# Patient Record
Sex: Male | Born: 1940 | Race: Black or African American | Hispanic: No | Marital: Married | Smoking: Former smoker
Health system: Southern US, Community
[De-identification: ages and names within clinical notes are randomized; demographics above are authoritative.]

## PROBLEM LIST (undated history)

## (undated) DIAGNOSIS — C189 Malignant neoplasm of colon, unspecified: Secondary | ICD-10-CM

## (undated) DIAGNOSIS — C719 Malignant neoplasm of brain, unspecified: Secondary | ICD-10-CM

## (undated) DIAGNOSIS — C3490 Malignant neoplasm of unspecified part of unspecified bronchus or lung: Secondary | ICD-10-CM

## (undated) DIAGNOSIS — C61 Malignant neoplasm of prostate: Secondary | ICD-10-CM

## (undated) DIAGNOSIS — C229 Malignant neoplasm of liver, not specified as primary or secondary: Secondary | ICD-10-CM

## (undated) DIAGNOSIS — N200 Calculus of kidney: Secondary | ICD-10-CM

## (undated) HISTORY — DX: Calculus of kidney: N20.0

## (undated) HISTORY — DX: Malignant neoplasm of liver, not specified as primary or secondary: C22.9

## (undated) HISTORY — DX: Malignant neoplasm of brain, unspecified: C71.9

## (undated) HISTORY — DX: Malignant neoplasm of colon, unspecified: C18.9

## (undated) HISTORY — DX: Malignant neoplasm of prostate: C61

## (undated) HISTORY — DX: Malignant neoplasm of unspecified part of unspecified bronchus or lung: C34.90

---

## 1973-02-15 DIAGNOSIS — N200 Calculus of kidney: Secondary | ICD-10-CM

## 1973-02-15 HISTORY — DX: Calculus of kidney: N20.0

## 1973-02-15 HISTORY — PX: OTHER SURGICAL HISTORY: SHX169

## 2003-02-16 DIAGNOSIS — C61 Malignant neoplasm of prostate: Secondary | ICD-10-CM

## 2003-02-16 HISTORY — DX: Malignant neoplasm of prostate: C61

## 2003-02-16 HISTORY — PX: PROSTATE SURGERY: SHX751

## 2005-07-19 ENCOUNTER — Emergency Department: Payer: Self-pay | Admitting: Emergency Medicine

## 2007-02-01 ENCOUNTER — Ambulatory Visit: Payer: Self-pay | Admitting: Orthopedic Surgery

## 2007-03-10 ENCOUNTER — Ambulatory Visit: Payer: Self-pay | Admitting: Internal Medicine

## 2007-05-02 ENCOUNTER — Ambulatory Visit: Payer: Self-pay | Admitting: Urology

## 2007-07-05 ENCOUNTER — Ambulatory Visit: Payer: Self-pay | Admitting: Internal Medicine

## 2012-02-16 DIAGNOSIS — C3490 Malignant neoplasm of unspecified part of unspecified bronchus or lung: Secondary | ICD-10-CM

## 2012-02-16 HISTORY — DX: Malignant neoplasm of unspecified part of unspecified bronchus or lung: C34.90

## 2012-02-16 HISTORY — PX: PORTACATH PLACEMENT: SHX2246

## 2012-10-02 ENCOUNTER — Ambulatory Visit: Payer: Self-pay | Admitting: Internal Medicine

## 2012-10-05 ENCOUNTER — Ambulatory Visit: Payer: Self-pay | Admitting: Internal Medicine

## 2012-10-13 ENCOUNTER — Ambulatory Visit: Payer: Self-pay | Admitting: Internal Medicine

## 2012-10-25 ENCOUNTER — Ambulatory Visit: Payer: Self-pay | Admitting: Oncology

## 2012-10-30 ENCOUNTER — Ambulatory Visit: Payer: Self-pay | Admitting: Oncology

## 2012-10-30 ENCOUNTER — Ambulatory Visit: Payer: Self-pay | Admitting: Internal Medicine

## 2012-10-30 LAB — COMPREHENSIVE METABOLIC PANEL
Albumin: 3 g/dL — ABNORMAL LOW (ref 3.4–5.0)
Alkaline Phosphatase: 102 U/L (ref 50–136)
Bilirubin,Total: 0.3 mg/dL (ref 0.2–1.0)
Chloride: 103 mmol/L (ref 98–107)
Co2: 31 mmol/L (ref 21–32)
EGFR (African American): >60
Glucose: 121 mg/dL — ABNORMAL HIGH (ref 65–99)
Potassium: 3.5 mmol/L (ref 3.5–5.1)
SGOT(AST): 26 U/L (ref 15–37)
SGPT (ALT): 18 U/L (ref 12–78)
Sodium: 138 mmol/L (ref 136–145)

## 2012-10-30 LAB — CBC CANCER CENTER
Basophil #: 0 x10 3/mm (ref 0.0–0.1)
Basophil %: 0.5 %
Eosinophil #: 0.1 x10 3/mm (ref 0.0–0.7)
HCT: 33.6 % — ABNORMAL LOW (ref 40.0–52.0)
Lymphocyte %: 27 %
MCH: 27.2 pg (ref 26.0–34.0)
MCHC: 32.9 g/dL (ref 32.0–36.0)
MCV: 83 fL (ref 80–100)
Monocyte %: 8.7 %
Platelet: 407 x10 3/mm (ref 150–440)

## 2012-10-30 LAB — LACTATE DEHYDROGENASE: LDH: 411 U/L — ABNORMAL HIGH (ref 85–241)

## 2012-11-07 ENCOUNTER — Ambulatory Visit: Payer: Self-pay | Admitting: Vascular Surgery

## 2012-11-13 LAB — CBC CANCER CENTER
Basophil #: 0 x10 3/mm (ref 0.0–0.1)
Basophil %: 0.8 %
Eosinophil #: 0.1 x10 3/mm (ref 0.0–0.7)
HCT: 32.3 % — ABNORMAL LOW (ref 40.0–52.0)
HGB: 10.7 g/dL — ABNORMAL LOW (ref 13.0–18.0)
Lymphocyte %: 26.5 %
MCH: 27.3 pg (ref 26.0–34.0)
MCHC: 33.2 g/dL (ref 32.0–36.0)
Monocyte #: 0.1 x10 3/mm — ABNORMAL LOW (ref 0.2–1.0)
Monocyte %: 1.5 %
Neutrophil #: 3.8 x10 3/mm (ref 1.4–6.5)
Neutrophil %: 69.2 %
Platelet: 300 x10 3/mm (ref 150–440)
RBC: 3.94 10*6/uL — ABNORMAL LOW (ref 4.40–5.90)
WBC: 5.4 x10 3/mm (ref 3.8–10.6)

## 2012-11-13 LAB — CREATININE, SERUM: Creatinine: 0.79 mg/dL (ref 0.60–1.30)

## 2012-11-15 ENCOUNTER — Ambulatory Visit: Payer: Self-pay | Admitting: Oncology

## 2012-11-20 LAB — CBC CANCER CENTER
Eosinophil #: 0 x10 3/mm (ref 0.0–0.7)
Eosinophil %: 0.3 %
HCT: 30.1 % — ABNORMAL LOW (ref 40.0–52.0)
HGB: 10.1 g/dL — ABNORMAL LOW (ref 13.0–18.0)
Lymphocyte #: 1.4 x10 3/mm (ref 1.0–3.6)
Lymphocyte %: 61.8 %
MCHC: 33.7 g/dL (ref 32.0–36.0)
Monocyte %: 13 %
Platelet: 280 x10 3/mm (ref 150–440)
RDW: 14.1 % (ref 11.5–14.5)

## 2012-11-27 LAB — COMPREHENSIVE METABOLIC PANEL
Anion Gap: 11 (ref 7–16)
Bilirubin,Total: 0.3 mg/dL (ref 0.2–1.0)
Calcium, Total: 9.2 mg/dL (ref 8.5–10.1)
Co2: 27 mmol/L (ref 21–32)
EGFR (African American): >60
Glucose: 131 mg/dL — ABNORMAL HIGH (ref 65–99)
Osmolality: 275 (ref 275–301)
SGPT (ALT): 26 U/L (ref 12–78)
Sodium: 136 mmol/L (ref 136–145)

## 2012-11-27 LAB — CBC CANCER CENTER
Basophil #: 0 x10 3/mm (ref 0.0–0.1)
Basophil %: 0.1 %
HCT: 33.9 % — ABNORMAL LOW (ref 40.0–52.0)
HGB: 11.5 g/dL — ABNORMAL LOW (ref 13.0–18.0)
Lymphocyte %: 14.2 %
MCHC: 33.8 g/dL (ref 32.0–36.0)
MCV: 84 fL (ref 80–100)
Monocyte #: 0.4 x10 3/mm (ref 0.2–1.0)
Neutrophil #: 5.7 x10 3/mm (ref 1.4–6.5)
Platelet: 391 x10 3/mm (ref 150–440)
RDW: 15.1 % — ABNORMAL HIGH (ref 11.5–14.5)

## 2012-12-04 LAB — CBC CANCER CENTER
Basophil #: 0 x10 3/mm (ref 0.0–0.1)
HCT: 31.4 % — ABNORMAL LOW (ref 40.0–52.0)
HGB: 10.6 g/dL — ABNORMAL LOW (ref 13.0–18.0)
Lymphocyte #: 0.5 x10 3/mm — ABNORMAL LOW (ref 1.0–3.6)
MCH: 28.7 pg (ref 26.0–34.0)
MCHC: 33.7 g/dL (ref 32.0–36.0)
Monocyte #: 0.1 x10 3/mm — ABNORMAL LOW (ref 0.2–1.0)
Neutrophil %: 89 %
RBC: 3.68 10*6/uL — ABNORMAL LOW (ref 4.40–5.90)
RDW: 16 % — ABNORMAL HIGH (ref 11.5–14.5)
WBC: 6.2 x10 3/mm (ref 3.8–10.6)

## 2012-12-11 LAB — CBC CANCER CENTER
Basophil %: 0.5 %
Eosinophil %: 0.4 %
HGB: 10.6 g/dL — ABNORMAL LOW (ref 13.0–18.0)
Lymphocyte #: 0.4 x10 3/mm — ABNORMAL LOW (ref 1.0–3.6)
MCH: 28.8 pg (ref 26.0–34.0)
MCHC: 33.6 g/dL (ref 32.0–36.0)
Monocyte #: 0.2 x10 3/mm (ref 0.2–1.0)
Monocyte %: 12.4 %
Neutrophil %: 58.2 %
RBC: 3.67 10*6/uL — ABNORMAL LOW (ref 4.40–5.90)
RDW: 17.2 % — ABNORMAL HIGH (ref 11.5–14.5)
WBC: 1.5 x10 3/mm — CL (ref 3.8–10.6)

## 2012-12-16 ENCOUNTER — Ambulatory Visit: Payer: Self-pay | Admitting: Oncology

## 2012-12-27 LAB — COMPREHENSIVE METABOLIC PANEL
Albumin: 3.1 g/dL — ABNORMAL LOW (ref 3.4–5.0)
Alkaline Phosphatase: 92 U/L (ref 50–136)
Bilirubin,Total: 0.3 mg/dL (ref 0.2–1.0)
Creatinine: 1.01 mg/dL (ref 0.60–1.30)
EGFR (African American): >60
EGFR (Non-African Amer.): >60
Potassium: 4.3 mmol/L (ref 3.5–5.1)
SGOT(AST): 23 U/L (ref 15–37)
SGPT (ALT): 29 U/L (ref 12–78)
Sodium: 142 mmol/L (ref 136–145)

## 2012-12-27 LAB — CBC CANCER CENTER
Basophil #: 0 x10 3/mm (ref 0.0–0.1)
Eosinophil #: 0 x10 3/mm (ref 0.0–0.7)
Eosinophil %: 0.6 %
HCT: 33.4 % — ABNORMAL LOW (ref 40.0–52.0)
HGB: 11.1 g/dL — ABNORMAL LOW (ref 13.0–18.0)
Lymphocyte #: 0.5 x10 3/mm — ABNORMAL LOW (ref 1.0–3.6)
Lymphocyte %: 11.3 %
MCH: 28.7 pg (ref 26.0–34.0)
MCHC: 33.1 g/dL (ref 32.0–36.0)
Monocyte #: 0.9 x10 3/mm (ref 0.2–1.0)
Monocyte %: 19.6 %
Neutrophil #: 3.3 x10 3/mm (ref 1.4–6.5)
Neutrophil %: 68.2 %
RBC: 3.86 10*6/uL — ABNORMAL LOW (ref 4.40–5.90)
RDW: 20.7 % — ABNORMAL HIGH (ref 11.5–14.5)
WBC: 4.8 x10 3/mm (ref 3.8–10.6)

## 2013-01-03 LAB — CBC CANCER CENTER
Basophil %: 0.5 %
Eosinophil #: 0.1 x10 3/mm (ref 0.0–0.7)
HCT: 36 % — ABNORMAL LOW (ref 40.0–52.0)
Lymphocyte #: 0.5 x10 3/mm — ABNORMAL LOW (ref 1.0–3.6)
MCH: 28.1 pg (ref 26.0–34.0)
MCHC: 33 g/dL (ref 32.0–36.0)
MCV: 85 fL (ref 80–100)
Monocyte %: 2.2 %
Neutrophil #: 3.2 x10 3/mm (ref 1.4–6.5)
Neutrophil %: 83.5 %
Platelet: 165 x10 3/mm (ref 150–440)
RBC: 4.22 10*6/uL — ABNORMAL LOW (ref 4.40–5.90)
RDW: 19.8 % — ABNORMAL HIGH (ref 11.5–14.5)

## 2013-01-10 LAB — CBC CANCER CENTER
Basophil %: 0.4 %
Eosinophil #: 0.1 x10 3/mm (ref 0.0–0.7)
HGB: 9.6 g/dL — ABNORMAL LOW (ref 13.0–18.0)
Lymphocyte #: 0.5 x10 3/mm — ABNORMAL LOW (ref 1.0–3.6)
Lymphocyte %: 41.9 %
MCHC: 33.6 g/dL (ref 32.0–36.0)
MCV: 85 fL (ref 80–100)
Monocyte #: 0.2 x10 3/mm (ref 0.2–1.0)
Neutrophil #: 0.4 x10 3/mm — ABNORMAL LOW (ref 1.4–6.5)
Neutrophil %: 33.1 %
Platelet: 78 x10 3/mm — ABNORMAL LOW (ref 150–440)
RDW: 19.4 % — ABNORMAL HIGH (ref 11.5–14.5)
WBC: 1.2 x10 3/mm — CL (ref 3.8–10.6)

## 2013-01-15 ENCOUNTER — Ambulatory Visit: Payer: Self-pay | Admitting: Oncology

## 2013-01-17 LAB — CBC CANCER CENTER
Basophil %: 0.1 %
Eosinophil #: 0 x10 3/mm (ref 0.0–0.7)
HGB: 10.2 g/dL — ABNORMAL LOW (ref 13.0–18.0)
MCH: 29.3 pg (ref 26.0–34.0)
MCHC: 34.5 g/dL (ref 32.0–36.0)
Monocyte %: 14.9 %
Neutrophil %: 68.1 %
Platelet: 119 x10 3/mm — ABNORMAL LOW (ref 150–440)
RBC: 3.48 10*6/uL — ABNORMAL LOW (ref 4.40–5.90)
RDW: 20.2 % — ABNORMAL HIGH (ref 11.5–14.5)
WBC: 3.8 x10 3/mm (ref 3.8–10.6)

## 2013-01-17 LAB — COMPREHENSIVE METABOLIC PANEL
Albumin: 3.5 g/dL (ref 3.4–5.0)
BUN: 11 mg/dL (ref 7–18)
Bilirubin,Total: 0.4 mg/dL (ref 0.2–1.0)
Co2: 29 mmol/L (ref 21–32)
EGFR (African American): >60
EGFR (Non-African Amer.): >60
Osmolality: 278 (ref 275–301)
Potassium: 4 mmol/L (ref 3.5–5.1)
SGOT(AST): 18 U/L (ref 15–37)
SGPT (ALT): 22 U/L (ref 12–78)

## 2013-02-15 ENCOUNTER — Ambulatory Visit: Payer: Self-pay | Admitting: Oncology

## 2013-02-21 LAB — COMPREHENSIVE METABOLIC PANEL
ALBUMIN: 3.6 g/dL (ref 3.4–5.0)
ALT: 20 U/L (ref 12–78)
Alkaline Phosphatase: 72 U/L
Anion Gap: 8 (ref 7–16)
BILIRUBIN TOTAL: 0.3 mg/dL (ref 0.2–1.0)
BUN: 15 mg/dL (ref 7–18)
Calcium, Total: 9.2 mg/dL (ref 8.5–10.1)
Chloride: 107 mmol/L (ref 98–107)
Co2: 26 mmol/L (ref 21–32)
Creatinine: 0.84 mg/dL (ref 0.60–1.30)
EGFR (African American): >60
EGFR (Non-African Amer.): >60
Glucose: 89 mg/dL (ref 65–99)
OSMOLALITY: 282 (ref 275–301)
Potassium: 4.1 mmol/L (ref 3.5–5.1)
SGOT(AST): 17 U/L (ref 15–37)
SODIUM: 141 mmol/L (ref 136–145)
Total Protein: 7.2 g/dL (ref 6.4–8.2)

## 2013-02-21 LAB — CBC CANCER CENTER
Basophil #: 0 x10 3/mm (ref 0.0–0.1)
Basophil %: 0.3 %
Eosinophil #: 0 x10 3/mm (ref 0.0–0.7)
Eosinophil %: 0.7 %
HCT: 27.8 % — ABNORMAL LOW (ref 40.0–52.0)
HGB: 9.2 g/dL — AB (ref 13.0–18.0)
LYMPHS ABS: 0.6 x10 3/mm — AB (ref 1.0–3.6)
Lymphocyte %: 11.2 %
MCH: 30.5 pg (ref 26.0–34.0)
MCHC: 33.2 g/dL (ref 32.0–36.0)
MCV: 92 fL (ref 80–100)
MONO ABS: 0.6 x10 3/mm (ref 0.2–1.0)
Monocyte %: 11.9 %
Neutrophil #: 3.9 x10 3/mm (ref 1.4–6.5)
Neutrophil %: 75.9 %
Platelet: 278 x10 3/mm (ref 150–440)
RBC: 3.03 10*6/uL — ABNORMAL LOW (ref 4.40–5.90)
RDW: 23.2 % — AB (ref 11.5–14.5)
WBC: 5.1 x10 3/mm (ref 3.8–10.6)

## 2013-03-07 LAB — CBC CANCER CENTER
Basophil #: 0 x10 3/mm (ref 0.0–0.1)
Basophil %: 0.6 %
EOS ABS: 0 x10 3/mm (ref 0.0–0.7)
EOS PCT: 2.2 %
HCT: 29 % — AB (ref 40.0–52.0)
HGB: 9.8 g/dL — AB (ref 13.0–18.0)
LYMPHS PCT: 22.8 %
Lymphocyte #: 0.4 x10 3/mm — ABNORMAL LOW (ref 1.0–3.6)
MCH: 30.6 pg (ref 26.0–34.0)
MCHC: 33.7 g/dL (ref 32.0–36.0)
MCV: 91 fL (ref 80–100)
Monocyte #: 0.4 x10 3/mm (ref 0.2–1.0)
Monocyte %: 20.8 %
NEUTROS ABS: 1 x10 3/mm — AB (ref 1.4–6.5)
Neutrophil %: 53.6 %
PLATELETS: 139 x10 3/mm — AB (ref 150–440)
RBC: 3.19 10*6/uL — AB (ref 4.40–5.90)
RDW: 20 % — AB (ref 11.5–14.5)
WBC: 1.8 x10 3/mm — AB (ref 3.8–10.6)

## 2013-03-14 LAB — CBC CANCER CENTER
Basophil #: 0 x10 3/mm (ref 0.0–0.1)
Basophil %: 0.3 %
EOS ABS: 0 x10 3/mm (ref 0.0–0.7)
Eosinophil %: 1.3 %
HCT: 27.3 % — ABNORMAL LOW (ref 40.0–52.0)
HGB: 9.2 g/dL — AB (ref 13.0–18.0)
LYMPHS PCT: 17 %
Lymphocyte #: 0.6 x10 3/mm — ABNORMAL LOW (ref 1.0–3.6)
MCH: 30.9 pg (ref 26.0–34.0)
MCHC: 33.7 g/dL (ref 32.0–36.0)
MCV: 92 fL (ref 80–100)
MONOS PCT: 15.2 %
Monocyte #: 0.6 x10 3/mm (ref 0.2–1.0)
Neutrophil #: 2.4 x10 3/mm (ref 1.4–6.5)
Neutrophil %: 66.2 %
Platelet: 171 x10 3/mm (ref 150–440)
RBC: 2.97 10*6/uL — ABNORMAL LOW (ref 4.40–5.90)
RDW: 19 % — ABNORMAL HIGH (ref 11.5–14.5)
WBC: 3.6 x10 3/mm — ABNORMAL LOW (ref 3.8–10.6)

## 2013-03-14 LAB — COMPREHENSIVE METABOLIC PANEL
ANION GAP: 9 (ref 7–16)
Albumin: 3.3 g/dL — ABNORMAL LOW (ref 3.4–5.0)
Alkaline Phosphatase: 75 U/L
BILIRUBIN TOTAL: 0.4 mg/dL (ref 0.2–1.0)
BUN: 13 mg/dL (ref 7–18)
CALCIUM: 8.5 mg/dL (ref 8.5–10.1)
CO2: 26 mmol/L (ref 21–32)
Chloride: 106 mmol/L (ref 98–107)
Creatinine: 0.98 mg/dL (ref 0.60–1.30)
EGFR (African American): >60
GLUCOSE: 142 mg/dL — AB (ref 65–99)
Osmolality: 284 (ref 275–301)
POTASSIUM: 3.6 mmol/L (ref 3.5–5.1)
SGOT(AST): 10 U/L — ABNORMAL LOW (ref 15–37)
SGPT (ALT): 16 U/L (ref 12–78)
Sodium: 141 mmol/L (ref 136–145)
TOTAL PROTEIN: 7.6 g/dL (ref 6.4–8.2)

## 2013-03-18 ENCOUNTER — Ambulatory Visit: Payer: Self-pay | Admitting: Oncology

## 2013-04-15 ENCOUNTER — Ambulatory Visit: Payer: Self-pay | Admitting: Oncology

## 2013-05-30 ENCOUNTER — Ambulatory Visit: Payer: Self-pay | Admitting: Oncology

## 2013-05-31 LAB — CBC CANCER CENTER
BASOS PCT: 0.4 %
Basophil #: 0 x10 3/mm (ref 0.0–0.1)
Eosinophil #: 0.1 x10 3/mm (ref 0.0–0.7)
Eosinophil %: 2.9 %
HCT: 33.8 % — AB (ref 40.0–52.0)
HGB: 10.9 g/dL — ABNORMAL LOW (ref 13.0–18.0)
LYMPHS ABS: 1.2 x10 3/mm (ref 1.0–3.6)
Lymphocyte %: 27.6 %
MCH: 28.4 pg (ref 26.0–34.0)
MCHC: 32.3 g/dL (ref 32.0–36.0)
MCV: 88 fL (ref 80–100)
Monocyte #: 0.6 x10 3/mm (ref 0.2–1.0)
Monocyte %: 12.6 %
Neutrophil #: 2.5 x10 3/mm (ref 1.4–6.5)
Neutrophil %: 56.5 %
Platelet: 218 x10 3/mm (ref 150–440)
RBC: 3.85 10*6/uL — ABNORMAL LOW (ref 4.40–5.90)
RDW: 14.1 % (ref 11.5–14.5)
WBC: 4.5 x10 3/mm (ref 3.8–10.6)

## 2013-05-31 LAB — COMPREHENSIVE METABOLIC PANEL
ALBUMIN: 3.4 g/dL (ref 3.4–5.0)
Alkaline Phosphatase: 89 U/L
Anion Gap: 7 (ref 7–16)
BUN: 7 mg/dL (ref 7–18)
Bilirubin,Total: 0.2 mg/dL (ref 0.2–1.0)
CALCIUM: 9.4 mg/dL (ref 8.5–10.1)
CO2: 29 mmol/L (ref 21–32)
CREATININE: 0.91 mg/dL (ref 0.60–1.30)
Chloride: 105 mmol/L (ref 98–107)
EGFR (Non-African Amer.): >60
Glucose: 85 mg/dL (ref 65–99)
Osmolality: 278 (ref 275–301)
Potassium: 4.4 mmol/L (ref 3.5–5.1)
SGOT(AST): 13 U/L — ABNORMAL LOW (ref 15–37)
SGPT (ALT): 11 U/L — ABNORMAL LOW (ref 12–78)
Sodium: 141 mmol/L (ref 136–145)
Total Protein: 7.7 g/dL (ref 6.4–8.2)

## 2013-06-15 ENCOUNTER — Ambulatory Visit: Payer: Self-pay | Admitting: Oncology

## 2013-10-01 ENCOUNTER — Ambulatory Visit: Payer: Self-pay | Admitting: Oncology

## 2013-10-04 ENCOUNTER — Ambulatory Visit: Payer: Self-pay | Admitting: Oncology

## 2013-10-04 LAB — COMPREHENSIVE METABOLIC PANEL
ALBUMIN: 3.2 g/dL — AB (ref 3.4–5.0)
ALT: 18 U/L
Alkaline Phosphatase: 92 U/L
Anion Gap: 10 (ref 7–16)
BUN: 8 mg/dL (ref 7–18)
Bilirubin,Total: 0.4 mg/dL (ref 0.2–1.0)
CALCIUM: 8.8 mg/dL (ref 8.5–10.1)
CO2: 27 mmol/L (ref 21–32)
CREATININE: 1.04 mg/dL (ref 0.60–1.30)
Chloride: 104 mmol/L (ref 98–107)
EGFR (African American): >60
EGFR (Non-African Amer.): >60
Glucose: 89 mg/dL (ref 65–99)
Osmolality: 279 (ref 275–301)
Potassium: 3.5 mmol/L (ref 3.5–5.1)
SGOT(AST): 21 U/L (ref 15–37)
Sodium: 141 mmol/L (ref 136–145)
Total Protein: 7.4 g/dL (ref 6.4–8.2)

## 2013-10-04 LAB — CBC CANCER CENTER
BASOS PCT: 0.5 %
Basophil #: 0 x10 3/mm (ref 0.0–0.1)
EOS PCT: 3 %
Eosinophil #: 0.2 x10 3/mm (ref 0.0–0.7)
HCT: 36 % — ABNORMAL LOW (ref 40.0–52.0)
HGB: 11.6 g/dL — AB (ref 13.0–18.0)
LYMPHS PCT: 25 %
Lymphocyte #: 1.5 x10 3/mm (ref 1.0–3.6)
MCH: 27.7 pg (ref 26.0–34.0)
MCHC: 32.4 g/dL (ref 32.0–36.0)
MCV: 86 fL (ref 80–100)
MONOS PCT: 8.4 %
Monocyte #: 0.5 x10 3/mm (ref 0.2–1.0)
NEUTROS PCT: 63.1 %
Neutrophil #: 3.8 x10 3/mm (ref 1.4–6.5)
Platelet: 242 x10 3/mm (ref 150–440)
RBC: 4.21 10*6/uL — AB (ref 4.40–5.90)
RDW: 16.2 % — ABNORMAL HIGH (ref 11.5–14.5)
WBC: 6 x10 3/mm (ref 3.8–10.6)

## 2013-10-16 ENCOUNTER — Ambulatory Visit: Payer: Self-pay | Admitting: Oncology

## 2013-11-01 ENCOUNTER — Ambulatory Visit: Payer: Self-pay | Admitting: Oncology

## 2013-11-14 LAB — CBC CANCER CENTER
Basophil #: 0 x10 3/mm (ref 0.0–0.1)
Basophil %: 0.3 %
EOS ABS: 0.2 x10 3/mm (ref 0.0–0.7)
Eosinophil %: 2.6 %
HCT: 34.3 % — AB (ref 40.0–52.0)
HGB: 10.9 g/dL — AB (ref 13.0–18.0)
LYMPHS ABS: 1.1 x10 3/mm (ref 1.0–3.6)
Lymphocyte %: 18.9 %
MCH: 27.4 pg (ref 26.0–34.0)
MCHC: 31.8 g/dL — ABNORMAL LOW (ref 32.0–36.0)
MCV: 86 fL (ref 80–100)
MONO ABS: 0.6 x10 3/mm (ref 0.2–1.0)
Monocyte %: 9.6 %
NEUTROS PCT: 68.6 %
Neutrophil #: 4.1 x10 3/mm (ref 1.4–6.5)
PLATELETS: 266 x10 3/mm (ref 150–440)
RBC: 3.98 10*6/uL — AB (ref 4.40–5.90)
RDW: 15.7 % — ABNORMAL HIGH (ref 11.5–14.5)
WBC: 5.9 x10 3/mm (ref 3.8–10.6)

## 2013-11-14 LAB — COMPREHENSIVE METABOLIC PANEL
ALBUMIN: 3.4 g/dL (ref 3.4–5.0)
ALK PHOS: 101 U/L
ANION GAP: 7 (ref 7–16)
BUN: 10 mg/dL (ref 7–18)
Bilirubin,Total: 0.3 mg/dL (ref 0.2–1.0)
CALCIUM: 9.1 mg/dL (ref 8.5–10.1)
CO2: 29 mmol/L (ref 21–32)
Chloride: 104 mmol/L (ref 98–107)
Creatinine: 1.09 mg/dL (ref 0.60–1.30)
EGFR (Non-African Amer.): >60
Glucose: 118 mg/dL — ABNORMAL HIGH (ref 65–99)
OSMOLALITY: 280 (ref 275–301)
Potassium: 3.6 mmol/L (ref 3.5–5.1)
SGOT(AST): 22 U/L (ref 15–37)
SGPT (ALT): 23 U/L
Sodium: 140 mmol/L (ref 136–145)
Total Protein: 7.2 g/dL (ref 6.4–8.2)

## 2013-11-15 ENCOUNTER — Ambulatory Visit: Payer: Self-pay | Admitting: Oncology

## 2013-11-21 LAB — CBC CANCER CENTER
Basophil #: 0 x10 3/mm (ref 0.0–0.1)
Basophil %: 0.2 %
Eosinophil #: 0.1 x10 3/mm (ref 0.0–0.7)
Eosinophil %: 2.1 %
HCT: 34.7 % — ABNORMAL LOW (ref 40.0–52.0)
HGB: 11.3 g/dL — AB (ref 13.0–18.0)
LYMPHS ABS: 1.5 x10 3/mm (ref 1.0–3.6)
Lymphocyte %: 27.6 %
MCH: 27.9 pg (ref 26.0–34.0)
MCHC: 32.6 g/dL (ref 32.0–36.0)
MCV: 86 fL (ref 80–100)
Monocyte #: 0.4 x10 3/mm (ref 0.2–1.0)
Monocyte %: 7.8 %
NEUTROS ABS: 3.5 x10 3/mm (ref 1.4–6.5)
Neutrophil %: 62.3 %
PLATELETS: 247 x10 3/mm (ref 150–440)
RBC: 4.05 10*6/uL — AB (ref 4.40–5.90)
RDW: 15.6 % — ABNORMAL HIGH (ref 11.5–14.5)
WBC: 5.6 x10 3/mm (ref 3.8–10.6)

## 2013-11-28 LAB — CBC CANCER CENTER
Basophil #: 0 x10 3/mm (ref 0.0–0.1)
Basophil %: 0.2 %
EOS ABS: 0.1 x10 3/mm (ref 0.0–0.7)
Eosinophil %: 3.6 %
HCT: 34.3 % — ABNORMAL LOW (ref 40.0–52.0)
HGB: 11.2 g/dL — AB (ref 13.0–18.0)
LYMPHS ABS: 1.4 x10 3/mm (ref 1.0–3.6)
Lymphocyte %: 34.3 %
MCH: 28 pg (ref 26.0–34.0)
MCHC: 32.7 g/dL (ref 32.0–36.0)
MCV: 86 fL (ref 80–100)
MONO ABS: 0.4 x10 3/mm (ref 0.2–1.0)
MONOS PCT: 9.5 %
NEUTROS PCT: 52.4 %
Neutrophil #: 2.2 x10 3/mm (ref 1.4–6.5)
Platelet: 212 x10 3/mm (ref 150–440)
RBC: 4 10*6/uL — ABNORMAL LOW (ref 4.40–5.90)
RDW: 15.3 % — ABNORMAL HIGH (ref 11.5–14.5)
WBC: 4.1 x10 3/mm (ref 3.8–10.6)

## 2013-12-05 LAB — CBC CANCER CENTER
Basophil #: 0 x10 3/mm (ref 0.0–0.1)
Basophil %: 0.4 %
Eosinophil #: 0.3 x10 3/mm (ref 0.0–0.7)
Eosinophil %: 8.3 %
HCT: 35.3 % — ABNORMAL LOW (ref 40.0–52.0)
HGB: 11.5 g/dL — ABNORMAL LOW (ref 13.0–18.0)
LYMPHS PCT: 37.2 %
Lymphocyte #: 1.4 x10 3/mm (ref 1.0–3.6)
MCH: 28 pg (ref 26.0–34.0)
MCHC: 32.6 g/dL (ref 32.0–36.0)
MCV: 86 fL (ref 80–100)
MONO ABS: 0.4 x10 3/mm (ref 0.2–1.0)
Monocyte %: 10.5 %
Neutrophil #: 1.7 x10 3/mm (ref 1.4–6.5)
Neutrophil %: 43.6 %
Platelet: 135 x10 3/mm — ABNORMAL LOW (ref 150–440)
RBC: 4.12 10*6/uL — ABNORMAL LOW (ref 4.40–5.90)
RDW: 15.2 % — AB (ref 11.5–14.5)
WBC: 3.8 x10 3/mm (ref 3.8–10.6)

## 2013-12-05 LAB — MAGNESIUM: MAGNESIUM: 1.9 mg/dL

## 2013-12-05 LAB — COMPREHENSIVE METABOLIC PANEL
ALBUMIN: 3.5 g/dL (ref 3.4–5.0)
ALK PHOS: 106 U/L
AST: 20 U/L (ref 15–37)
Anion Gap: 8 (ref 7–16)
BILIRUBIN TOTAL: 0.5 mg/dL (ref 0.2–1.0)
BUN: 13 mg/dL (ref 7–18)
CREATININE: 1.1 mg/dL (ref 0.60–1.30)
Calcium, Total: 9.7 mg/dL (ref 8.5–10.1)
Chloride: 103 mmol/L (ref 98–107)
Co2: 27 mmol/L (ref 21–32)
EGFR (Non-African Amer.): >60
GLUCOSE: 101 mg/dL — AB (ref 65–99)
Osmolality: 276 (ref 275–301)
POTASSIUM: 3.9 mmol/L (ref 3.5–5.1)
SGPT (ALT): 23 U/L
SODIUM: 138 mmol/L (ref 136–145)
Total Protein: 7.6 g/dL (ref 6.4–8.2)

## 2013-12-12 LAB — CBC CANCER CENTER
BASOS ABS: 0 x10 3/mm (ref 0.0–0.1)
Basophil %: 0.2 %
EOS PCT: 2.3 %
Eosinophil #: 0.1 x10 3/mm (ref 0.0–0.7)
HCT: 32.9 % — ABNORMAL LOW (ref 40.0–52.0)
HGB: 10.8 g/dL — AB (ref 13.0–18.0)
LYMPHS ABS: 1.1 x10 3/mm (ref 1.0–3.6)
Lymphocyte %: 43.2 %
MCH: 28.1 pg (ref 26.0–34.0)
MCHC: 32.7 g/dL (ref 32.0–36.0)
MCV: 86 fL (ref 80–100)
MONOS PCT: 13.1 %
Monocyte #: 0.3 x10 3/mm (ref 0.2–1.0)
Neutrophil #: 1.1 x10 3/mm — ABNORMAL LOW (ref 1.4–6.5)
Neutrophil %: 41.2 %
PLATELETS: 115 x10 3/mm — AB (ref 150–440)
RBC: 3.84 10*6/uL — ABNORMAL LOW (ref 4.40–5.90)
RDW: 15.3 % — AB (ref 11.5–14.5)
WBC: 2.6 x10 3/mm — ABNORMAL LOW (ref 3.8–10.6)

## 2013-12-12 LAB — BASIC METABOLIC PANEL
Anion Gap: 8 (ref 7–16)
BUN: 13 mg/dL (ref 7–18)
CHLORIDE: 105 mmol/L (ref 98–107)
Calcium, Total: 9 mg/dL (ref 8.5–10.1)
Co2: 27 mmol/L (ref 21–32)
Creatinine: 0.93 mg/dL (ref 0.60–1.30)
EGFR (Non-African Amer.): >60
Glucose: 95 mg/dL (ref 65–99)
Osmolality: 279 (ref 275–301)
Potassium: 3.9 mmol/L (ref 3.5–5.1)
Sodium: 140 mmol/L (ref 136–145)

## 2013-12-16 ENCOUNTER — Ambulatory Visit: Payer: Self-pay | Admitting: Oncology

## 2013-12-26 LAB — COMPREHENSIVE METABOLIC PANEL
ANION GAP: 8 (ref 7–16)
Albumin: 3.3 g/dL — ABNORMAL LOW (ref 3.4–5.0)
Alkaline Phosphatase: 99 U/L
BILIRUBIN TOTAL: 0.3 mg/dL (ref 0.2–1.0)
BUN: 6 mg/dL — AB (ref 7–18)
CREATININE: 0.9 mg/dL (ref 0.60–1.30)
Calcium, Total: 9 mg/dL (ref 8.5–10.1)
Chloride: 105 mmol/L (ref 98–107)
Co2: 29 mmol/L (ref 21–32)
EGFR (African American): >60
Glucose: 90 mg/dL (ref 65–99)
OSMOLALITY: 280 (ref 275–301)
POTASSIUM: 3.8 mmol/L (ref 3.5–5.1)
SGOT(AST): 19 U/L (ref 15–37)
SGPT (ALT): 21 U/L
Sodium: 142 mmol/L (ref 136–145)
TOTAL PROTEIN: 6.9 g/dL (ref 6.4–8.2)

## 2013-12-26 LAB — CBC CANCER CENTER
BASOS PCT: 0.4 %
Basophil #: 0 x10 3/mm (ref 0.0–0.1)
EOS ABS: 0.1 x10 3/mm (ref 0.0–0.7)
Eosinophil %: 2.5 %
HCT: 31.4 % — ABNORMAL LOW (ref 40.0–52.0)
HGB: 10.3 g/dL — ABNORMAL LOW (ref 13.0–18.0)
LYMPHS PCT: 38 %
Lymphocyte #: 1.3 x10 3/mm (ref 1.0–3.6)
MCH: 28.5 pg (ref 26.0–34.0)
MCHC: 32.9 g/dL (ref 32.0–36.0)
MCV: 87 fL (ref 80–100)
MONO ABS: 0.5 x10 3/mm (ref 0.2–1.0)
MONOS PCT: 13.9 %
NEUTROS PCT: 45.2 %
Neutrophil #: 1.6 x10 3/mm (ref 1.4–6.5)
Platelet: 115 x10 3/mm — ABNORMAL LOW (ref 150–440)
RBC: 3.63 10*6/uL — AB (ref 4.40–5.90)
RDW: 16 % — ABNORMAL HIGH (ref 11.5–14.5)
WBC: 3.5 x10 3/mm — ABNORMAL LOW (ref 3.8–10.6)

## 2014-01-02 LAB — CBC CANCER CENTER
Basophil #: 0 x10 3/mm (ref 0.0–0.1)
Basophil %: 0.2 %
EOS PCT: 1.5 %
Eosinophil #: 0 x10 3/mm (ref 0.0–0.7)
HCT: 32.8 % — ABNORMAL LOW (ref 40.0–52.0)
HGB: 10.8 g/dL — AB (ref 13.0–18.0)
LYMPHS ABS: 1 x10 3/mm (ref 1.0–3.6)
Lymphocyte %: 34 %
MCH: 28.5 pg (ref 26.0–34.0)
MCHC: 32.8 g/dL (ref 32.0–36.0)
MCV: 87 fL (ref 80–100)
Monocyte #: 0.4 x10 3/mm (ref 0.2–1.0)
Monocyte %: 11.6 %
Neutrophil #: 1.6 x10 3/mm (ref 1.4–6.5)
Neutrophil %: 52.7 %
PLATELETS: 92 x10 3/mm — AB (ref 150–440)
RBC: 3.77 10*6/uL — ABNORMAL LOW (ref 4.40–5.90)
RDW: 17 % — AB (ref 11.5–14.5)
WBC: 3 x10 3/mm — ABNORMAL LOW (ref 3.8–10.6)

## 2014-01-14 LAB — BASIC METABOLIC PANEL
Anion Gap: 8 (ref 7–16)
BUN: 9 mg/dL (ref 7–18)
CREATININE: 0.78 mg/dL (ref 0.60–1.30)
Calcium, Total: 9.3 mg/dL (ref 8.5–10.1)
Chloride: 106 mmol/L (ref 98–107)
Co2: 27 mmol/L (ref 21–32)
Glucose: 82 mg/dL (ref 65–99)
Osmolality: 279 (ref 275–301)
Potassium: 4 mmol/L (ref 3.5–5.1)
Sodium: 141 mmol/L (ref 136–145)

## 2014-01-14 LAB — CBC CANCER CENTER
BASOS PCT: 0.5 %
Basophil #: 0 x10 3/mm (ref 0.0–0.1)
EOS ABS: 0.1 x10 3/mm (ref 0.0–0.7)
Eosinophil %: 4.1 %
HCT: 27.8 % — ABNORMAL LOW (ref 40.0–52.0)
HGB: 9.5 g/dL — ABNORMAL LOW (ref 13.0–18.0)
Lymphocyte #: 1 x10 3/mm (ref 1.0–3.6)
Lymphocyte %: 39.4 %
MCH: 29.8 pg (ref 26.0–34.0)
MCHC: 34 g/dL (ref 32.0–36.0)
MCV: 88 fL (ref 80–100)
MONOS PCT: 13.1 %
Monocyte #: 0.3 x10 3/mm (ref 0.2–1.0)
NEUTROS ABS: 1.1 x10 3/mm — AB (ref 1.4–6.5)
NEUTROS PCT: 42.9 %
Platelet: 87 x10 3/mm — ABNORMAL LOW (ref 150–440)
RBC: 3.18 10*6/uL — ABNORMAL LOW (ref 4.40–5.90)
RDW: 18.2 % — AB (ref 11.5–14.5)
WBC: 2.5 x10 3/mm — AB (ref 3.8–10.6)

## 2014-01-14 LAB — MAGNESIUM: Magnesium: 1.8 mg/dL

## 2014-01-15 ENCOUNTER — Ambulatory Visit: Payer: Self-pay | Admitting: Oncology

## 2014-01-15 DIAGNOSIS — C189 Malignant neoplasm of colon, unspecified: Secondary | ICD-10-CM

## 2014-01-15 DIAGNOSIS — C719 Malignant neoplasm of brain, unspecified: Secondary | ICD-10-CM

## 2014-01-15 HISTORY — DX: Malignant neoplasm of colon, unspecified: C18.9

## 2014-01-15 HISTORY — DX: Malignant neoplasm of brain, unspecified: C71.9

## 2014-01-21 LAB — CBC CANCER CENTER
BASOS ABS: 0 x10 3/mm (ref 0.0–0.1)
BASOS PCT: 0.1 %
Eosinophil #: 0.1 x10 3/mm (ref 0.0–0.7)
Eosinophil %: 3.8 %
HCT: 29.7 % — ABNORMAL LOW (ref 40.0–52.0)
HGB: 10 g/dL — ABNORMAL LOW (ref 13.0–18.0)
LYMPHS ABS: 1.3 x10 3/mm (ref 1.0–3.6)
Lymphocyte %: 37 %
MCH: 29.5 pg (ref 26.0–34.0)
MCHC: 33.7 g/dL (ref 32.0–36.0)
MCV: 88 fL (ref 80–100)
MONO ABS: 0.5 x10 3/mm (ref 0.2–1.0)
Monocyte %: 14.5 %
NEUTROS ABS: 1.5 x10 3/mm (ref 1.4–6.5)
Neutrophil %: 44.6 %
Platelet: 70 x10 3/mm — ABNORMAL LOW (ref 150–440)
RBC: 3.4 10*6/uL — ABNORMAL LOW (ref 4.40–5.90)
RDW: 21 % — AB (ref 11.5–14.5)
WBC: 3.4 x10 3/mm — ABNORMAL LOW (ref 3.8–10.6)

## 2014-01-21 LAB — COMPREHENSIVE METABOLIC PANEL
ALBUMIN: 3.6 g/dL (ref 3.4–5.0)
Alkaline Phosphatase: 103 U/L
Anion Gap: 7 (ref 7–16)
BILIRUBIN TOTAL: 0.4 mg/dL (ref 0.2–1.0)
BUN: 9 mg/dL (ref 7–18)
Calcium, Total: 9.3 mg/dL (ref 8.5–10.1)
Chloride: 104 mmol/L (ref 98–107)
Co2: 29 mmol/L (ref 21–32)
Creatinine: 0.93 mg/dL (ref 0.60–1.30)
Glucose: 84 mg/dL (ref 65–99)
Osmolality: 277 (ref 275–301)
POTASSIUM: 4.1 mmol/L (ref 3.5–5.1)
SGOT(AST): 22 U/L (ref 15–37)
SGPT (ALT): 24 U/L
Sodium: 140 mmol/L (ref 136–145)
Total Protein: 7.4 g/dL (ref 6.4–8.2)

## 2014-01-27 ENCOUNTER — Emergency Department: Payer: Self-pay | Admitting: Emergency Medicine

## 2014-01-27 LAB — COMPREHENSIVE METABOLIC PANEL
ALT: 23 U/L
ANION GAP: 7 (ref 7–16)
AST: 25 U/L (ref 15–37)
Albumin: 3.4 g/dL (ref 3.4–5.0)
Alkaline Phosphatase: 102 U/L
BILIRUBIN TOTAL: 0.3 mg/dL (ref 0.2–1.0)
BUN: 10 mg/dL (ref 7–18)
CREATININE: 0.94 mg/dL (ref 0.60–1.30)
Calcium, Total: 8.9 mg/dL (ref 8.5–10.1)
Chloride: 108 mmol/L — ABNORMAL HIGH (ref 98–107)
Co2: 27 mmol/L (ref 21–32)
GLUCOSE: 91 mg/dL (ref 65–99)
OSMOLALITY: 282 (ref 275–301)
POTASSIUM: 3.8 mmol/L (ref 3.5–5.1)
Sodium: 142 mmol/L (ref 136–145)
Total Protein: 7.2 g/dL (ref 6.4–8.2)

## 2014-01-27 LAB — CBC
HCT: 28.6 % — ABNORMAL LOW (ref 40.0–52.0)
HGB: 9.7 g/dL — AB (ref 13.0–18.0)
MCH: 30.5 pg (ref 26.0–34.0)
MCHC: 33.8 g/dL (ref 32.0–36.0)
MCV: 90 fL (ref 80–100)
Platelet: 127 10*3/uL — ABNORMAL LOW (ref 150–440)
RBC: 3.18 10*6/uL — AB (ref 4.40–5.90)
RDW: 22.4 % — ABNORMAL HIGH (ref 11.5–14.5)
WBC: 3.4 10*3/uL — ABNORMAL LOW (ref 3.8–10.6)

## 2014-01-27 LAB — PROTIME-INR
INR: 1
Prothrombin Time: 12.8 secs (ref 11.5–14.7)

## 2014-01-27 LAB — LIPASE, BLOOD: Lipase: 68 U/L — ABNORMAL LOW (ref 73–393)

## 2014-01-30 LAB — CBC CANCER CENTER
Basophil #: 0 x10 3/mm (ref 0.0–0.1)
Basophil %: 0.2 %
Eosinophil #: 0.2 x10 3/mm (ref 0.0–0.7)
Eosinophil %: 4.3 %
HCT: 29 % — ABNORMAL LOW (ref 40.0–52.0)
HGB: 9.7 g/dL — AB (ref 13.0–18.0)
LYMPHS PCT: 37.1 %
Lymphocyte #: 1.4 x10 3/mm (ref 1.0–3.6)
MCH: 30.1 pg (ref 26.0–34.0)
MCHC: 33.5 g/dL (ref 32.0–36.0)
MCV: 90 fL (ref 80–100)
MONO ABS: 0.6 x10 3/mm (ref 0.2–1.0)
Monocyte %: 15.9 %
Neutrophil #: 1.6 x10 3/mm (ref 1.4–6.5)
Neutrophil %: 42.5 %
PLATELETS: 170 x10 3/mm (ref 150–440)
RBC: 3.22 10*6/uL — AB (ref 4.40–5.90)
RDW: 22.5 % — ABNORMAL HIGH (ref 11.5–14.5)
WBC: 3.7 x10 3/mm — AB (ref 3.8–10.6)

## 2014-01-30 LAB — COMPREHENSIVE METABOLIC PANEL
ALBUMIN: 3.6 g/dL (ref 3.4–5.0)
ALK PHOS: 101 U/L
ANION GAP: 6 — AB (ref 7–16)
AST: 23 U/L (ref 15–37)
BILIRUBIN TOTAL: 0.4 mg/dL (ref 0.2–1.0)
BUN: 5 mg/dL — AB (ref 7–18)
CREATININE: 0.87 mg/dL (ref 0.60–1.30)
Calcium, Total: 9.2 mg/dL (ref 8.5–10.1)
Chloride: 105 mmol/L (ref 98–107)
Co2: 30 mmol/L (ref 21–32)
EGFR (Non-African Amer.): >60
GLUCOSE: 86 mg/dL (ref 65–99)
OSMOLALITY: 278 (ref 275–301)
Potassium: 4 mmol/L (ref 3.5–5.1)
SGPT (ALT): 23 U/L
SODIUM: 141 mmol/L (ref 136–145)
Total Protein: 7.2 g/dL (ref 6.4–8.2)

## 2014-02-04 LAB — CBC CANCER CENTER
BASOS PCT: 0.3 %
Basophil #: 0 x10 3/mm (ref 0.0–0.1)
EOS ABS: 0.1 x10 3/mm (ref 0.0–0.7)
Eosinophil %: 2.5 %
HCT: 29.5 % — ABNORMAL LOW (ref 40.0–52.0)
HGB: 9.8 g/dL — ABNORMAL LOW (ref 13.0–18.0)
Lymphocyte #: 1 x10 3/mm (ref 1.0–3.6)
Lymphocyte %: 27 %
MCH: 30.3 pg (ref 26.0–34.0)
MCHC: 33.3 g/dL (ref 32.0–36.0)
MCV: 91 fL (ref 80–100)
MONOS PCT: 14.3 %
Monocyte #: 0.6 x10 3/mm (ref 0.2–1.0)
Neutrophil #: 2.2 x10 3/mm (ref 1.4–6.5)
Neutrophil %: 55.9 %
Platelet: 224 x10 3/mm (ref 150–440)
RBC: 3.24 10*6/uL — AB (ref 4.40–5.90)
RDW: 23 % — ABNORMAL HIGH (ref 11.5–14.5)
WBC: 3.9 x10 3/mm (ref 3.8–10.6)

## 2014-02-11 LAB — CBC CANCER CENTER
Basophil #: 0 x10 3/mm (ref 0.0–0.1)
Basophil %: 0.3 %
Eosinophil #: 0.1 x10 3/mm (ref 0.0–0.7)
Eosinophil %: 1.7 %
HCT: 28.7 % — AB (ref 40.0–52.0)
HGB: 9.6 g/dL — ABNORMAL LOW (ref 13.0–18.0)
LYMPHS PCT: 33.1 %
Lymphocyte #: 1.3 x10 3/mm (ref 1.0–3.6)
MCH: 30.6 pg (ref 26.0–34.0)
MCHC: 33.5 g/dL (ref 32.0–36.0)
MCV: 92 fL (ref 80–100)
Monocyte #: 0.5 x10 3/mm (ref 0.2–1.0)
Monocyte %: 13.6 %
NEUTROS ABS: 2.1 x10 3/mm (ref 1.4–6.5)
Neutrophil %: 51.3 %
PLATELETS: 236 x10 3/mm (ref 150–440)
RBC: 3.14 10*6/uL — AB (ref 4.40–5.90)
RDW: 22 % — AB (ref 11.5–14.5)
WBC: 4 x10 3/mm (ref 3.8–10.6)

## 2014-02-15 ENCOUNTER — Ambulatory Visit: Payer: Self-pay | Admitting: Oncology

## 2014-03-04 LAB — CBC CANCER CENTER
BASOS ABS: 0 x10 3/mm (ref 0.0–0.1)
Basophil %: 0.4 %
EOS ABS: 0.3 x10 3/mm (ref 0.0–0.7)
Eosinophil %: 7.8 %
HCT: 29.6 % — ABNORMAL LOW (ref 40.0–52.0)
HGB: 9.9 g/dL — ABNORMAL LOW (ref 13.0–18.0)
LYMPHS PCT: 36 %
Lymphocyte #: 1.5 x10 3/mm (ref 1.0–3.6)
MCH: 31.2 pg (ref 26.0–34.0)
MCHC: 33.5 g/dL (ref 32.0–36.0)
MCV: 93 fL (ref 80–100)
Monocyte #: 0.5 x10 3/mm (ref 0.2–1.0)
Monocyte %: 11.6 %
NEUTROS ABS: 1.8 x10 3/mm (ref 1.4–6.5)
Neutrophil %: 44.2 %
PLATELETS: 70 x10 3/mm — AB (ref 150–440)
RBC: 3.17 10*6/uL — AB (ref 4.40–5.90)
RDW: 20.2 % — ABNORMAL HIGH (ref 11.5–14.5)
WBC: 4.1 x10 3/mm (ref 3.8–10.6)

## 2014-03-04 LAB — COMPREHENSIVE METABOLIC PANEL
ALBUMIN: 3.5 g/dL (ref 3.4–5.0)
ANION GAP: 8 (ref 7–16)
Alkaline Phosphatase: 114 U/L
BUN: 8 mg/dL (ref 7–18)
Bilirubin,Total: 0.3 mg/dL (ref 0.2–1.0)
CHLORIDE: 106 mmol/L (ref 98–107)
CREATININE: 0.94 mg/dL (ref 0.60–1.30)
Calcium, Total: 8.9 mg/dL (ref 8.5–10.1)
Co2: 27 mmol/L (ref 21–32)
EGFR (African American): >60
EGFR (Non-African Amer.): >60
Glucose: 82 mg/dL (ref 65–99)
Osmolality: 279 (ref 275–301)
POTASSIUM: 4.2 mmol/L (ref 3.5–5.1)
SGOT(AST): 22 U/L (ref 15–37)
SGPT (ALT): 21 U/L
Sodium: 141 mmol/L (ref 136–145)
TOTAL PROTEIN: 7.3 g/dL (ref 6.4–8.2)

## 2014-03-04 LAB — MAGNESIUM: Magnesium: 1.8 mg/dL

## 2014-03-18 ENCOUNTER — Ambulatory Visit: Payer: Self-pay | Admitting: Oncology

## 2014-03-18 LAB — BASIC METABOLIC PANEL
ANION GAP: 9 (ref 7–16)
BUN: 6 mg/dL — ABNORMAL LOW (ref 7–18)
CALCIUM: 9 mg/dL (ref 8.5–10.1)
Chloride: 105 mmol/L (ref 98–107)
Co2: 28 mmol/L (ref 21–32)
Creatinine: 0.92 mg/dL (ref 0.60–1.30)
GLUCOSE: 82 mg/dL (ref 65–99)
Osmolality: 280 (ref 275–301)
Potassium: 3.9 mmol/L (ref 3.5–5.1)
Sodium: 142 mmol/L (ref 136–145)

## 2014-03-18 LAB — CBC CANCER CENTER
BASOS ABS: 0 x10 3/mm (ref 0.0–0.1)
BASOS PCT: 0.3 %
EOS ABS: 0.2 x10 3/mm (ref 0.0–0.7)
Eosinophil %: 4.7 %
HCT: 28.6 % — ABNORMAL LOW (ref 40.0–52.0)
HGB: 9.6 g/dL — AB (ref 13.0–18.0)
LYMPHS ABS: 1 x10 3/mm (ref 1.0–3.6)
LYMPHS PCT: 23 %
MCH: 31.9 pg (ref 26.0–34.0)
MCHC: 33.6 g/dL (ref 32.0–36.0)
MCV: 95 fL (ref 80–100)
Monocyte #: 0.7 x10 3/mm (ref 0.2–1.0)
Monocyte %: 17.9 %
NEUTROS ABS: 2.2 x10 3/mm (ref 1.4–6.5)
NEUTROS PCT: 54.1 %
Platelet: 184 x10 3/mm (ref 150–440)
RBC: 3.01 10*6/uL — ABNORMAL LOW (ref 4.40–5.90)
RDW: 18.2 % — ABNORMAL HIGH (ref 11.5–14.5)
WBC: 4.2 x10 3/mm (ref 3.8–10.6)

## 2014-03-22 ENCOUNTER — Ambulatory Visit: Payer: Self-pay | Admitting: Gastroenterology

## 2014-04-16 ENCOUNTER — Ambulatory Visit
Admit: 2014-04-16 | Disposition: A | Discharge status: Home / Self Care | Payer: Self-pay | Attending: Oncology | Admitting: Oncology

## 2014-04-24 ENCOUNTER — Encounter: Payer: Self-pay | Admitting: General Surgery

## 2014-04-24 ENCOUNTER — Ambulatory Visit (INDEPENDENT_AMBULATORY_CARE_PROVIDER_SITE_OTHER): Payer: BC Managed Care – PPO | Admitting: General Surgery

## 2014-04-24 VITALS — BP 141/70 | HR 62 | Resp 14 | Ht 74.0 in | Wt 153.0 lb

## 2014-04-24 DIAGNOSIS — C187 Malignant neoplasm of sigmoid colon: Secondary | ICD-10-CM | POA: Diagnosis not present

## 2014-04-24 DIAGNOSIS — C3491 Malignant neoplasm of unspecified part of right bronchus or lung: Secondary | ICD-10-CM

## 2014-04-24 MED ORDER — POLYETHYLENE GLYCOL 3350 17 GM/SCOOP PO POWD: ORAL | Status: DC

## 2014-04-24 MED ORDER — METRONIDAZOLE 500 MG PO TABS: ORAL_TABLET | ORAL | Status: DC

## 2014-04-24 MED ORDER — NEOMYCIN SULFATE 500 MG PO TABS: ORAL_TABLET | ORAL | Status: DC
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
## 2014-04-24 NOTE — Patient Instructions (Addendum)
Laparoscopic Colectomy Laparoscopic colectomy is surgery to remove part or all of the large intestine (colon). This procedure is used to treat several conditions, including:  Inflammation and infection of the colon (diverticulitis).  Tumors or masses in the colon.  Inflammatory bowel disease, such as Crohn disease or ulcerative colitis. Colectomy is an option when symptoms cannot be controlled with medicines.  Bleeding from the colon that cannot be controlled by another method.  Blockage or obstruction of the colon. LET [facility] CARE PROVIDER KNOW ABOUT:  Any allergies you have.  All medicines you are taking, including vitamins, herbs, eye drops, creams, and over-the-counter medicines.  Previous problems you or members of your family have had with the use of anesthetics.  Any blood disorders you have.  Previous surgeries you have had.  Medical conditions you have. RISKS AND COMPLICATIONS Generally, this is a safe procedure. However, as with any procedure, complications can occur. Possible complications include:  Infection.  Bleeding.  Damage to other organs.  Leaking from where the colon was sewn together.  Future blockage of the small intestines from scar tissue. Another surgery may be needed to repair this. In some cases, complications such as damage to other organs or excessive bleeding may require the surgeon to convert from a laparoscopic procedure to an open procedure. This involves making a larger incision in the abdomen to perform the procedure. BEFORE THE PROCEDURE  Ask your health care provider about changing or stopping any regular medicines.  You may be prescribed an oral bowel prep. This involves drinking a large amount of medicated liquid, starting the day before your surgery. The liquid will cause you to have multiple loose stools until your stool is almost clear or light green. This cleans out your colon in preparation for the surgery.  Do not eat or  drink anything else once you have started the bowel prep, unless your health care provider tells you it is safe to do so.  You may also be given antibiotic pills to clean out your colon of bacteria. Be sure to follow the directions carefully and take the medicine at the correct time. PROCEDURE   Small monitors will be put on your body. They are used to check your heart, blood pressure, and oxygen level.  An IV access tube will be put into one of your veins. Medicine will be able to flow directly into your body through this IV tube.  You might be given a medicine to help you relax (sedative).  You will be given a medicine to make you sleep through the procedure (general anesthetic). A breathing tube may be placed into your lungs during the procedure.  A thin, flexible tube (catheter) will be placed into your bladder to collect urine.  A tube may be put in through your nose. It is called a nasogastric tube. It is used to remove stomach fluids after surgery until the intestines start working again.  Your abdomen will be filled with air so that it expands. This gives the surgeon more room to operate and makes your organs easier to see.  Several small cuts (incisions) are made in your abdomen.  A thin, lighted tube with a tiny camera on the end (laparoscope) is put through one of the small incisions. The camera on the laparoscope sends a picture to a TV screen in the operating room. This gives the surgeon a good view inside your abdomen.  Hollow tubes are put through the other small incisions in your abdomen. The tools  needed for the procedure are put through these tubes.  Clamps or staples are put on both ends of the diseased part of the colon.  The part of the intestine between the clamps or staples is removed.  If possible, the ends of the healthy colon that remain will be stitched or stapled together to allow your body to expel waste (stool).  Sometimes, the remaining colon cannot be  stitched back together. If this is the case, a colostomy is needed. For a colostomy:  An opening (stoma) to the outside of your body is made through the abdomen.  The end of the colon is brought to the opening. It is stitched to the skin.  A bag is attached to the opening. Stool will drain into this bag. The bag is removable.  The colostomy can be temporary or permanent.  The incisions from the colectomy are closed with stitches or staples. AFTER THE PROCEDURE  You will be monitored closely in a recovery area until you are stable and doing well. You will then be moved to a regular hospital room.  You will need to receive fluids through an IV tube until your bowel function has returned. This may take 1-3 days. Once your bowels are working again, you will be started on clear liquids and then advanced to solid food as tolerated.  You will be given pain medicines to control your pain. Document Released: 04/24/2002 Document Revised: 11/22/2012 Document Reviewed: 09/13/2012 [facility] Patient Information 2015 [place], [place]. This information is not intended to replace advice given to you by your health care provider. Make sure you discuss any questions you have with your health care provider.  Patient's surgery has been scheduled for 05-03-14 at [facility].

## 2014-04-26 ENCOUNTER — Ambulatory Visit: Payer: Self-pay | Admitting: Oncology

## 2014-04-29 ENCOUNTER — Other Ambulatory Visit: Payer: Self-pay | Admitting: General Surgery

## 2014-04-29 ENCOUNTER — Telehealth: Payer: Self-pay | Admitting: *Deleted

## 2014-04-29 DIAGNOSIS — C187 Malignant neoplasm of sigmoid colon: Secondary | ICD-10-CM

## 2014-04-29 NOTE — Telephone Encounter (Signed)
Per Fraser Din in [employer], patient was a no show for pre-admission appointment this morning.   Patient was contacted this afternoon. He was given the number to scheduling and he will call and see if they can see him tomorrow. Patient seemed to be confused as he did have lab work and saw the [employer] this morning.   This patient was again reminded about bowel prep that he has to complete on Thursday to be cleaned out for surgery on Friday, 05-03-14.

## 2014-04-30 ENCOUNTER — Ambulatory Visit: Payer: Self-pay | Admitting: General Surgery

## 2014-05-03 ENCOUNTER — Inpatient Hospital Stay: Payer: BC Managed Care – PPO | Admitting: General Surgery

## 2014-05-03 DIAGNOSIS — C19 Malignant neoplasm of rectosigmoid junction: Secondary | ICD-10-CM

## 2014-05-03 HISTORY — PX: COLON SURGERY: SHX602

## 2014-05-06 ENCOUNTER — Encounter: Payer: Self-pay | Admitting: General Surgery

## 2014-05-07 ENCOUNTER — Encounter: Payer: Self-pay | Admitting: General Surgery

## 2014-05-16 ENCOUNTER — Ambulatory Visit (INDEPENDENT_AMBULATORY_CARE_PROVIDER_SITE_OTHER): Payer: BC Managed Care – PPO | Admitting: General Surgery

## 2014-05-16 ENCOUNTER — Encounter: Payer: Self-pay | Admitting: General Surgery

## 2014-05-16 VITALS — BP 118/82 | HR 82 | Resp 18 | Ht 74.0 in | Wt 149.0 lb

## 2014-05-16 DIAGNOSIS — C187 Malignant neoplasm of sigmoid colon: Secondary | ICD-10-CM

## 2014-05-16 LAB — COMPREHENSIVE METABOLIC PANEL
ALK PHOS: 221 U/L — AB
ALT: 32 U/L
Albumin: 4 g/dL
Anion Gap: 7 (ref 7–16)
BILIRUBIN TOTAL: 0.6 mg/dL
BUN: 12 mg/dL
CALCIUM: 9.3 mg/dL
CREATININE: 0.8 mg/dL
Chloride: 102 mmol/L
Co2: 26 mmol/L
EGFR (African American): >60
EGFR (Non-African Amer.): >60
Glucose: 89 mg/dL
Potassium: 4.1 mmol/L
SGOT(AST): 40 U/L
Sodium: 135 mmol/L
Total Protein: 7.8 g/dL

## 2014-05-16 LAB — CBC CANCER CENTER
BASOS ABS: 0 x10 3/mm (ref 0.0–0.1)
Basophil %: 0.5 %
Eosinophil #: 0.3 x10 3/mm (ref 0.0–0.7)
Eosinophil %: 3.2 %
HCT: 30.1 % — ABNORMAL LOW (ref 40.0–52.0)
HGB: 10 g/dL — ABNORMAL LOW (ref 13.0–18.0)
Lymphocyte #: 1.3 x10 3/mm (ref 1.0–3.6)
Lymphocyte %: 15.7 %
MCH: 31 pg (ref 26.0–34.0)
MCHC: 33.4 g/dL (ref 32.0–36.0)
MCV: 93 fL (ref 80–100)
MONO ABS: 1 x10 3/mm (ref 0.2–1.0)
MONOS PCT: 12.7 %
Neutrophil #: 5.5 x10 3/mm (ref 1.4–6.5)
Neutrophil %: 67.9 %
PLATELETS: 364 x10 3/mm (ref 150–440)
RBC: 3.24 10*6/uL — ABNORMAL LOW (ref 4.40–5.90)
RDW: 15 % — AB (ref 11.5–14.5)
WBC: 8.1 x10 3/mm (ref 3.8–10.6)

## 2014-05-16 NOTE — Patient Instructions (Signed)
Patient to return in one month. 

## 2014-05-16 NOTE — Progress Notes (Signed)
He is here to for postoperative visit, Resection of Sigmoid Colon on 05-03-14.Liver biopsy also done  He states he is doing well. Bowels moving every other day and appetite is good. Minimal pain. Incisions and port sites are healing well and clean. Abdomen is soft and lungs are clear.  Path showed adenocarcinoma in  rectosigmoid, liver met is from colon. Chemo is planned in view of this new finding.  Patient to return in one month. Avoid exertional activity.

## 2014-05-17 ENCOUNTER — Ambulatory Visit
Admit: 2014-05-17 | Disposition: A | Discharge status: Home / Self Care | Payer: Self-pay | Attending: Oncology | Admitting: Oncology

## 2014-05-20 LAB — COMPREHENSIVE METABOLIC PANEL
ANION GAP: 7 (ref 7–16)
Albumin: 3.9 g/dL
Alkaline Phosphatase: 229 U/L — ABNORMAL HIGH
BILIRUBIN TOTAL: 0.8 mg/dL
BUN: 10 mg/dL
CO2: 26 mmol/L
Calcium, Total: 9.2 mg/dL
Chloride: 100 mmol/L — ABNORMAL LOW
Creatinine: 0.86 mg/dL
EGFR (Non-African Amer.): >60
GLUCOSE: 142 mg/dL — AB
Potassium: 3.7 mmol/L
SGOT(AST): 64 U/L — ABNORMAL HIGH
SGPT (ALT): 34 U/L
SODIUM: 133 mmol/L — AB
Total Protein: 7.8 g/dL

## 2014-05-20 LAB — CBC CANCER CENTER
Basophil #: 0.1 x10 3/mm (ref 0.0–0.1)
Basophil %: 0.5 %
EOS PCT: 0.8 %
Eosinophil #: 0.1 x10 3/mm (ref 0.0–0.7)
HCT: 31.2 % — ABNORMAL LOW (ref 40.0–52.0)
HGB: 10.5 g/dL — ABNORMAL LOW (ref 13.0–18.0)
LYMPHS PCT: 11.8 %
Lymphocyte #: 1.4 x10 3/mm (ref 1.0–3.6)
MCH: 31 pg (ref 26.0–34.0)
MCHC: 33.7 g/dL (ref 32.0–36.0)
MCV: 92 fL (ref 80–100)
Monocyte #: 1.4 x10 3/mm — ABNORMAL HIGH (ref 0.2–1.0)
Monocyte %: 11.5 %
NEUTROS PCT: 75.4 %
Neutrophil #: 9.2 x10 3/mm — ABNORMAL HIGH (ref 1.4–6.5)
Platelet: 339 x10 3/mm (ref 150–440)
RBC: 3.38 10*6/uL — AB (ref 4.40–5.90)
RDW: 15.1 % — ABNORMAL HIGH (ref 11.5–14.5)
WBC: 12.2 x10 3/mm — ABNORMAL HIGH (ref 3.8–10.6)

## 2014-05-21 LAB — CEA: CEA: 775.3 ng/mL — AB (ref 0.0–4.7)

## 2014-05-24 LAB — CEA: CEA: 603.3 ng/mL — AB (ref 0.0–4.7)

## 2014-06-07 NOTE — Op Note (Signed)
PATIENT NAME:  [name], [name] MR#:  [id number] DATE OF BIRTH:  24-Nov-1940  DATE OF PROCEDURE:  11/07/2012  PREOPERATIVE DIAGNOSIS: Lung carcinoma.   POSTOPERATIVE DIAGNOSIS: Lung carcinoma.  PROCEDURE PERFORMED: Insertion of left internal jugular Infuse-a-Port with ultrasound and fluoroscopic guidance.   SURGEON: Hortencia Pilar, M.D.   SEDATION: Versed 4 mg plus fentanyl 200 mcg administered IV. Continuous ECG, pulse oximetry and cardiopulmonary monitoring is performed throughout the entire procedure by the interventional radiology nurse. Total sedation time was 40 minutes.   ACCESS: Left IJ.   FLUOROSCOPY TIME: Less than 1 minute.   CONTRAST USED: None.   INDICATIONS: [name] is a 74 year old gentleman, who has been found to have right-sided lung carcinoma. He is undergoing chemotherapy and will therefore require adequate IV access. Risks and benefits were reviewed. All questions answered. The patient has agreed to proceed.   DESCRIPTION OF PROCEDURE: The patient is taken to special procedures and placed in the supine position. After adequate sedation is achieved, the left neck and chest wall are prepped and draped in sterile fashion. Lidocaine 1% is infiltrated into the soft tissues at the base of the neck as well as on the chest wall. Ultrasound is placed in a sterile sleeve. Jugular vein is identified. It is echolucent and compressible indicating patency. Image is recorded for the permanent record. Under direct ultrasound visualization, Seldinger needle is used to access the jugular vein. J-wire is then advanced under fluoroscopic guidance into the inferior vena cava.   A counter incision is made at the wire insertion site with an 11 blade scalpel. A small pocket is fashioned with blunt dissection.   The linear incision is then created 2 fingerbreadths below the clavicle and a pocket fashioned using both blunt and sharp dissection. After testing for appropriate size, the catheter is then  attached to the tunneling device and the catheter is pulled subcutaneously from the pocket to the neck counter incision. Peel-away sheath and dilator are then advanced over the wire under fluoroscopic guidance. The wire and dilator are removed and the catheter is advanced through the peel-away sheath. The peel-away sheath is removed and the catheter is adjusted so that the tip is at the atriocaval junction under fluoroscopic guidance. The catheter is transected and connected to the hub and the hub is slipped into the subcutaneous pocket. The hub is accessed with a Huber needle. It aspirates easily and flushes well. Fluoroscopy is used to demonstrate the catheter has a smooth contour, free of kinks and the tip is in the appropriate location. The subcutaneous tissues are then reapproximated with an interrupted 3-0 Vicryl and the skin, both at the neck counter incision as well as at the port incision, is closed with 4-0 Monocryl subcuticular. Dermabond is applied. The hub is then accessed with an angled Huber needle as he will be receiving a treatment today. It aspirates and flushes easily again.   ____________________________ Katha Cabal, MD ggs:aw D: 11/07/2012 10:44:39 ET T: 11/07/2012 11:08:14 ET JOB#: [id number]  cc: Katha Cabal, MD, <Dictator> Martie Lee. Oliva Bustard, MD Mikeal Hawthorne Brynda Greathouse, MD Katha Cabal MD ELECTRONICALLY SIGNED 11/17/2012 10:02

## 2014-06-07 NOTE — Consult Note (Signed)
Reason for Visit: This 74 year old Male patient presents to the clinic for initial evaluation of  lung cancer .   Referred by Dr. Oliva Bustard.  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 He is also had a slight weight loss over the past several months. Initial chest x-ray showed a large right upper lobe mass confirmed on PET/CT to be hypermetabolic. No other distant disease was noted. His staging workupis being completed today with an MRI scan of his brain. Patient underwent bronchoscopy which was positive for small cell undifferentiated lung cancer. He is having no hemoptysis recently. Still has a nonproductive cough. I been asked to evaluate the patient for possible radiation therapy concurrently with chemotherapy.  Past Hx:    lung CA:    prostate surgery:   Past, Family and Social History:  Past Medical History positive   Genitourinary history of prostate cancer status postHIFUreatment   Family History positive   Family History Comments family history of lung cancer   Social History positive   Social History Comments greater than 50-pack-year smoking history quit smoking 3 years prior. Works as an Marketing executive Past Medical and Surgical History seen accompanied by nurse navigator today   Allergies:   No Known Allergies:   Review of Systems:  General negative   Performance Status (ECOG) 0   Skin negative   Breast negative   Ophthalmologic negative   ENMT negative   Respiratory and Thorax  see HPI   Cardiovascular negative   Gastrointestinal negative   Genitourinary see HPI   Musculoskeletal negative   Neurological negative   Psychiatric negative   Hematology/Lymphatics negative   Endocrine negative   Allergic/Immunologic negative   Nursing Notes:  Nursing Vital Signs and Chemo Nursing Nursing Notes: *CC Vital Signs Flowsheet:   18-Sep-14 08:39  Temp Temperature 96.3  Pulse Pulse 94  Respirations Respirations 20  SBP SBP 162  DBP DBP 97  Pain Scale (0-10)  0  Current Weight (kg) (kg) 70.8  Height (cm) centimeters 185  BSA (m2) 1.9   Physical Exam:  General/Skin/HEENT:  General normal   Skin normal   Eyes normal   ENMT normal   Head and Neck normal   Additional PE well-developed thin male in NAD. No cervical or supraclavicular adenopathy is identified. Neurologic examination is intact. Lungs are clear to A&P cardiac examination shows regular rate and rhythm. Abdomen is benign with no organomegaly or masses noted.   Breasts/Resp/CV/GI/GU:  Respiratory and Thorax normal   Cardiovascular normal   Gastrointestinal normal   Genitourinary normal   MS/Neuro/Psych/Lymph:  Musculoskeletal normal   Neurological normal   Lymphatics normal   Other Results:  Radiology Results: LabUnknown:    21-Aug-14 15:02, CT Chest With Contrast  PACS Image     15-Sep-14 14:00, PET/CT Scan Lung Cancer Initial Staging  PACS Image   CT:    21-Aug-14 15:02, CT Chest With Contrast  CT Chest With Contrast   REASON FOR EXAM:    LABS 1ST Neoplasm of lung  COMMENTS:  PROCEDURE: KCT - KCT CHEST WITH CONTRAST  - Oct 05 2012  3:02PM     RESULT: Chest CT is performed with 75 mL of Isovue-370 iodinated   intravenous contrast with images reconstructed at 3.0 mm slice thickness   in the axial plane. The patient has no previous CT for comparison.    There is a large area consolidation involving the right upper lobe. There   is obstruction of the right  upper lobe bronchus. The mass extends to the   right hilum. It is difficult to discern the mass from atelectatic lung.   Followup PET CT is recommended. The soft tissue density abuts the   superior mediastinum and trachea as well is the spine. There is   heterogeneous opacity in this region. The overall size of the     consolidated area measures is much as 12.2 cm superior to inferior, 11.3   cm transversely and 11 cm anterior to posterior. The remaining portions   of the lungs demonstrate apical fibrosis bilaterally. There is some   nodularity in the left upper lobe abutting the major fissure on image 47   measuring approximately 9 mm without evidence of calcification. No other   masses are seen. 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 Oncologic   and pulmonary consultation is recommended. There is a cyst in the upper   pole the left kidney. There is a cyst anteriorly in the mid left kidney.   The left adrenal gland appears unremarkable. The right adrenal gland is   not definitely identified. There appear to be cystic changes in the   periphery of the liver measuring approximately 6 mm on image 98. The left   lobe hepatic cyst appears present on image 97 measuring approximately 8.5   mm.    IMPRESSION:   1. Probable right hilar mass with mass in atelectatic lung be difficult   to differentiate in the right upper lobe. Pulmonology and oncology   consultation is recommended. PET CT and on constant be a recommended.   Smoothly marginated nodular density in the left upper lobe posteriorly   abutting the major fissure which is nonspecific. Cysts are seen in the   left kidney and the liver.    Dictation Site: 2        Verified By: Sundra Aland, M.D., MD  Nuclear Med:    15-Sep-14 14:00, PET/CT Scan Lung Cancer Initial Staging  PET/CT Scan Lung Cancer Initial Staging   REASON FOR EXAM:    Lung Ca  COMMENTS:       PROCEDURE: PET - PET/CT INIT STAGING LUNG CA  - Oct 30 2012   2:00PM     RESULT: The patient is undergoing initial staging of lung malignancy. The   patient's fasting blood glucose level was 79 mg/dL. The patient received   12.2 mCi of F-18 labeled FDG at 12:27 p.m. with scanning beginning at   13:27 p.m. A noncontrast CT scan was performed at the same sitting for   coregistration and attenuation correction.    Within the neck no abnormal uptake of the radiopharmaceutical is   demonstrated.    There is a large amount of uptake predominantly in the periphery of the     abnormal mass occupying much of the posterior aspect of the right upper   hemithorax. This exhibits maximal SUV of 14 with a mean of 7.6. No other   abnormal uptake is demonstrated within the thorax. Given the appearance   it  is likely that there is central necrosis.     Within the abdomen and pelvis normal expected activity is demonstrated in   the urinary tracts. There is no abnormal uptake within the liver or   adrenal glands or bowel. There is no abnormal skeletal uptake   demonstrated.    On the CT images a large heterogeneous density mass is present in the   posterior aspect of the the left upper and mid hemithorax. The mass   measures 12.2 cm AP x 10.4 cm transversely x 12.5 cm in superior to   inferior dimension. It is contiguous with the mediastinum and right hilar   structures. No left hilar lymphadenopathy is demonstrated. There is no     pleural nor pericardial effusion. An area of parenchymal consolidation   more inferiorly in the right middle lobe is present, and it does not   exhibit abnormal increased uptake.    Within the abdomen large cystic structures are present within both   kidneys. There are no adrenal masses. The liver exhibits no abnormal   masses. There is no evidence of ascites.    IMPRESSION:    1. There is a large hypermetabolic mass likely with central necrosis   corresponding to the CT findings occupying much of the right upper   hemithorax.  The finding is worrisome for malignancy. This includes both   parenchymal and mediastinal and hilar abnormalities.  2. There is an additional area of parenchymal consolidation more   inferiorly in the right middle lobe which is not hypermetabolic.  3. There are multiple cystic appearing structures associated with the   kidneys. No adrenal masses are demonstrated.     Dictation Site: 2        Verified By: DAVID A. Martinique, M.D., MD   Relevent Results:   Relevant Scans and Labs CT scans and PET CT scans were reviewed. MRI of the brain will be reviewed when available.   Assessment and Plan: Impression:   limited stage small cell lung cancer in 74 year old male. Plan:   at this time I would recommend concurrent chemotherapyand radiation therapy. Would treat in a split course matter based on the large size of his lung cancer and treat as the tumor response of the first course of treatment. Would treat up to 4000 cGy over 4 weeks and then wait another 2 weeks for tumor response and if patient tolerance is good and we see excellent treatment response as I would expect would treat with another 2000 cGy in 2 weeks. After completion of all chemotherapy if he has no evidence of disease by radiologic studies we'll proceed with whole brain radiation therapy to eradicate microscopic residual disease in the brain. Risks and benefits of chest radiation including possible dysphasia, increase in productive cough, alteration of blood counts, and destruction normal lung volume were explained in detail to the patient. He seems to comprehend her treatment plan well. We'll have a port placed next week as well as his as his MRI scan of the brain today. I have set him up for CT simulation early next week. We'll coordinate his care with Dr. Oliva Bustard team.  I would like to take this opportunity to thank you for allowing me to continue to participate in this patient's care.    CC Referral:  cc: Dr. Nicky Pugh    Electronic Signatures: Baruch Gouty, Roda Shutters (MD)  (Signed 18-Sep-14 12:05)  Authored: HPI, Diagnosis, Past Hx, PFSH, Allergies, ROS, Nursing Notes, Physical Exam, Other Results, Relevent  Results, Encounter Assessment and Plan, CC Referring Physician   Last Updated: 18-Sep-14 12:05 by Armstead Peaks (MD)

## 2014-06-10 LAB — COMPREHENSIVE METABOLIC PANEL
AST: 28 U/L
Albumin: 4 g/dL
Alkaline Phosphatase: 134 U/L — ABNORMAL HIGH
Anion Gap: 4 — ABNORMAL LOW (ref 7–16)
BILIRUBIN TOTAL: 0.6 mg/dL
BUN: 8 mg/dL
CHLORIDE: 105 mmol/L
Calcium, Total: 9.3 mg/dL
Co2: 27 mmol/L
Creatinine: 0.79 mg/dL
EGFR (African American): >60
Glucose: 101 mg/dL — ABNORMAL HIGH
Potassium: 3.6 mmol/L
SGPT (ALT): 14 U/L — ABNORMAL LOW
Sodium: 136 mmol/L
TOTAL PROTEIN: 7.5 g/dL

## 2014-06-10 LAB — CBC CANCER CENTER
BASOS PCT: 0.8 %
Basophil #: 0.1 x10 3/mm (ref 0.0–0.1)
EOS PCT: 5.8 %
Eosinophil #: 0.4 x10 3/mm (ref 0.0–0.7)
HCT: 31 % — ABNORMAL LOW (ref 40.0–52.0)
HGB: 10.3 g/dL — AB (ref 13.0–18.0)
Lymphocyte #: 1.1 x10 3/mm (ref 1.0–3.6)
Lymphocyte %: 17.2 %
MCH: 30.1 pg (ref 26.0–34.0)
MCHC: 33.4 g/dL (ref 32.0–36.0)
MCV: 90 fL (ref 80–100)
Monocyte #: 1.2 x10 3/mm — ABNORMAL HIGH (ref 0.2–1.0)
Monocyte %: 19.3 %
Neutrophil #: 3.5 x10 3/mm (ref 1.4–6.5)
Neutrophil %: 56.9 %
Platelet: 215 x10 3/mm (ref 150–440)
RBC: 3.44 10*6/uL — AB (ref 4.40–5.90)
RDW: 15.6 % — ABNORMAL HIGH (ref 11.5–14.5)
WBC: 6.1 x10 3/mm (ref 3.8–10.6)

## 2014-06-10 LAB — MAGNESIUM: Magnesium: 1.9 mg/dL

## 2014-06-11 ENCOUNTER — Ambulatory Visit (INDEPENDENT_AMBULATORY_CARE_PROVIDER_SITE_OTHER): Payer: BC Managed Care – PPO | Admitting: General Surgery

## 2014-06-11 ENCOUNTER — Encounter: Payer: Self-pay | Admitting: General Surgery

## 2014-06-11 VITALS — BP 142/68 | HR 74 | Resp 12 | Ht 74.0 in | Wt 151.0 lb

## 2014-06-11 DIAGNOSIS — C187 Malignant neoplasm of sigmoid colon: Secondary | ICD-10-CM

## 2014-06-11 DIAGNOSIS — C3491 Malignant neoplasm of unspecified part of right bronchus or lung: Secondary | ICD-10-CM

## 2014-06-11 NOTE — Progress Notes (Signed)
He is here to for postoperative visit, Resection of Sigmoid Colon on 05-03-14.Liver biopsy also done  He states he is doing well. Patient has a chemo pump he started it 06/10/14.  No more bleeding from rectum. Abdomen is soft, incision well healed and strong. Active bowel sounds Scattered wheezing both lungs-not new. Recovered well from palliative colon resection. Continue oncology f/u and return here prn

## 2014-06-11 NOTE — Patient Instructions (Signed)
Call for any problems. Continue f/u with oncology

## 2014-06-16 NOTE — Discharge Summary (Signed)
PATIENT NAME:  [name], [name] MR#:  [id number] DATE OF BIRTH:  [birth year]  DATE OF ADMISSION:  05/03/2014 DATE OF DISCHARGE:  05/07/2014  HISTORY OF PRESENT ILLNESS: This is a 74 year old male who a little over a year ago was diagnosed with lung cancer with brain metastasis and underwent radiation and chemotherapy.  In the course of follow up, he was also found to have multiple liver nodules. He had excellent response to treatment in both the long and the brain with no significant neurological deficits.  However, the liver lesions persisted.  Recently, the patient developed significant rectal bleeding and underwent evaluation with a colonoscopy and there was a large ulcerated mass identified in the rectosigmoid region.  Given the fact that he had continued to bleed, it was felt reasonable to consider resection of this area and at the same time biopsy the liver since there was a good possibility of liver the liver metastasis being secondary to the colon carcinoma.  Preoperative CEA was markedly elevated.  The patient was agreeable to the plan and he was prepared for formal resection of the rectosigmoid region.   HOSPITAL COURSE:  On March 18, the patient underwent laparoscopy and resection of rectosigmoid along with liver biopsy. Postoperative course was basically uncomplicated. He had return of bowel activity within 48 hours and was allowed to take clear liquids at that time followed later by advancement to full liquid and soft diet. His postoperative labs remained relatively stable. Final pathology report did reveal that he had multiple nodes involved in the resected specimen and the liver biopsy was consistent with a colon primary. The patient was discharged home in stable condition on 05/07/2014 to be followed as an outpatient.   FINAL DIAGNOSES:  1.  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 2.  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  3.  Anemia secondary to rectal bleeding.    OPERATION PERFORMED: Laparoscopy, resection of rectosigmoid with liver biopsy.   ____________________________ S.Robinette Haines, MD sgs:sp D: 05/22/2014 12:30:27 ET T: 05/22/2014 13:03:51 ET JOB#: [id number]  cc: Synthia Innocent. Jamal Collin, MD, <Dictator> [facility] Robinette Haines MD ELECTRONICALLY SIGNED 05/23/2014 7:46

## 2014-06-16 NOTE — Op Note (Signed)
PATIENT NAMESAMUAL, [name] MR#:  [id number] DATE OF BIRTH:  1941-01-30  DATE OF PROCEDURE:  05/03/2014  PREOPERATIVE DIAGNOSES:   1. Carcinoma of the rectosigmoid colon.  2. Carcinoma of the lung.  3. Liver metastases.   OPERATION PERFORMED: Laparoscopy, liver biopsy, and sigmoid resection with low pelvic reanastomosis.   ANESTHESIA: General.   COMPLICATIONS: None.   ESTIMATED BLOOD LOSS:  About a 100 mL.   DRAINS: None   CLINICAL NOTE: This is a 74 year old male who was initially diagnosed with lung cancer and underwent chemotherapy. In the course of initial evaluation, liver metastases were encountered and it was thought that the liver metastases were from the lung. Although the patient had a good response from chemotherapy to the lung lesion resolving, the hepatic metastases appeared actually to be slightly more prominent. In the course of evaluation, it was noted that the patient had a mass in the low sigmoid area and a colonoscopy had revealed a large ulcerating mass in the rectosigmoid region. He had continued to bleed from this on a daily basis and in view of this, it was recommended that patient have a palliative resection and at the same time, biopsy of the liver will be undertaken to see if the primary is from lung or from the colon. With this in mind the patient was brought to the operating room for the procedure.   DESCRIPTION OF PROCEDURE: The patient was put to sleep in the supine position on the operating table. Foley catheter was inserted. He was then placed on adjustable stirrups. The abdomen and perineum were then prepped and draped out as a sterile field. Timeout was performed. A laparoscopy was planned first and a small incision was made at the umbilicus and the Veress needle with the InnerDyne sleeve was positioned in the peritoneal cavity and verified with the hanging drop method. Pneumoperitoneum was obtained and a 10 mm port was placed. Subsequent to this, an 11 mm port  in the right lower quadrant and a 5 mm port in the suprapubic region were used. The liver was inspected and 2 metastatic nodules were identified, one on the anterolateral surface and one in the anterior surface in the superior part, close to the dome of the diaphragm. The anterolateral site was more easily accessible by use of a Bard needle, which was an 18-gauge. This was passed through a tiny stab incision below the costal margin and directed towards the nodule, and multiple samples were obtained. The pathologist review the tissue with the touch prep showing there was an adequate representation and the tissue amount was adequate for determining the primary source. Attention was then directed to the sigmoid area. The patient had a fairly long sigmoid colon, and it was noted that the mass in question was located right at the distal rectosigmoid junction, just above the peritoneal reflection. The sigmoid colon was mobilized from the lateral aspirate, where we had taken down some adhesions in this area and the ureter was identified and preserved. It was noted at this time that the sigmoid colon was fairly mobile and was easily moved across the right lower abdomen without difficulty. At this point, the laparoscopic procedure was discontinued and the ports were removed. A 5 cm midline incision was made from the suprapubic port site, upward and deepened through the layers of the abdominal wall. Bleeding was controlled with cautery. The sigmoid colon in the distal portion was brought up through the opening, and the proximal site of resection was mapped out  in the mid sigmoid. The mesentery here was freed and the bowel was transected with a GIA stapler. The mesentery was then scored and then taken down with the Harmonic device, all the way down to the area just beyond the presence of the large ulcerating mass, getting a 2 cm margin below this level. This was just above the peritoneal reflection. After this portion of the  rectum was freed off the mesenteric attachment, the auto pursestring device was used to put a pursestring suture around the rectal stump and it was then cut. The resect portion of the colon was then opened. The lesion was identified and it was passed off for pathology. An end to end anastomosis was planned. The area was sized and a 68 EEA was selected. The proximal colotomy was made in the sigmoid colon, vertically, along the tenia and the handle was positioned, taken off the stapler, placed in the rectal pouch, and the pursestring was tied. The stapling device was then passed through the colotomy, and the midline shaft was opened through the staple line and then connected to the rectal area. After the 2 were approximated completely, the instrument was fired and removed with 2 complete rings obtained. The remaining opening was then closed with a TA-60 stapling device. A small soft clamp was placed proximal to the colotomy site, and using a sigmoidoscope in the rectum and fluid around the closure of the colotomy and the anastomotic sites, air was instilled with no evidence of an air leak on pressure testing. The scope was removed and the air evacuated. The fluid was suctioned out and additional fluid was used to irrigate out the area. At this point, instruments were changed, gloves and gowns were changed. The mesenteric opening of the colon was then closed with a running 3-0 Vicryl stitch. The omentum was placed over this area and the abdomen was closed. The peritoneal layer was closed with a running 2-0 Vicryl. The fascia was approximated with interrupted figure-of-eight stitches of 0 Prolene. The wound was irrigated and the skin closed with subcuticular 4-0 Vicryl. The port site incisions at the umbilicus and the right lower quadrant were also closed with 0 Prolene in the fascia and 4-0 subcuticular stitches. LiquiBand was applied over these sites, also at the puncture site for the liver biopsy. The patient  subsequently was extubated and returned to the recovery room in stable condition     ____________________________ S.Robinette Haines, MD sgs:mw D: 05/04/2014 13:43:17 ET T: 05/04/2014 15:31:49 ET JOB#: [id number]  cc: S.G. Jamal Collin, MD, <Dictator> [facility] Robinette Haines MD ELECTRONICALLY SIGNED 05/05/2014 16:25

## 2014-06-21 ENCOUNTER — Other Ambulatory Visit: Payer: Self-pay | Admitting: *Deleted

## 2014-06-21 DIAGNOSIS — C187 Malignant neoplasm of sigmoid colon: Secondary | ICD-10-CM

## 2014-06-23 ENCOUNTER — Other Ambulatory Visit: Payer: Self-pay | Admitting: Oncology

## 2014-06-24 ENCOUNTER — Inpatient Hospital Stay: Payer: BC Managed Care – PPO

## 2014-06-24 ENCOUNTER — Inpatient Hospital Stay (HOSPITAL_BASED_OUTPATIENT_CLINIC_OR_DEPARTMENT_OTHER): Payer: BC Managed Care – PPO | Admitting: Oncology

## 2014-06-24 ENCOUNTER — Inpatient Hospital Stay: Payer: BC Managed Care – PPO | Attending: Oncology

## 2014-06-24 ENCOUNTER — Encounter: Payer: Self-pay | Admitting: Oncology

## 2014-06-24 VITALS — BP 129/88 | HR 94 | Ht 74.0 in | Wt 151.2 lb

## 2014-06-24 DIAGNOSIS — Z85841 Personal history of malignant neoplasm of brain: Secondary | ICD-10-CM | POA: Insufficient documentation

## 2014-06-24 DIAGNOSIS — Z801 Family history of malignant neoplasm of trachea, bronchus and lung: Secondary | ICD-10-CM | POA: Diagnosis not present

## 2014-06-24 DIAGNOSIS — Z5111 Encounter for antineoplastic chemotherapy: Secondary | ICD-10-CM | POA: Insufficient documentation

## 2014-06-24 DIAGNOSIS — Z79899 Other long term (current) drug therapy: Secondary | ICD-10-CM | POA: Diagnosis not present

## 2014-06-24 DIAGNOSIS — Z85118 Personal history of other malignant neoplasm of bronchus and lung: Secondary | ICD-10-CM | POA: Insufficient documentation

## 2014-06-24 DIAGNOSIS — Z8546 Personal history of malignant neoplasm of prostate: Secondary | ICD-10-CM

## 2014-06-24 DIAGNOSIS — Z9221 Personal history of antineoplastic chemotherapy: Secondary | ICD-10-CM

## 2014-06-24 DIAGNOSIS — C187 Malignant neoplasm of sigmoid colon: Secondary | ICD-10-CM | POA: Insufficient documentation

## 2014-06-24 DIAGNOSIS — C787 Secondary malignant neoplasm of liver and intrahepatic bile duct: Secondary | ICD-10-CM | POA: Insufficient documentation

## 2014-06-24 DIAGNOSIS — Z87891 Personal history of nicotine dependence: Secondary | ICD-10-CM | POA: Diagnosis not present

## 2014-06-24 DIAGNOSIS — Z452 Encounter for adjustment and management of vascular access device: Secondary | ICD-10-CM | POA: Insufficient documentation

## 2014-06-24 DIAGNOSIS — Z923 Personal history of irradiation: Secondary | ICD-10-CM

## 2014-06-24 DIAGNOSIS — R21 Rash and other nonspecific skin eruption: Secondary | ICD-10-CM | POA: Diagnosis not present

## 2014-06-24 LAB — CBC WITH DIFFERENTIAL/PLATELET
BASOS ABS: 0 10*3/uL (ref 0–0.1)
Basophils Relative: 1 %
Eosinophils Absolute: 0.2 10*3/uL (ref 0–0.7)
Eosinophils Relative: 4 %
HEMATOCRIT: 32.5 % — AB (ref 40.0–52.0)
Hemoglobin: 10.9 g/dL — ABNORMAL LOW (ref 13.0–18.0)
Lymphocytes Relative: 21 %
Lymphs Abs: 1.2 10*3/uL (ref 1.0–3.6)
MCH: 30 pg (ref 26.0–34.0)
MCHC: 33.5 g/dL (ref 32.0–36.0)
MCV: 89.6 fL (ref 80.0–100.0)
MONOS PCT: 10 %
Monocytes Absolute: 0.6 10*3/uL (ref 0.2–1.0)
NEUTROS ABS: 3.7 10*3/uL (ref 1.4–6.5)
Neutrophils Relative %: 64 %
Platelets: 110 10*3/uL — ABNORMAL LOW (ref 150–440)
RBC: 3.62 MIL/uL — ABNORMAL LOW (ref 4.40–5.90)
RDW: 15.5 % — AB (ref 11.5–14.5)
WBC: 5.7 10*3/uL (ref 3.8–10.6)

## 2014-06-24 LAB — COMPREHENSIVE METABOLIC PANEL
ALBUMIN: 3.9 g/dL (ref 3.5–5.0)
ALT: 14 U/L — ABNORMAL LOW (ref 17–63)
ANION GAP: 4 — AB (ref 5–15)
AST: 21 U/L (ref 15–41)
Alkaline Phosphatase: 118 U/L (ref 38–126)
BUN: 7 mg/dL (ref 6–20)
CO2: 28 mmol/L (ref 22–32)
CREATININE: 0.68 mg/dL (ref 0.61–1.24)
Calcium: 9.1 mg/dL (ref 8.9–10.3)
Chloride: 106 mmol/L (ref 101–111)
GFR calc Af Amer: >60 mL/min (ref >60)
Glucose, Bld: 106 mg/dL — ABNORMAL HIGH (ref 65–99)
Potassium: 3.6 mmol/L (ref 3.5–5.1)
Sodium: 138 mmol/L (ref 135–145)
Total Bilirubin: 0.3 mg/dL (ref 0.3–1.2)
Total Protein: 7.4 g/dL (ref 6.5–8.1)

## 2014-06-24 LAB — MAGNESIUM: MAGNESIUM: 1.9 mg/dL (ref 1.7–2.4)

## 2014-06-24 MED ORDER — DEXTROSE 5 % IV SOLN
Freq: Once | INTRAVENOUS | Status: AC
  Administered 2014-06-24: 11:00:00 via INTRAVENOUS
  Filled 2014-06-24: qty 1000

## 2014-06-24 MED ORDER — FLUOROURACIL CHEMO INJECTION 2.5 GM/50ML
350.0000 mg/m2 | Freq: Once | INTRAVENOUS | Status: AC
  Administered 2014-06-24: 650 mg via INTRAVENOUS
  Filled 2014-06-24: qty 13

## 2014-06-24 MED ORDER — DOXYCYCLINE HYCLATE 100 MG PO TABS: 100.0000 mg | ORAL_TABLET | Freq: Two times a day (BID) | ORAL | Status: DC

## 2014-06-24 MED ORDER — LEUCOVORIN CALCIUM INJECTION 350 MG
400.0000 mg/m2 | Freq: Once | INTRAVENOUS | Status: AC
  Administered 2014-06-24: 756 mg via INTRAVENOUS
  Filled 2014-06-24: qty 25

## 2014-06-24 MED ORDER — TRIAMCINOLONE 0.1 % CREAM:EUCERIN CREAM 1:1: 1.0000 "application " | TOPICAL_CREAM | Freq: Two times a day (BID) | CUTANEOUS | Status: DC

## 2014-06-24 MED ORDER — HEPARIN SOD (PORK) LOCK FLUSH 100 UNIT/ML IV SOLN: 500.0000 [IU] | Freq: Once | INTRAVENOUS | Status: DC | PRN

## 2014-06-24 MED ORDER — LIDOCAINE-PRILOCAINE 2.5-2.5 % EX CREA: TOPICAL_CREAM | CUTANEOUS | Status: DC

## 2014-06-24 MED ORDER — DEXTROSE 5 % IV SOLN
85.0000 mg/m2 | Freq: Once | INTRAVENOUS | Status: AC
  Administered 2014-06-24: 160 mg via INTRAVENOUS
  Filled 2014-06-24: qty 32

## 2014-06-24 MED ORDER — SODIUM CHLORIDE 0.9 % IJ SOLN
10.0000 mL | INTRAMUSCULAR | Status: DC | PRN
  Filled 2014-06-24: qty 10

## 2014-06-24 MED ORDER — SODIUM CHLORIDE 0.9 % IV SOLN
2000.0000 mg/m2 | INTRAVENOUS | Status: DC
  Administered 2014-06-24: 3800 mg via INTRAVENOUS
  Filled 2014-06-24: qty 76

## 2014-06-24 MED ORDER — SODIUM CHLORIDE 0.9 % IV SOLN
Freq: Once | INTRAVENOUS | Status: AC
  Administered 2014-06-24: 11:00:00 via INTRAVENOUS
  Filled 2014-06-24: qty 4

## 2014-06-24 NOTE — Progress Notes (Signed)
[place] @ [facility] [phone]  [phone]     [name] OB: November 03, 1940  MR#: [id number]  TXM#:[id number]  Patient Care Team: No Pcp Per Patient as PCP - General (General Practice) [name], MD (Oncology) [name], MD (General Surgery)  CHIEF COMPLAINT:  Chief Complaint  Patient presents with  . Follow-up  . Colon Cancer    Oncology History   Chief Complaint/Diagnosis:   66. 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 MRI scan of brain shows the right parietal lobe lesion (T4, N2, M1 disease) September, 2014 stage IV 3. Stereotactic radiation therapy to the brain been arranged.  Patient was started on carboplatinum and VP-16. 4. Chest radiation started on December, 2014 5. Patient finished radiation to the chest in January of 2015.  Received total 6000 rads 6. Finished consolidation chemotherapy on March 14, 2013 7. PET scan shows progressive disease in the liver.  September, 2015 8. Started on carboplatinum and CPT-11 from November 14, 2013 9.cancer of the mid sigmoid colon.  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 post sigmoid colectomy and liver biopsy March of 2016 T3 N0 M1 disease stage IV K-ras wild type HPI:        Carcinoma of sigmoid colon   04/24/2014 Initial Diagnosis Carcinoma of sigmoid colon    No flowsheet data found.  INTERVAL HISTORY:  Patient is here for ongoing evaluation and continuation of chemotherapy.  As developed rash on the face secondary to [place].  No chills.  No fever.  No nausea.  No vomiting.  No diarrhea.  No tingling or numbness.  REVIEW OF SYSTEMS:   Review of Systems  Constitutional: Negative for fever, chills, weight loss, malaise/fatigue and diaphoresis.  HENT: Negative for congestion, ear discharge, ear pain, hearing loss, nosebleeds, sore throat  and tinnitus.   Eyes: Negative for blurred vision, double vision, photophobia, pain, discharge and redness.  Respiratory: Negative for cough, hemoptysis, sputum production, shortness of breath, wheezing and stridor.   Cardiovascular: Negative for chest pain, palpitations, orthopnea, claudication, leg swelling and PND.  Gastrointestinal: Negative for heartburn, nausea, vomiting, abdominal pain, diarrhea, constipation, blood in stool and melena.  Genitourinary: Negative for dysuria, urgency, frequency, hematuria and flank pain.  Musculoskeletal: Negative for myalgias, back pain, joint pain, falls and neck pain.  Skin: Positive for itching and rash (on face and on the back).  Neurological: Negative for dizziness, tingling, tremors, sensory change, speech change, focal weakness, seizures, loss of consciousness, weakness and headaches.  Endo/Heme/Allergies: Negative for environmental allergies and polydipsia. Does not bruise/bleed easily.  Psychiatric/Behavioral: Negative for depression, suicidal ideas, hallucinations, memory loss and substance abuse. The patient is not nervous/anxious and does not have insomnia.     As per HPI. Otherwise, a complete review of systems is negatve.  PAST MEDICAL HISTORY: Past Medical History  Diagnosis Date  . Kidney stones 1975  . Lung cancer 2014    Radiation therapy  . Liver cancer   . Colon cancer 01/2014  . Prostate cancer 2005    treated by Dr Eliberto Ivory  . Brain cancer 01/2014    Chemotherapy and radiation treatment    PAST SURGICAL HISTORY: Past Surgical History  Procedure Laterality Date  . Kidney stones  1975  . Prostate surgery  2005    partial removal due to cancer  . Colon surgery  05-03-14    Resection of Sigmoid Colon  and liver biopsy  . Portacath placement  2014    FAMILY HISTORY Family History  Problem Relation Age of Onset  . Lung cancer Father   . Alzheimer's disease Mother        ADVANCED DIRECTIVES:    HEALTH  MAINTENANCE: History  Substance Use Topics  . Smoking status: Former Smoker -- 35 years  . Smokeless tobacco: Never Used  . Alcohol Use: No     Colonoscopy:  PAP:  Bone density:  Lipid panel:  No Known Allergies  Current Outpatient Prescriptions  Medication Sig Dispense Refill  . ibuprofen (ADVIL,MOTRIN) 200 MG tablet Take 200 mg by mouth every 6 (six) hours as needed.    Marland Kitchen oxyCODONE (OXY IR/ROXICODONE) 5 MG immediate release tablet Take 5 mg by mouth every 6 (six) hours as needed.     Marland Kitchen PROVENTIL HFA 108 (90 BASE) MCG/ACT inhaler Inhale 90 mcg into the lungs every 6 (six) hours as needed.    . doxycycline (VIBRA-TABS) 100 MG tablet Take 1 tablet (100 mg total) by mouth 2 (two) times daily. 60 tablet 3  . lidocaine-prilocaine (EMLA) cream Apply to affected area once 30 g 3   No current facility-administered medications for this visit.    OBJECTIVE: Filed Vitals:   06/24/14 0941  BP: 129/88  Pulse: 94     Body mass index is 19.41 kg/(m^2).    ECOG FS:1 - Symptomatic but completely ambulatory  Physical Exam  Constitutional: No distress.  HENT:  Head: Normocephalic and atraumatic.  Right Ear: External ear normal.  Left Ear: External ear normal.  Nose: Nose normal.  Mouth/Throat: Oropharynx is clear and moist.  Eyes: Conjunctivae and EOM are normal. Pupils are equal, round, and reactive to light.  Neck: Normal range of motion. Neck supple. No JVD present. No tracheal deviation present. No thyromegaly present.  Cardiovascular: Normal rate, normal heart sounds and intact distal pulses.  Exam reveals no gallop and no friction rub.   No murmur heard. Pulmonary/Chest: No stridor. No respiratory distress. He has no wheezes. He has no rales. He exhibits no tenderness.  Abdominal: He exhibits no distension and no mass. There is no tenderness. There is no rebound and no guarding.  Musculoskeletal: He exhibits no edema or tenderness.  Neurological: He displays normal reflexes. No  cranial nerve deficit. He exhibits normal muscle tone. Coordination normal.  Skin: Rash (maculopapular rash) noted. He is not diaphoretic. No erythema.  Psychiatric: Memory, affect and judgment normal.  Nursing note and vitals reviewed.    LAB RESULTS:     Component Value Date/Time   NA 138 06/24/2014 0814   NA 136 06/10/2014 0829   K 3.6 06/24/2014 0814   K 3.6 06/10/2014 0829   CL 106 06/24/2014 0814   CL 105 06/10/2014 0829   CO2 28 06/24/2014 0814   CO2 27 06/10/2014 0829   GLUCOSE 106* 06/24/2014 0814   GLUCOSE 101* 06/10/2014 0829   BUN 7 06/24/2014 0814   BUN 8 06/10/2014 0829   CREATININE 0.68 06/24/2014 0814   CREATININE 0.79 06/10/2014 0829   CALCIUM 9.1 06/24/2014 0814   CALCIUM 9.3 06/10/2014 0829   PROT 7.4 06/24/2014 0814   PROT 7.5 06/10/2014 0829   ALBUMIN 3.9 06/24/2014 0814   ALBUMIN 4.0 06/10/2014 0829   AST 21 06/24/2014 0814   AST 28 06/10/2014 0829   ALT 14* 06/24/2014 0814   ALT 14* 06/10/2014 0829   ALKPHOS 118 06/24/2014 0814   ALKPHOS 134* 06/10/2014 0829  BILITOT 0.3 06/24/2014 0814   GFRNONAA >60 06/24/2014 0814   GFRNONAA >60 06/10/2014 0829   GFRAA >60 06/24/2014 0814   GFRAA >60 06/10/2014 0829    No results found for: SPEP, UPEP  Lab Results  Component Value Date   WBC 5.7 06/24/2014   NEUTROABS 3.7 06/24/2014   HGB 10.9* 06/24/2014   HCT 32.5* 06/24/2014   MCV 89.6 06/24/2014   PLT 110* 06/24/2014      Chemistry      Component Value Date/Time   NA 138 06/24/2014 0814   NA 136 06/10/2014 0829   K 3.6 06/24/2014 0814   K 3.6 06/10/2014 0829   CL 106 06/24/2014 0814   CL 105 06/10/2014 0829   CO2 28 06/24/2014 0814   CO2 27 06/10/2014 0829   BUN 7 06/24/2014 0814   BUN 8 06/10/2014 0829   CREATININE 0.68 06/24/2014 0814   CREATININE 0.79 06/10/2014 0829      Component Value Date/Time   CALCIUM 9.1 06/24/2014 0814   CALCIUM 9.3 06/10/2014 0829   ALKPHOS 118 06/24/2014 0814   ALKPHOS 134* 06/10/2014 0829   AST  21 06/24/2014 0814   AST 28 06/10/2014 0829   ALT 14* 06/24/2014 0814   ALT 14* 06/10/2014 0829   BILITOT 0.3 06/24/2014 0814           STUDIES   ASSESSMENT:   stage IV undifferentiated carcinoma (small cell) of lung There is no evidence of recurrent disease.2.rectal bleeding.  Colonoscopy revealed mid sigmoid colon cancer Biopsies positive for invasive carcinoma. mets  to liver 3.I had prolonged discussion with patient regarding new problem of carcinoma of colon metastases to the liver  PLAN:   Continue chemotherapy. Rash Secondary to VECTIBIX And doxycycline Eucerin and Aristocort cream  Patient expressed understanding and was in agreement with this plan. He also understands that He can call clinic at any time with any questions, concerns, or complaints.    Carcinoma of sigmoid colon   Staging form: Colon and Rectum, AJCC 7th Edition     Clinical: Stage IVB (yT3, N1a, M1b) - Marni Griffon, MD   06/24/2014 10:38 AM

## 2014-06-26 ENCOUNTER — Inpatient Hospital Stay: Payer: BC Managed Care – PPO

## 2014-06-26 VITALS — BP 123/83 | HR 92 | Temp 97.6°F | Resp 18

## 2014-06-26 DIAGNOSIS — C187 Malignant neoplasm of sigmoid colon: Secondary | ICD-10-CM

## 2014-06-26 MED ORDER — SODIUM CHLORIDE 0.9 % IJ SOLN
10.0000 mL | INTRAMUSCULAR | Status: DC | PRN
  Administered 2014-06-26: 10 mL
  Filled 2014-06-26: qty 10

## 2014-06-26 MED ORDER — HEPARIN SOD (PORK) LOCK FLUSH 100 UNIT/ML IV SOLN
500.0000 [IU] | Freq: Once | INTRAVENOUS | Status: AC | PRN
  Administered 2014-06-26: 500 [IU]

## 2014-06-26 MED ORDER — HEPARIN SOD (PORK) LOCK FLUSH 100 UNIT/ML IV SOLN
INTRAVENOUS | Status: AC
  Filled 2014-06-26: qty 5

## 2014-07-08 ENCOUNTER — Inpatient Hospital Stay (HOSPITAL_BASED_OUTPATIENT_CLINIC_OR_DEPARTMENT_OTHER): Payer: BC Managed Care – PPO | Admitting: Oncology

## 2014-07-08 ENCOUNTER — Inpatient Hospital Stay: Payer: BC Managed Care – PPO

## 2014-07-08 VITALS — BP 138/84 | HR 97 | Temp 97.8°F | Wt 152.1 lb

## 2014-07-08 VITALS — BP 127/84 | HR 78 | Resp 18

## 2014-07-08 DIAGNOSIS — Z8546 Personal history of malignant neoplasm of prostate: Secondary | ICD-10-CM

## 2014-07-08 DIAGNOSIS — Z85118 Personal history of other malignant neoplasm of bronchus and lung: Secondary | ICD-10-CM

## 2014-07-08 DIAGNOSIS — L270 Generalized skin eruption due to drugs and medicaments taken internally: Secondary | ICD-10-CM

## 2014-07-08 DIAGNOSIS — C187 Malignant neoplasm of sigmoid colon: Secondary | ICD-10-CM

## 2014-07-08 DIAGNOSIS — C787 Secondary malignant neoplasm of liver and intrahepatic bile duct: Secondary | ICD-10-CM | POA: Diagnosis not present

## 2014-07-08 DIAGNOSIS — Z87891 Personal history of nicotine dependence: Secondary | ICD-10-CM

## 2014-07-08 DIAGNOSIS — Z801 Family history of malignant neoplasm of trachea, bronchus and lung: Secondary | ICD-10-CM

## 2014-07-08 DIAGNOSIS — Z79899 Other long term (current) drug therapy: Secondary | ICD-10-CM

## 2014-07-08 LAB — COMPREHENSIVE METABOLIC PANEL
ALT: 13 U/L — ABNORMAL LOW (ref 17–63)
AST: 24 U/L (ref 15–41)
Albumin: 4 g/dL (ref 3.5–5.0)
Alkaline Phosphatase: 98 U/L (ref 38–126)
Anion gap: 5 (ref 5–15)
BUN: 8 mg/dL (ref 6–20)
CHLORIDE: 106 mmol/L (ref 101–111)
CO2: 25 mmol/L (ref 22–32)
Calcium: 9.2 mg/dL (ref 8.9–10.3)
Creatinine, Ser: 0.77 mg/dL (ref 0.61–1.24)
GFR calc Af Amer: >60 mL/min (ref >60)
GFR calc non Af Amer: >60 mL/min (ref >60)
Glucose, Bld: 91 mg/dL (ref 65–99)
POTASSIUM: 3.6 mmol/L (ref 3.5–5.1)
Sodium: 136 mmol/L (ref 135–145)
TOTAL PROTEIN: 7.3 g/dL (ref 6.5–8.1)
Total Bilirubin: 0.4 mg/dL (ref 0.3–1.2)

## 2014-07-08 LAB — MAGNESIUM: MAGNESIUM: 1.8 mg/dL (ref 1.7–2.4)

## 2014-07-08 LAB — CBC WITH DIFFERENTIAL/PLATELET
BASOS PCT: 1 %
Basophils Absolute: 0 10*3/uL (ref 0–0.1)
EOS ABS: 0.3 10*3/uL (ref 0–0.7)
EOS PCT: 10 %
HCT: 32.1 % — ABNORMAL LOW (ref 40.0–52.0)
Hemoglobin: 10.7 g/dL — ABNORMAL LOW (ref 13.0–18.0)
Lymphocytes Relative: 31 %
Lymphs Abs: 1 10*3/uL (ref 1.0–3.6)
MCH: 30 pg (ref 26.0–34.0)
MCHC: 33.2 g/dL (ref 32.0–36.0)
MCV: 90.4 fL (ref 80.0–100.0)
MONO ABS: 0.6 10*3/uL (ref 0.2–1.0)
Monocytes Relative: 17 %
NEUTROS ABS: 1.4 10*3/uL (ref 1.4–6.5)
Neutrophils Relative %: 41 %
Platelets: 100 10*3/uL — ABNORMAL LOW (ref 150–440)
RBC: 3.55 MIL/uL — ABNORMAL LOW (ref 4.40–5.90)
RDW: 16 % — AB (ref 11.5–14.5)
WBC: 3.3 10*3/uL — AB (ref 3.8–10.6)

## 2014-07-08 MED ORDER — SODIUM CHLORIDE 0.9 % IV SOLN: Freq: Once | INTRAVENOUS | Status: DC

## 2014-07-08 MED ORDER — FLUOROURACIL CHEMO INJECTION 2.5 GM/50ML
350.0000 mg/m2 | Freq: Once | INTRAVENOUS | Status: AC
  Administered 2014-07-08: 650 mg via INTRAVENOUS
  Filled 2014-07-08: qty 13

## 2014-07-08 MED ORDER — SODIUM CHLORIDE 0.9 % IV SOLN
400.0000 mg | Freq: Once | INTRAVENOUS | Status: AC
  Administered 2014-07-08: 400 mg via INTRAVENOUS
  Filled 2014-07-08: qty 20

## 2014-07-08 MED ORDER — DEXTROSE 5 % IV SOLN
Freq: Once | INTRAVENOUS | Status: AC
  Administered 2014-07-08: 13:00:00 via INTRAVENOUS
  Filled 2014-07-08: qty 250

## 2014-07-08 MED ORDER — FOSAPREPITANT DIMEGLUMINE INJECTION 150 MG
Freq: Once | INTRAVENOUS | Status: AC
  Administered 2014-07-08: 11:00:00 via INTRAVENOUS
  Filled 2014-07-08: qty 5

## 2014-07-08 MED ORDER — SODIUM CHLORIDE 0.9 % IV SOLN
Freq: Once | INTRAVENOUS | Status: AC
  Administered 2014-07-08: 11:00:00 via INTRAVENOUS
  Filled 2014-07-08: qty 250

## 2014-07-08 MED ORDER — PALONOSETRON HCL INJECTION 0.25 MG/5ML
0.2500 mg | Freq: Once | INTRAVENOUS | Status: AC
  Administered 2014-07-08: 0.25 mg via INTRAVENOUS
  Filled 2014-07-08: qty 5

## 2014-07-08 MED ORDER — LEUCOVORIN CALCIUM INJECTION 350 MG
750.0000 mg | Freq: Once | INTRAVENOUS | Status: AC
  Administered 2014-07-08: 750 mg via INTRAVENOUS
  Filled 2014-07-08: qty 17.5

## 2014-07-08 MED ORDER — HEPARIN SOD (PORK) LOCK FLUSH 100 UNIT/ML IV SOLN: 500.0000 [IU] | Freq: Once | INTRAVENOUS | Status: DC | PRN

## 2014-07-08 MED ORDER — SODIUM CHLORIDE 0.9 % IV SOLN
2000.0000 mg/m2 | INTRAVENOUS | Status: DC
  Administered 2014-07-08: 3800 mg via INTRAVENOUS
  Filled 2014-07-08: qty 76

## 2014-07-08 MED ORDER — OXALIPLATIN CHEMO INJECTION 100 MG/20ML
85.0000 mg/m2 | Freq: Once | INTRAVENOUS | Status: AC
  Administered 2014-07-08: 160 mg via INTRAVENOUS
  Filled 2014-07-08: qty 32

## 2014-07-09 ENCOUNTER — Encounter: Payer: Self-pay | Admitting: Oncology

## 2014-07-09 NOTE — Progress Notes (Signed)
[place] @ [facility] Telephone:(336) [phone]  Fax:(336) [phone]     Gregg Winchell OB: 1940/09/05  MR#: [id number]  AOZ#:[id number]  Patient Care Team: No Pcp Per Patient as PCP - General (General Practice) [name], MD (Oncology) [name], MD (General Surgery)  CHIEF COMPLAINT:  Chief Complaint  Patient presents with  . Follow-up    Oncology History   Chief Complaint/Diagnosis:   72. 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 MRI scan of brain shows the right parietal lobe lesion (T4, N2, M1 disease) September, 2014 stage IV 3. Stereotactic radiation therapy to the brain been arranged.  Patient was started on carboplatinum and VP-16. 4. Chest radiation started on December, 2014 5. Patient finished radiation to the chest in January of 2015.  Received total 6000 rads 6. Finished consolidation chemotherapy on March 14, 2013 7. PET scan shows progressive disease in the liver.  September, 2015 8. Started on carboplatinum and CPT-11 from November 14, 2013 9.cancer of the mid sigmoid colon.  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 post sigmoid colectomy and liver biopsy March of 2016 T3 N0 M1 disease stage IV K-ras wild type HPI:        Carcinoma of sigmoid colon   04/24/2014 Initial Diagnosis Carcinoma of sigmoid colon    Oncology Flowsheet 06/24/2014 06/24/2014 07/08/2014 07/08/2014  Day, Cycle Day 1, Cycle 1   Day 1, Cycle 2    dexamethasone (DECADRON) IV [ 10 mg ]   [ 12 mg ]    fluorouracil (ADRUCIL) IV 350 mg/m2 2,000 mg/m2 350 mg/m2 2,000 mg/m2  fosaprepitant (EMEND) IV - - [ 150 mg ]    leucovorin IV 400 mg/m2   750 mg    ondansetron (ZOFRAN) IV [ 8 mg ]   - -  oxaliplatin (ELOXATIN) IV 85 mg/m2   85 mg/m2    palonosetron (ALOXI) IV - - 0.25 mg    panitumumab (VECTIBIX) IV - - 400 mg      INTERVAL HISTORY:    Patient is here for ongoing evaluation and continuation of chemotherapy.  As developed rash on the face secondary to [place].  No chills.  No fever.  No nausea.  No vomiting.  No diarrhea.  No tingling or numbness. He  is here for the next cycle of chemotherapy.. After starting doxycycline rash is improved.  No diarrhea.  No abdominal pain.  No nausea.  No vomiting.  No tearing.  No numbness.  REVIEW OF SYSTEMS:   ROS Gen. status: Alert oriented not any acute distress. HEENT: No headache.  No dizziness.  No soreness in the mouth.  No difficulty in swallowing.  No neck pain.  No palpable masses in the neck Lungs: No cough.  No hemoptysis.  No chest pain.  No shortness of breath. Cardiac: No chest pain.  No paroxysmal dyspnea.  No orthopnea.  No palpitation Abdomen: No abdominal pain.  No diarrhea.  No nausea.  No vomiting.  Appetite is stable.  No rectal bleeding. Skin: No ecchymosis.   rash.  Improving.  No itching. Neuro: No headache.  No dizziness.  No weakness in upper or lower extremity.  No evidence of tingling numbness.. Lower extremity no edema As per HPI. Otherwise, a complete review of systems is negatve.  PAST MEDICAL HISTORY: Past Medical History  Diagnosis Date  . Kidney stones 1975  . Lung cancer 2014  Radiation therapy  . Liver cancer   . Colon cancer 01/2014  . Prostate cancer 2005    treated by Dr Eliberto Ivory  . Brain cancer 01/2014    Chemotherapy and radiation treatment    PAST SURGICAL HISTORY: Past Surgical History  Procedure Laterality Date  . Kidney stones  1975  . Prostate surgery  2005    partial removal due to cancer  . Colon surgery  05-03-14    Resection of Sigmoid Colon and liver biopsy  . Portacath placement  2014    FAMILY HISTORY Family History  Problem Relation Age of Onset  . Lung cancer Father   . Alzheimer's disease Mother        ADVANCED DIRECTIVES:    HEALTH MAINTENANCE: History  Substance Use Topics  . Smoking status: Former  Smoker -- 35 years  . Smokeless tobacco: Never Used  . Alcohol Use: No      No Known Allergies  Current Outpatient Prescriptions  Medication Sig Dispense Refill  . doxycycline (VIBRA-TABS) 100 MG tablet Take 1 tablet (100 mg total) by mouth 2 (two) times daily. 60 tablet 3  . ibuprofen (ADVIL,MOTRIN) 200 MG tablet Take 200 mg by mouth every 6 (six) hours as needed.    . lidocaine-prilocaine (EMLA) cream Apply to affected area once 30 g 3  . oxyCODONE (OXY IR/ROXICODONE) 5 MG immediate release tablet Take 5 mg by mouth every 6 (six) hours as needed.     Marland Kitchen PROVENTIL HFA 108 (90 BASE) MCG/ACT inhaler Inhale 90 mcg into the lungs every 6 (six) hours as needed.    . Triamcinolone Acetonide (TRIAMCINOLONE 0.1 % CREAM : EUCERIN) CREA Apply 1 application topically 2 (two) times daily. 1 each 3   No current facility-administered medications for this visit.    OBJECTIVE: Filed Vitals:   07/08/14 0914  BP: 138/84  Pulse: 97  Temp: 97.8 F (36.6 C)     Body mass index is 19.52 kg/(m^2).    ECOG FS:1 - Symptomatic but completely ambulatory  Physical Exam  Constitutional: No distress.  HENT:  Head: Normocephalic and atraumatic.  Right Ear: External ear normal.  Left Ear: External ear normal.  Nose: Nose normal.  Mouth/Throat: Oropharynx is clear and moist.  Eyes: Conjunctivae and EOM are normal. Pupils are equal, round, and reactive to light.  Neck: Normal range of motion. Neck supple. No JVD present. No tracheal deviation present. No thyromegaly present.  Cardiovascular: Normal rate, normal heart sounds and intact distal pulses.  Exam reveals no gallop and no friction rub.   No murmur heard. Pulmonary/Chest: No stridor. No respiratory distress. He has no wheezes. He has no rales. He exhibits no tenderness.  Abdominal: He exhibits no distension and no mass. There is no tenderness. There is no rebound and no guarding.  Musculoskeletal: He exhibits no edema or tenderness.  Neurological:  He displays normal reflexes. No cranial nerve deficit. He exhibits normal muscle tone. Coordination normal.  Skin: Rash (maculopapular rash) noted. He is not diaphoretic. No erythema.  Psychiatric: Memory, affect and judgment normal.  Nursing note and vitals reviewed.    LAB RESULTS:     Component Value Date/Time   NA 136 07/08/2014 0858   NA 136 06/10/2014 0829   K 3.6 07/08/2014 0858   K 3.6 06/10/2014 0829   CL 106 07/08/2014 0858   CL 105 06/10/2014 0829   CO2 25 07/08/2014 0858   CO2 27 06/10/2014 0829   GLUCOSE 91 07/08/2014 0858   GLUCOSE  101* 06/10/2014 0829   BUN 8 07/08/2014 0858   BUN 8 06/10/2014 0829   CREATININE 0.77 07/08/2014 0858   CREATININE 0.79 06/10/2014 0829   CALCIUM 9.2 07/08/2014 0858   CALCIUM 9.3 06/10/2014 0829   PROT 7.3 07/08/2014 0858   PROT 7.5 06/10/2014 0829   ALBUMIN 4.0 07/08/2014 0858   ALBUMIN 4.0 06/10/2014 0829   AST 24 07/08/2014 0858   AST 28 06/10/2014 0829   ALT 13* 07/08/2014 0858   ALT 14* 06/10/2014 0829   ALKPHOS 98 07/08/2014 0858   ALKPHOS 134* 06/10/2014 0829   BILITOT 0.4 07/08/2014 0858   GFRNONAA >60 07/08/2014 0858   GFRNONAA >60 06/10/2014 0829   GFRAA >60 07/08/2014 0858   GFRAA >60 06/10/2014 0829    No results found for: SPEP, UPEP  Lab Results  Component Value Date   WBC 3.3* 07/08/2014   NEUTROABS 1.4 07/08/2014   HGB 10.7* 07/08/2014   HCT 32.1* 07/08/2014   MCV 90.4 07/08/2014   PLT 100* 07/08/2014      Chemistry      Component Value Date/Time   NA 136 07/08/2014 0858   NA 136 06/10/2014 0829   K 3.6 07/08/2014 0858   K 3.6 06/10/2014 0829   CL 106 07/08/2014 0858   CL 105 06/10/2014 0829   CO2 25 07/08/2014 0858   CO2 27 06/10/2014 0829   BUN 8 07/08/2014 0858   BUN 8 06/10/2014 0829   CREATININE 0.77 07/08/2014 0858   CREATININE 0.79 06/10/2014 0829      Component Value Date/Time   CALCIUM 9.2 07/08/2014 0858   CALCIUM 9.3 06/10/2014 0829   ALKPHOS 98 07/08/2014 0858   ALKPHOS  134* 06/10/2014 0829   AST 24 07/08/2014 0858   AST 28 06/10/2014 0829   ALT 13* 07/08/2014 0858   ALT 14* 06/10/2014 0829   BILITOT 0.4 07/08/2014 0858           STUDIES   ASSESSMENT:   stage IV undifferentiated carcinoma (small cell) of lung There is no evidence of recurrent disease.2.rectal bleeding.  Colonoscopy revealed mid sigmoid colon cancer Biopsies positive for invasive carcinoma. mets  to liver Continue chemotherapy  PLAN:   Continue chemotherapy. Rash Secondary to VECTIBIX And doxycycline Eucerin and Aristocort cream Rash is improving Continue chemotherapy Reassessment with CEA  Patient expressed understanding and was in agreement with this plan. He also understands that He can call clinic at any time with any questions, concerns, or complaints.    Carcinoma of sigmoid colon   Staging form: Colon and Rectum, AJCC 7th Edition     Clinical: Stage IVB (yT3, N1a, M1b) - Marni Griffon, MD   07/09/2014 8:37 AM

## 2014-07-10 ENCOUNTER — Inpatient Hospital Stay: Payer: BC Managed Care – PPO

## 2014-07-10 VITALS — BP 129/74 | HR 71 | Resp 18

## 2014-07-10 DIAGNOSIS — C187 Malignant neoplasm of sigmoid colon: Secondary | ICD-10-CM | POA: Diagnosis not present

## 2014-07-10 MED ORDER — SODIUM CHLORIDE 0.9 % IJ SOLN
10.0000 mL | INTRAMUSCULAR | Status: AC | PRN
Start: 2014-07-10 — End: ?
  Administered 2014-07-10: 10 mL
  Filled 2014-07-10: qty 10

## 2014-07-10 MED ORDER — HEPARIN SOD (PORK) LOCK FLUSH 100 UNIT/ML IV SOLN
500.0000 [IU] | Freq: Once | INTRAVENOUS | Status: AC | PRN
  Administered 2014-07-10: 500 [IU]
  Filled 2014-07-10: qty 5

## 2014-07-22 ENCOUNTER — Inpatient Hospital Stay (HOSPITAL_BASED_OUTPATIENT_CLINIC_OR_DEPARTMENT_OTHER): Payer: BC Managed Care – PPO | Admitting: Oncology

## 2014-07-22 ENCOUNTER — Encounter: Payer: Self-pay | Admitting: Oncology

## 2014-07-22 ENCOUNTER — Inpatient Hospital Stay: Payer: BC Managed Care – PPO | Attending: Oncology

## 2014-07-22 ENCOUNTER — Inpatient Hospital Stay: Payer: BC Managed Care – PPO

## 2014-07-22 VITALS — BP 144/84 | HR 82 | Temp 97.4°F | Wt 148.4 lb

## 2014-07-22 DIAGNOSIS — R21 Rash and other nonspecific skin eruption: Secondary | ICD-10-CM | POA: Diagnosis not present

## 2014-07-22 DIAGNOSIS — Z87442 Personal history of urinary calculi: Secondary | ICD-10-CM | POA: Insufficient documentation

## 2014-07-22 DIAGNOSIS — Z79899 Other long term (current) drug therapy: Secondary | ICD-10-CM

## 2014-07-22 DIAGNOSIS — Z801 Family history of malignant neoplasm of trachea, bronchus and lung: Secondary | ICD-10-CM

## 2014-07-22 DIAGNOSIS — C787 Secondary malignant neoplasm of liver and intrahepatic bile duct: Secondary | ICD-10-CM | POA: Insufficient documentation

## 2014-07-22 DIAGNOSIS — C3411 Malignant neoplasm of upper lobe, right bronchus or lung: Secondary | ICD-10-CM | POA: Diagnosis not present

## 2014-07-22 DIAGNOSIS — Z5111 Encounter for antineoplastic chemotherapy: Secondary | ICD-10-CM | POA: Insufficient documentation

## 2014-07-22 DIAGNOSIS — C187 Malignant neoplasm of sigmoid colon: Secondary | ICD-10-CM | POA: Insufficient documentation

## 2014-07-22 DIAGNOSIS — Z85841 Personal history of malignant neoplasm of brain: Secondary | ICD-10-CM

## 2014-07-22 DIAGNOSIS — Z8546 Personal history of malignant neoplasm of prostate: Secondary | ICD-10-CM | POA: Diagnosis not present

## 2014-07-22 DIAGNOSIS — C7931 Secondary malignant neoplasm of brain: Secondary | ICD-10-CM | POA: Diagnosis not present

## 2014-07-22 DIAGNOSIS — Z87891 Personal history of nicotine dependence: Secondary | ICD-10-CM

## 2014-07-22 DIAGNOSIS — Z85118 Personal history of other malignant neoplasm of bronchus and lung: Secondary | ICD-10-CM

## 2014-07-22 LAB — CBC WITH DIFFERENTIAL/PLATELET
Basophils Absolute: 0 10*3/uL (ref 0–0.1)
Basophils Relative: 1 %
Eosinophils Absolute: 0.1 10*3/uL (ref 0–0.7)
Eosinophils Relative: 3 %
HEMATOCRIT: 33 % — AB (ref 40.0–52.0)
HEMOGLOBIN: 10.9 g/dL — AB (ref 13.0–18.0)
LYMPHS PCT: 44 %
Lymphs Abs: 1.2 10*3/uL (ref 1.0–3.6)
MCH: 29.7 pg (ref 26.0–34.0)
MCHC: 32.9 g/dL (ref 32.0–36.0)
MCV: 90.3 fL (ref 80.0–100.0)
Monocytes Absolute: 0.5 10*3/uL (ref 0.2–1.0)
Monocytes Relative: 18 %
Neutro Abs: 0.9 10*3/uL — ABNORMAL LOW (ref 1.4–6.5)
Neutrophils Relative %: 34 %
Platelets: 108 10*3/uL — ABNORMAL LOW (ref 150–440)
RBC: 3.66 MIL/uL — AB (ref 4.40–5.90)
RDW: 18.3 % — AB (ref 11.5–14.5)
WBC: 2.7 10*3/uL — AB (ref 3.8–10.6)

## 2014-07-22 LAB — COMPREHENSIVE METABOLIC PANEL
ALBUMIN: 4 g/dL (ref 3.5–5.0)
ALT: 16 U/L — AB (ref 17–63)
AST: 24 U/L (ref 15–41)
Alkaline Phosphatase: 90 U/L (ref 38–126)
Anion gap: 2 — ABNORMAL LOW (ref 5–15)
BUN: 12 mg/dL (ref 6–20)
CO2: 27 mmol/L (ref 22–32)
Calcium: 9.1 mg/dL (ref 8.9–10.3)
Chloride: 106 mmol/L (ref 101–111)
Creatinine, Ser: 0.78 mg/dL (ref 0.61–1.24)
GFR calc Af Amer: >60 mL/min (ref >60)
GFR calc non Af Amer: >60 mL/min (ref >60)
Glucose, Bld: 91 mg/dL (ref 65–99)
POTASSIUM: 3.5 mmol/L (ref 3.5–5.1)
SODIUM: 135 mmol/L (ref 135–145)
TOTAL PROTEIN: 7.2 g/dL (ref 6.5–8.1)
Total Bilirubin: 0.6 mg/dL (ref 0.3–1.2)

## 2014-07-22 MED ORDER — HEPARIN SOD (PORK) LOCK FLUSH 100 UNIT/ML IV SOLN
500.0000 [IU] | Freq: Once | INTRAVENOUS | Status: AC
Start: 2014-07-22 — End: 2014-07-22
  Administered 2014-07-22: 500 [IU] via INTRAVENOUS

## 2014-07-22 MED ORDER — HEPARIN SOD (PORK) LOCK FLUSH 100 UNIT/ML IV SOLN
INTRAVENOUS | Status: AC
  Filled 2014-07-22: qty 5

## 2014-07-22 MED ORDER — SODIUM CHLORIDE 0.9 % IJ SOLN
10.0000 mL | Freq: Once | INTRAMUSCULAR | Status: AC
  Administered 2014-07-22: 10 mL via INTRAVENOUS
  Filled 2014-07-22: qty 10

## 2014-07-22 NOTE — Progress Notes (Signed)
Patient former smoker.  Does not have living will. Requesting refill for Eucerin.

## 2014-07-22 NOTE — Progress Notes (Signed)
[place] @ [facility] Telephone:(336) [phone]  Fax:(336) [phone]     Kurtis Anastasia OB: [birth year]  MR#: [id number]  FHQ#:[id number]  Patient Care Team: No Pcp Per Patient as PCP - General (General Practice) [name], MD (Oncology) [name], MD (General Surgery)  CHIEF COMPLAINT:  Chief Complaint  Patient presents with  . Follow-up    Oncology History   Chief Complaint/Diagnosis:   67. 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 MRI scan of brain shows the right parietal lobe lesion (T4, N2, M1 disease) September, 2014 stage IV 3. Stereotactic radiation therapy to the brain been arranged.  Patient was started on carboplatinum and VP-16. 4. Chest radiation started on December, 2014 5. Patient finished radiation to the chest in January of 2015.  Received total 6000 rads 6. Finished consolidation chemotherapy on March 14, 2013 7. PET scan shows progressive disease in the liver.  September, 2015 8. Started on carboplatinum and CPT-11 from November 14, 2013 9.cancer of the mid sigmoid colon.  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 post sigmoid colectomy and liver biopsy March of 2016 T3 N0 M1 disease stage IV K-ras wild type HPI:        Carcinoma of sigmoid colon   04/24/2014 Initial Diagnosis Carcinoma of sigmoid colon    Oncology Flowsheet 06/24/2014 06/24/2014 07/08/2014 07/08/2014  Day, Cycle Day 1, Cycle 1   Day 1, Cycle 2    dexamethasone (DECADRON) IV [ 10 mg ]   [ 12 mg ]    fluorouracil (ADRUCIL) IV 350 mg/m2 2,000 mg/m2 350 mg/m2 2,000 mg/m2  fosaprepitant (EMEND) IV - - [ 150 mg ]    leucovorin IV 400 mg/m2   750 mg    ondansetron (ZOFRAN) IV [ 8 mg ]   - -  oxaliplatin (ELOXATIN) IV 85 mg/m2   85 mg/m2    palonosetron (ALOXI) IV - - 0.25 mg    panitumumab (VECTIBIX) IV - - 400 mg      INTERVAL HISTORY:    Patient is here for ongoing evaluation and continuation of chemotherapy.  As developed rash on the face secondary to [place].  No chills.  No fever.  No nausea.  No vomiting.  No diarrhea.  No tingling or numbness. He  is here for the next cycle of chemotherapy.. After starting doxycycline rash is improved.  No diarrhea.  No abdominal pain.  No nausea.  No vomiting.  No tearing.  No numbness. In July 22, 2014 Patient is here for ongoing evaluation and continuation of chemotherapy.  Rash has improved.  No diarrhea.  No abdominal pain.  This was suppressed no chills.  No fever.  Appetite has been stable.  REVIEW OF SYSTEMS:   ROS Gen. status: Alert oriented not any acute distress. HEENT: No headache.  No dizziness.  No soreness in the mouth.  No difficulty in swallowing.  No neck pain.  No palpable masses in the neck Lungs: No cough.  No hemoptysis.  No chest pain.  No shortness of breath. Cardiac: No chest pain.  No paroxysmal dyspnea.  No orthopnea.  No palpitation Abdomen: No abdominal pain.  No diarrhea.  No nausea.  No vomiting.  Appetite is stable.  No rectal bleeding. Skin: No ecchymosis.   rash.  Improving.  No itching. Neuro: No headache.  No dizziness.  No weakness in upper or lower extremity.  No evidence  of tingling numbness.. Lower extremity no edema As per HPI. Otherwise, a complete review of systems is negatve.  PAST MEDICAL HISTORY: Past Medical History  Diagnosis Date  . Kidney stones 1975  . Lung cancer 2014    Radiation therapy  . Liver cancer   . Colon cancer 01/2014  . Prostate cancer 2005    treated by Dr Eliberto Ivory  . Brain cancer 01/2014    Chemotherapy and radiation treatment    PAST SURGICAL HISTORY: Past Surgical History  Procedure Laterality Date  . Kidney stones  1975  . Prostate surgery  2005    partial removal due to cancer  . Colon surgery  05-03-14    Resection of Sigmoid Colon and liver biopsy  . Portacath placement  2014    FAMILY HISTORY Family  History  Problem Relation Age of Onset  . Lung cancer Father   . Alzheimer's disease Mother        ADVANCED DIRECTIVES: Patient does have advanced health care directive   HEALTH MAINTENANCE: History  Substance Use Topics  . Smoking status: Former Smoker -- 35 years  . Smokeless tobacco: Never Used  . Alcohol Use: No      No Known Allergies  Current Outpatient Prescriptions  Medication Sig Dispense Refill  . doxycycline (VIBRA-TABS) 100 MG tablet Take 1 tablet (100 mg total) by mouth 2 (two) times daily. 60 tablet 3  . ibuprofen (ADVIL,MOTRIN) 200 MG tablet Take 200 mg by mouth every 6 (six) hours as needed.    . lidocaine-prilocaine (EMLA) cream Apply to affected area once 30 g 3  . oxyCODONE (OXY IR/ROXICODONE) 5 MG immediate release tablet Take 5 mg by mouth every 6 (six) hours as needed.     Marland Kitchen PROVENTIL HFA 108 (90 BASE) MCG/ACT inhaler Inhale 90 mcg into the lungs every 6 (six) hours as needed.    . Triamcinolone Acetonide (TRIAMCINOLONE 0.1 % CREAM : EUCERIN) CREA Apply 1 application topically 2 (two) times daily. 1 each 3   No current facility-administered medications for this visit.   Facility-Administered Medications Ordered in Other Visits  Medication Dose Route Frequency Provider Last Rate Last Dose  . sodium chloride 0.9 % injection 10 mL  10 mL Intracatheter PRN [name], MD   10 mL at 07/10/14 1428    OBJECTIVE: Filed Vitals:   07/22/14 0918  BP: 144/84  Pulse: 82  Temp: 97.4 F (36.3 C)     Body mass index is 19.04 kg/(m^2).    ECOG FS:1 - Symptomatic but completely ambulatory  Physical Exam  Constitutional: No distress.  HENT:  Head: Normocephalic and atraumatic.  Right Ear: External ear normal.  Left Ear: External ear normal.  Nose: Nose normal.  Mouth/Throat: Oropharynx is clear and moist.  Eyes: Conjunctivae and EOM are normal. Pupils are equal, round, and reactive to light.  Neck: Normal range of motion. Neck supple. No JVD present. No  tracheal deviation present. No thyromegaly present.  Cardiovascular: Normal rate, normal heart sounds and intact distal pulses.  Exam reveals no gallop and no friction rub.   No murmur heard. Pulmonary/Chest: No stridor. No respiratory distress. He has no wheezes. He has no rales. He exhibits no tenderness.  Abdominal: He exhibits no distension and no mass. There is no tenderness. There is no rebound and no guarding.  Musculoskeletal: He exhibits no edema or tenderness.  Neurological: He displays normal reflexes. No cranial nerve deficit. He exhibits normal muscle tone. Coordination normal.  Skin: Rash (maculopapular rash)  noted. He is not diaphoretic. No erythema.  Psychiatric: Memory, affect and judgment normal.  Nursing note and vitals reviewed.    LAB RESULTS:     Component Value Date/Time   NA 135 07/22/2014 0855   NA 136 06/10/2014 0829   K 3.5 07/22/2014 0855   K 3.6 06/10/2014 0829   CL 106 07/22/2014 0855   CL 105 06/10/2014 0829   CO2 27 07/22/2014 0855   CO2 27 06/10/2014 0829   GLUCOSE 91 07/22/2014 0855   GLUCOSE 101* 06/10/2014 0829   BUN 12 07/22/2014 0855   BUN 8 06/10/2014 0829   CREATININE 0.78 07/22/2014 0855   CREATININE 0.79 06/10/2014 0829   CALCIUM 9.1 07/22/2014 0855   CALCIUM 9.3 06/10/2014 0829   PROT 7.2 07/22/2014 0855   PROT 7.5 06/10/2014 0829   ALBUMIN 4.0 07/22/2014 0855   ALBUMIN 4.0 06/10/2014 0829   AST 24 07/22/2014 0855   AST 28 06/10/2014 0829   ALT 16* 07/22/2014 0855   ALT 14* 06/10/2014 0829   ALKPHOS 90 07/22/2014 0855   ALKPHOS 134* 06/10/2014 0829   BILITOT 0.6 07/22/2014 0855   GFRNONAA >60 07/22/2014 0855   GFRNONAA >60 06/10/2014 0829   GFRAA >60 07/22/2014 0855   GFRAA >60 06/10/2014 0829    No results found for: SPEP, UPEP  Lab Results  Component Value Date   WBC 2.7* 07/22/2014   NEUTROABS 0.9* 07/22/2014   HGB 10.9* 07/22/2014   HCT 33.0* 07/22/2014   MCV 90.3 07/22/2014   PLT 108* 07/22/2014       Chemistry      Component Value Date/Time   NA 135 07/22/2014 0855   NA 136 06/10/2014 0829   K 3.5 07/22/2014 0855   K 3.6 06/10/2014 0829   CL 106 07/22/2014 0855   CL 105 06/10/2014 0829   CO2 27 07/22/2014 0855   CO2 27 06/10/2014 0829   BUN 12 07/22/2014 0855   BUN 8 06/10/2014 0829   CREATININE 0.78 07/22/2014 0855   CREATININE 0.79 06/10/2014 0829      Component Value Date/Time   CALCIUM 9.1 07/22/2014 0855   CALCIUM 9.3 06/10/2014 0829   ALKPHOS 90 07/22/2014 0855   ALKPHOS 134* 06/10/2014 0829   AST 24 07/22/2014 0855   AST 28 06/10/2014 0829   ALT 16* 07/22/2014 0855   ALT 14* 06/10/2014 0829   BILITOT 0.6 07/22/2014 0855           STUDIES   ASSESSMENT:   stage IV undifferentiated carcinoma (small cell) of lung There is no evidence of recurrent disease.2.rectal bleeding.  Colonoscopy revealed mid sigmoid colon cancer Biopsies positive for invasive carcinoma. mets  to liver Myelosuppressive. Hold chemotherapy Repeat CBC next week and if neutrophil count is more than 1200 and platelet count is more than 75,000 patient came to initiate chemotherapy.  PLAN:   Continue chemotherapy. Rash Secondary to VECTIBIX And doxycycline Eucerin and Aristocort cream Rash is improving Continue chemotherapy Reassessment with CEA  Patient expressed understanding and was in agreement with this plan. He also understands that He can call clinic at any time with any questions, concerns, or complaints.    Carcinoma of sigmoid colon   Staging form: Colon and Rectum, AJCC 7th Edition     Clinical: Stage IVB (yT3, N1a, M1b) - Marni Griffon, MD   07/22/2014 6:58 PM

## 2014-07-23 LAB — CEA: CEA: 65.4 ng/mL — AB (ref 0.0–4.7)

## 2014-07-29 ENCOUNTER — Ambulatory Visit: Payer: BC Managed Care – PPO

## 2014-07-29 ENCOUNTER — Inpatient Hospital Stay: Payer: BC Managed Care – PPO

## 2014-07-29 VITALS — BP 119/72 | HR 74 | Resp 18

## 2014-07-29 DIAGNOSIS — C187 Malignant neoplasm of sigmoid colon: Secondary | ICD-10-CM

## 2014-07-29 DIAGNOSIS — C3411 Malignant neoplasm of upper lobe, right bronchus or lung: Secondary | ICD-10-CM | POA: Diagnosis not present

## 2014-07-29 LAB — CBC WITH DIFFERENTIAL/PLATELET
Basophils Absolute: 0 10*3/uL (ref 0–0.1)
Basophils Relative: 0 %
EOS ABS: 0.1 10*3/uL (ref 0–0.7)
Eosinophils Relative: 2 %
HCT: 32 % — ABNORMAL LOW (ref 40.0–52.0)
HEMOGLOBIN: 10.6 g/dL — AB (ref 13.0–18.0)
LYMPHS ABS: 1.3 10*3/uL (ref 1.0–3.6)
LYMPHS PCT: 36 %
MCH: 30.3 pg (ref 26.0–34.0)
MCHC: 33.2 g/dL (ref 32.0–36.0)
MCV: 91.4 fL (ref 80.0–100.0)
MONOS PCT: 20 %
Monocytes Absolute: 0.7 10*3/uL (ref 0.2–1.0)
NEUTROS ABS: 1.5 10*3/uL (ref 1.4–6.5)
Neutrophils Relative %: 42 %
PLATELETS: 132 10*3/uL — AB (ref 150–440)
RBC: 3.51 MIL/uL — ABNORMAL LOW (ref 4.40–5.90)
RDW: 20.3 % — ABNORMAL HIGH (ref 11.5–14.5)
WBC: 3.7 10*3/uL — AB (ref 3.8–10.6)

## 2014-07-29 LAB — COMPREHENSIVE METABOLIC PANEL
ALK PHOS: 89 U/L (ref 38–126)
ALT: 19 U/L (ref 17–63)
AST: 27 U/L (ref 15–41)
Albumin: 4 g/dL (ref 3.5–5.0)
Anion gap: 4 — ABNORMAL LOW (ref 5–15)
BILIRUBIN TOTAL: 0.7 mg/dL (ref 0.3–1.2)
BUN: 7 mg/dL (ref 6–20)
CHLORIDE: 106 mmol/L (ref 101–111)
CO2: 28 mmol/L (ref 22–32)
Calcium: 9.2 mg/dL (ref 8.9–10.3)
Creatinine, Ser: 0.8 mg/dL (ref 0.61–1.24)
GLUCOSE: 102 mg/dL — AB (ref 65–99)
POTASSIUM: 3.6 mmol/L (ref 3.5–5.1)
SODIUM: 138 mmol/L (ref 135–145)
Total Protein: 7 g/dL (ref 6.5–8.1)

## 2014-07-29 LAB — MAGNESIUM: Magnesium: 1.9 mg/dL (ref 1.7–2.4)

## 2014-07-29 MED ORDER — LEUCOVORIN CALCIUM INJECTION 350 MG
750.0000 mg | Freq: Once | INTRAVENOUS | Status: AC
  Administered 2014-07-29: 750 mg via INTRAVENOUS
  Filled 2014-07-29: qty 25

## 2014-07-29 MED ORDER — DEXTROSE 5 % IV SOLN
Freq: Once | INTRAVENOUS | Status: AC
  Administered 2014-07-29: 12:00:00 via INTRAVENOUS
  Filled 2014-07-29: qty 250

## 2014-07-29 MED ORDER — SODIUM CHLORIDE 0.9 % IV SOLN
Freq: Once | INTRAVENOUS | Status: AC
  Administered 2014-07-29: 11:00:00 via INTRAVENOUS
  Filled 2014-07-29: qty 4

## 2014-07-29 MED ORDER — SODIUM CHLORIDE 0.9 % IV SOLN
2000.0000 mg/m2 | INTRAVENOUS | Status: DC
  Administered 2014-07-29: 3800 mg via INTRAVENOUS
  Filled 2014-07-29: qty 76

## 2014-07-29 MED ORDER — FLUOROURACIL CHEMO INJECTION 2.5 GM/50ML
350.0000 mg/m2 | Freq: Once | INTRAVENOUS | Status: AC
  Administered 2014-07-29: 650 mg via INTRAVENOUS
  Filled 2014-07-29: qty 13

## 2014-07-29 MED ORDER — SODIUM CHLORIDE 0.9 % IV SOLN
Freq: Once | INTRAVENOUS | Status: AC
  Administered 2014-07-29: 11:00:00 via INTRAVENOUS
  Filled 2014-07-29: qty 250

## 2014-07-29 MED ORDER — OXALIPLATIN CHEMO INJECTION 100 MG/20ML
85.0000 mg/m2 | Freq: Once | INTRAVENOUS | Status: AC
  Administered 2014-07-29: 160 mg via INTRAVENOUS
  Filled 2014-07-29: qty 32

## 2014-07-29 MED ORDER — SODIUM CHLORIDE 0.9 % IV SOLN
400.0000 mg | Freq: Once | INTRAVENOUS | Status: AC
  Administered 2014-07-29: 400 mg via INTRAVENOUS
  Filled 2014-07-29: qty 20

## 2014-07-31 ENCOUNTER — Inpatient Hospital Stay: Payer: BC Managed Care – PPO

## 2014-07-31 DIAGNOSIS — C3411 Malignant neoplasm of upper lobe, right bronchus or lung: Secondary | ICD-10-CM | POA: Diagnosis not present

## 2014-07-31 DIAGNOSIS — C801 Malignant (primary) neoplasm, unspecified: Secondary | ICD-10-CM

## 2014-07-31 MED ORDER — SODIUM CHLORIDE 0.9 % IJ SOLN
10.0000 mL | INTRAMUSCULAR | Status: DC | PRN
  Administered 2014-07-31: 10 mL
  Filled 2014-07-31: qty 10

## 2014-07-31 MED ORDER — HEPARIN SOD (PORK) LOCK FLUSH 100 UNIT/ML IV SOLN
500.0000 [IU] | Freq: Once | INTRAVENOUS | Status: AC
  Administered 2014-07-31: 500 [IU] via INTRAVENOUS
  Filled 2014-07-31: qty 5

## 2014-08-20 ENCOUNTER — Encounter: Payer: Self-pay | Admitting: Oncology

## 2014-08-20 ENCOUNTER — Inpatient Hospital Stay: Payer: BC Managed Care – PPO

## 2014-08-20 ENCOUNTER — Inpatient Hospital Stay: Payer: BC Managed Care – PPO | Attending: Oncology

## 2014-08-20 ENCOUNTER — Inpatient Hospital Stay (HOSPITAL_BASED_OUTPATIENT_CLINIC_OR_DEPARTMENT_OTHER): Payer: BC Managed Care – PPO | Admitting: Oncology

## 2014-08-20 VITALS — BP 121/91 | HR 83 | Temp 94.8°F | Wt 143.3 lb

## 2014-08-20 DIAGNOSIS — Z8546 Personal history of malignant neoplasm of prostate: Secondary | ICD-10-CM | POA: Insufficient documentation

## 2014-08-20 DIAGNOSIS — Z5111 Encounter for antineoplastic chemotherapy: Secondary | ICD-10-CM | POA: Insufficient documentation

## 2014-08-20 DIAGNOSIS — C7931 Secondary malignant neoplasm of brain: Secondary | ICD-10-CM

## 2014-08-20 DIAGNOSIS — C787 Secondary malignant neoplasm of liver and intrahepatic bile duct: Secondary | ICD-10-CM | POA: Diagnosis not present

## 2014-08-20 DIAGNOSIS — Z87442 Personal history of urinary calculi: Secondary | ICD-10-CM | POA: Diagnosis not present

## 2014-08-20 DIAGNOSIS — Z923 Personal history of irradiation: Secondary | ICD-10-CM | POA: Insufficient documentation

## 2014-08-20 DIAGNOSIS — Z87891 Personal history of nicotine dependence: Secondary | ICD-10-CM | POA: Diagnosis not present

## 2014-08-20 DIAGNOSIS — C187 Malignant neoplasm of sigmoid colon: Secondary | ICD-10-CM | POA: Diagnosis not present

## 2014-08-20 DIAGNOSIS — T451X5S Adverse effect of antineoplastic and immunosuppressive drugs, sequela: Secondary | ICD-10-CM | POA: Diagnosis not present

## 2014-08-20 DIAGNOSIS — L270 Generalized skin eruption due to drugs and medicaments taken internally: Secondary | ICD-10-CM | POA: Insufficient documentation

## 2014-08-20 DIAGNOSIS — Z85118 Personal history of other malignant neoplasm of bronchus and lung: Secondary | ICD-10-CM | POA: Insufficient documentation

## 2014-08-20 DIAGNOSIS — Z79899 Other long term (current) drug therapy: Secondary | ICD-10-CM | POA: Diagnosis not present

## 2014-08-20 DIAGNOSIS — C3411 Malignant neoplasm of upper lobe, right bronchus or lung: Secondary | ICD-10-CM | POA: Diagnosis not present

## 2014-08-20 DIAGNOSIS — Z85841 Personal history of malignant neoplasm of brain: Secondary | ICD-10-CM | POA: Insufficient documentation

## 2014-08-20 DIAGNOSIS — Z801 Family history of malignant neoplasm of trachea, bronchus and lung: Secondary | ICD-10-CM

## 2014-08-20 DIAGNOSIS — D759 Disease of blood and blood-forming organs, unspecified: Secondary | ICD-10-CM | POA: Insufficient documentation

## 2014-08-20 LAB — COMPREHENSIVE METABOLIC PANEL
ALT: 16 U/L — AB (ref 17–63)
AST: 28 U/L (ref 15–41)
Albumin: 4 g/dL (ref 3.5–5.0)
Alkaline Phosphatase: 86 U/L (ref 38–126)
Anion gap: 6 (ref 5–15)
BILIRUBIN TOTAL: 0.8 mg/dL (ref 0.3–1.2)
BUN: 7 mg/dL (ref 6–20)
CALCIUM: 8.9 mg/dL (ref 8.9–10.3)
CO2: 27 mmol/L (ref 22–32)
Chloride: 100 mmol/L — ABNORMAL LOW (ref 101–111)
Creatinine, Ser: 0.82 mg/dL (ref 0.61–1.24)
GLUCOSE: 110 mg/dL — AB (ref 65–99)
Potassium: 3.9 mmol/L (ref 3.5–5.1)
SODIUM: 133 mmol/L — AB (ref 135–145)
Total Protein: 7.1 g/dL (ref 6.5–8.1)

## 2014-08-20 LAB — CBC WITH DIFFERENTIAL/PLATELET
Basophils Absolute: 0 10*3/uL (ref 0–0.1)
Basophils Relative: 1 %
Eosinophils Absolute: 0.1 10*3/uL (ref 0–0.7)
Eosinophils Relative: 3 %
HCT: 34.8 % — ABNORMAL LOW (ref 40.0–52.0)
Hemoglobin: 11.5 g/dL — ABNORMAL LOW (ref 13.0–18.0)
LYMPHS PCT: 26 %
Lymphs Abs: 0.7 10*3/uL — ABNORMAL LOW (ref 1.0–3.6)
MCH: 30.9 pg (ref 26.0–34.0)
MCHC: 32.9 g/dL (ref 32.0–36.0)
MCV: 94 fL (ref 80.0–100.0)
MONO ABS: 0.6 10*3/uL (ref 0.2–1.0)
Monocytes Relative: 21 %
NEUTROS ABS: 1.3 10*3/uL — AB (ref 1.4–6.5)
NEUTROS PCT: 49 %
PLATELETS: 118 10*3/uL — AB (ref 150–440)
RBC: 3.7 MIL/uL — ABNORMAL LOW (ref 4.40–5.90)
RDW: 22.1 % — ABNORMAL HIGH (ref 11.5–14.5)
WBC: 2.7 10*3/uL — AB (ref 3.8–10.6)

## 2014-08-20 MED ORDER — HEPARIN SOD (PORK) LOCK FLUSH 100 UNIT/ML IV SOLN
INTRAVENOUS | Status: AC
  Filled 2014-08-20: qty 5

## 2014-08-20 NOTE — Progress Notes (Signed)
[place] @ [facility] [phone]) [phone]  Fax:(336) [phone]     [name] OB: 24-Apr-1940  MR#: [id number]  LGX#:[id number]  Patient Care Team: No Pcp Per Patient as PCP - General (General Practice) [name], MD (Oncology) [name], MD (General Surgery)  CHIEF COMPLAINT:  Chief Complaint  Patient presents with  . 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 MRI scan of brain shows the right parietal lobe lesion (T4, N2, M1 disease) September, 2014 stage IV 3. Stereotactic radiation therapy to the brain been arranged.  Patient was started on carboplatinum and VP-16. 4. Chest radiation started on December, 2014 5. Patient finished radiation to the chest in January of 2015.  Received total 6000 rads 6. Finished consolidation chemotherapy on March 14, 2013 7. PET scan shows progressive disease in the liver.  September, 2015 8. Started on carboplatinum and CPT-11 from November 14, 2013 9.cancer of the mid sigmoid colon.  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 post sigmoid colectomy and liver biopsy March of 2016 T3 N0 M1 disease stage IV K-ras wild type HPI:        Carcinoma of sigmoid colon   04/24/2014 Initial Diagnosis Carcinoma of sigmoid colon    Oncology Flowsheet 06/24/2014 06/24/2014 07/08/2014 07/08/2014 07/29/2014 07/29/2014  Day, Cycle Day 1, Cycle 1   Day 1, Cycle 2   Day 1, Cycle 3    dexamethasone (DECADRON) IV [ 10 mg ]   [ 12 mg ]   [ 10 mg ]    fluorouracil (ADRUCIL) IV 350 mg/m2 2,000 mg/m2 350 mg/m2 2,000 mg/m2 350 mg/m2 2,000 mg/m2  fosaprepitant (EMEND) IV - - [ 150 mg ]   - -  leucovorin IV 400 mg/m2    750 mg   750 mg    ondansetron (ZOFRAN) IV [ 8 mg ]   - - [ 8 mg ]    oxaliplatin (ELOXATIN) IV 85 mg/m2   85 mg/m2   85 mg/m2    palonosetron (ALOXI) IV - - 0.25 mg   - -  panitumumab (VECTIBIX) IV - - 400 mg   400 mg      INTERVAL HISTORY:  Patient is here for ongoing evaluation and continuation of chemotherapy.  As developed rash on the face secondary to [place].  No chills.  No fever.  No nausea.  No vomiting.  No diarrhea.  No tingling or numbness. He  is here for the next cycle of chemotherapy.. After starting doxycycline rash is improved.  No diarrhea.  No abdominal pain.  No nausea.  No vomiting.  No tearing.  No numbness. In July 22, 2014 Patient is here for ongoing evaluation and continuation of chemotherapy.  Rash has improved.  No diarrhea.  No abdominal pain.  This was suppressed no chills.  No fever.  Appetite has been stable.. August 20, 2014 Patient is here for next cycle of chemotherapy.  No chills no fever.  Patient had a wisdom tooth removed yesterday there is a lot of inflammation and bleeding.  Which is gradually getting better.. Swelling on the left side of the face persist.  REVIEW OF SYSTEMS:   ROS Gen. status: Alert oriented not any acute distress. HEENT:  No headache.  No dizziness.  No soreness in the mouth.  No difficulty in swallowing.  No neck Wisdom tooth from the left side of the mouth was removed.  Swelling and bleeding  No palpable masses in the neck Lungs: No cough.  No hemoptysis.  No chest pain.  No shortness of breath. Cardiac: No chest pain.  No paroxysmal dyspnea.  No orthopnea.  No palpitation Abdomen: No abdominal pain.  No diarrhea.  No nausea.  No vomiting.  Appetite is stable.  No rectal bleeding. Skin: No ecchymosis.   rash.  Improving.  No itching. Neuro: No headache.  No dizziness.  No weakness in upper or lower extremity.  No evidence of tingling numbness.. Lower extremity no edema As per HPI. Otherwise, a complete review of systems is  negatve.  PAST MEDICAL HISTORY: Past Medical History  Diagnosis Date  . Kidney stones 1975  . Lung cancer 2014    Radiation therapy  . Liver cancer   . Colon cancer 01/2014  . Prostate cancer 2005    treated by Dr Eliberto Ivory  . Brain cancer 01/2014    Chemotherapy and radiation treatment    PAST SURGICAL HISTORY: Past Surgical History  Procedure Laterality Date  . Kidney stones  1975  . Prostate surgery  2005    partial removal due to cancer  . Colon surgery  05-03-14    Resection of Sigmoid Colon and liver biopsy  . Portacath placement  2014    FAMILY HISTORY Family History  Problem Relation Age of Onset  . Lung cancer Father   . Alzheimer's disease Mother        ADVANCED DIRECTIVES: Patient does have advanced health care directive   HEALTH MAINTENANCE: History  Substance Use Topics  . Smoking status: Former Smoker -- 35 years  . Smokeless tobacco: Never Used  . Alcohol Use: No      No Known Allergies  Current Outpatient Prescriptions  Medication Sig Dispense Refill  . doxycycline (VIBRA-TABS) 100 MG tablet Take 1 tablet (100 mg total) by mouth 2 (two) times daily. 60 tablet 3  . ibuprofen (ADVIL,MOTRIN) 200 MG tablet Take 200 mg by mouth every 6 (six) hours as needed.    . lidocaine-prilocaine (EMLA) cream Apply to affected area once 30 g 3  . oxyCODONE (OXY IR/ROXICODONE) 5 MG immediate release tablet Take 5 mg by mouth every 6 (six) hours as needed.     Marland Kitchen PROVENTIL HFA 108 (90 BASE) MCG/ACT inhaler Inhale 90 mcg into the lungs every 6 (six) hours as needed.    . Triamcinolone Acetonide (TRIAMCINOLONE 0.1 % CREAM : EUCERIN) CREA Apply 1 application topically 2 (two) times daily. 1 each 3   No current facility-administered medications for this visit.   Facility-Administered Medications Ordered in Other Visits  Medication Dose Route Frequency Provider Last Rate Last Dose  . sodium chloride 0.9 % injection 10 mL  10 mL Intracatheter PRN [name], MD   10  mL at 07/10/14 1428    OBJECTIVE: Filed Vitals:   08/20/14 0834  BP: 121/91  Pulse: 83  Temp: 94.8 F (34.9 C)     Body mass index is 18.39 kg/(m^2).    ECOG FS:1 - Symptomatic but completely ambulatory  Physical Exam  GENERAL:  Well developed, well nourished, sitting comfortably in the exam room in no acute distress. MENTAL STATUS:  Alert and oriented to person, place and time. HEAD:Normocephalic, atraumatic, face symmetric, no Cushingoid features. EYES:  .  Pupils equal  round and reactive to light and accomodation.  No conjunctivitis or scleral icterus. ENT:  Oropharynx clear without lesion.  Tongue normal. Mucous membranes moist. .  had wisdom tooth removed which is still bleeding or redness and swelling RESPIRATORY:  Clear to auscultation without rales, wheezes or rhonchi. CARDIOVASCULAR:  Regular rate and rhythm without murmur, rub or gallop. BREAST:  Right breast without masses, skin changes or nipple discharge.  Left breast without masses, skin changes or nipple discharge. ABDOMEN:  Soft, non-tender, with active bowel sounds, and no hepatosplenomegaly.  No masses. BACK:  No CVA tenderness.  No tenderness on percussion of the back or rib cage. SKIN:  No rashes, ulcers or lesions. EXTREMITIES: No edema, no skin discoloration or tenderness.  No palpable cords. LYMPH NODES: No palpable cervical, supraclavicular, axillary or inguinal adenopathy  NEUROLOGICAL: Unremarkable. PSYCH:  Appropriate.   LAB RESULTS:     Component Value Date/Time   NA 133* 08/20/2014 0810   NA 136 06/10/2014 0829   K 3.9 08/20/2014 0810   K 3.6 06/10/2014 0829   CL 100* 08/20/2014 0810   CL 105 06/10/2014 0829   CO2 27 08/20/2014 0810   CO2 27 06/10/2014 0829   GLUCOSE 110* 08/20/2014 0810   GLUCOSE 101* 06/10/2014 0829   BUN 7 08/20/2014 0810   BUN 8 06/10/2014 0829   CREATININE 0.82 08/20/2014 0810   CREATININE 0.79 06/10/2014 0829   CALCIUM 8.9 08/20/2014 0810   CALCIUM 9.3 06/10/2014  0829   PROT 7.1 08/20/2014 0810   PROT 7.5 06/10/2014 0829   ALBUMIN 4.0 08/20/2014 0810   ALBUMIN 4.0 06/10/2014 0829   AST 28 08/20/2014 0810   AST 28 06/10/2014 0829   ALT 16* 08/20/2014 0810   ALT 14* 06/10/2014 0829   ALKPHOS 86 08/20/2014 0810   ALKPHOS 134* 06/10/2014 0829   BILITOT 0.8 08/20/2014 0810   GFRNONAA >60 08/20/2014 0810   GFRNONAA >60 06/10/2014 0829   GFRAA >60 08/20/2014 0810   GFRAA >60 06/10/2014 0829    No results found for: SPEP, UPEP  Lab Results  Component Value Date   WBC 2.7* 08/20/2014   NEUTROABS 1.3* 08/20/2014   HGB 11.5* 08/20/2014   HCT 34.8* 08/20/2014   MCV 94.0 08/20/2014   PLT 118* 08/20/2014      Chemistry      Component Value Date/Time   NA 133* 08/20/2014 0810   NA 136 06/10/2014 0829   K 3.9 08/20/2014 0810   K 3.6 06/10/2014 0829   CL 100* 08/20/2014 0810   CL 105 06/10/2014 0829   CO2 27 08/20/2014 0810   CO2 27 06/10/2014 0829   BUN 7 08/20/2014 0810   BUN 8 06/10/2014 0829   CREATININE 0.82 08/20/2014 0810   CREATININE 0.79 06/10/2014 0829      Component Value Date/Time   CALCIUM 8.9 08/20/2014 0810   CALCIUM 9.3 06/10/2014 0829   ALKPHOS 86 08/20/2014 0810   ALKPHOS 134* 06/10/2014 0829   AST 28 08/20/2014 0810   AST 28 06/10/2014 0829   ALT 16* 08/20/2014 0810   ALT 14* 06/10/2014 0829   BILITOT 0.8 08/20/2014 0810           STUDIES   ASSESSMENT:   stage IV undifferentiated carcinoma (small cell) of lung There is no evidence of recurrent disease.2.rectal bleeding.  Colonoscopy revealed mid sigmoid colon cancer Biopsies positive for invasive carcinoma. mets  to liver Patient had wisdom tooth removed which is still bleeding  PLAN:   Hold  chemotherapy till next week  CEA slightly elevated from the previous examination will continue to monitor Rash is getting better Patient expressed understanding and was in agreement with this plan. He also understands that He can call clinic at any time  with any questions, concerns, or complaints.    Carcinoma of sigmoid colon   Staging form: Colon and Rectum, AJCC 7th Edition     Clinical: Stage IVB (yT3, N1a, M1b) - Marni Griffon, MD   08/20/2014 10:11 AM

## 2014-08-20 NOTE — Progress Notes (Signed)
Patient does not have living will.  Former smoker.  Had a tooth pulled yesterday.  Bled all night.  Bleeding has stopped this morning.

## 2014-08-21 LAB — CEA: CEA: 26.3 ng/mL — AB (ref 0.0–4.7)

## 2014-08-27 ENCOUNTER — Inpatient Hospital Stay: Payer: BC Managed Care – PPO

## 2014-08-27 DIAGNOSIS — C187 Malignant neoplasm of sigmoid colon: Secondary | ICD-10-CM | POA: Diagnosis not present

## 2014-08-27 LAB — COMPREHENSIVE METABOLIC PANEL
ALT: 18 U/L (ref 17–63)
AST: 27 U/L (ref 15–41)
Albumin: 3.8 g/dL (ref 3.5–5.0)
Alkaline Phosphatase: 83 U/L (ref 38–126)
Anion gap: 4 — ABNORMAL LOW (ref 5–15)
BUN: 13 mg/dL (ref 6–20)
CALCIUM: 8.5 mg/dL — AB (ref 8.9–10.3)
CO2: 27 mmol/L (ref 22–32)
Chloride: 106 mmol/L (ref 101–111)
Creatinine, Ser: 0.78 mg/dL (ref 0.61–1.24)
GFR calc Af Amer: >60 mL/min (ref >60)
GFR calc non Af Amer: >60 mL/min (ref >60)
Glucose, Bld: 91 mg/dL (ref 65–99)
Potassium: 3.6 mmol/L (ref 3.5–5.1)
Sodium: 137 mmol/L (ref 135–145)
TOTAL PROTEIN: 6.6 g/dL (ref 6.5–8.1)
Total Bilirubin: 0.5 mg/dL (ref 0.3–1.2)

## 2014-08-27 LAB — CBC WITH DIFFERENTIAL/PLATELET
BASOS ABS: 0 10*3/uL (ref 0–0.1)
Basophils Relative: 0 %
Eosinophils Absolute: 0.1 10*3/uL (ref 0–0.7)
Eosinophils Relative: 2 %
HEMATOCRIT: 31.1 % — AB (ref 40.0–52.0)
Hemoglobin: 10.3 g/dL — ABNORMAL LOW (ref 13.0–18.0)
LYMPHS PCT: 24 %
Lymphs Abs: 1 10*3/uL (ref 1.0–3.6)
MCH: 31 pg (ref 26.0–34.0)
MCHC: 33.2 g/dL (ref 32.0–36.0)
MCV: 93.2 fL (ref 80.0–100.0)
Monocytes Absolute: 0.6 10*3/uL (ref 0.2–1.0)
Monocytes Relative: 14 %
NEUTROS PCT: 60 %
Neutro Abs: 2.6 10*3/uL (ref 1.4–6.5)
PLATELETS: 103 10*3/uL — AB (ref 150–440)
RBC: 3.34 MIL/uL — ABNORMAL LOW (ref 4.40–5.90)
RDW: 21 % — ABNORMAL HIGH (ref 11.5–14.5)
WBC: 4.3 10*3/uL (ref 3.8–10.6)

## 2014-08-27 MED ORDER — HEPARIN SOD (PORK) LOCK FLUSH 100 UNIT/ML IV SOLN: 500.0000 [IU] | Freq: Once | INTRAVENOUS | Status: DC | PRN

## 2014-08-27 MED ORDER — DEXTROSE 5 % IV SOLN
Freq: Once | INTRAVENOUS | Status: AC
  Administered 2014-08-27: 12:00:00 via INTRAVENOUS
  Filled 2014-08-27: qty 1000

## 2014-08-27 MED ORDER — SODIUM CHLORIDE 0.9 % IV SOLN
2000.0000 mg/m2 | INTRAVENOUS | Status: DC
  Administered 2014-08-27: 3800 mg via INTRAVENOUS
  Filled 2014-08-27: qty 76

## 2014-08-27 MED ORDER — SODIUM CHLORIDE 0.9 % IJ SOLN
10.0000 mL | INTRAMUSCULAR | Status: DC | PRN
  Filled 2014-08-27: qty 10

## 2014-08-27 MED ORDER — FLUOROURACIL CHEMO INJECTION 2.5 GM/50ML
350.0000 mg/m2 | Freq: Once | INTRAVENOUS | Status: AC
  Administered 2014-08-27: 650 mg via INTRAVENOUS
  Filled 2014-08-27: qty 13

## 2014-08-27 MED ORDER — OXALIPLATIN CHEMO INJECTION 100 MG/20ML
85.0000 mg/m2 | Freq: Once | INTRAVENOUS | Status: AC
  Administered 2014-08-27: 160 mg via INTRAVENOUS
  Filled 2014-08-27: qty 32

## 2014-08-27 MED ORDER — SODIUM CHLORIDE 0.9 % IV SOLN
400.0000 mg | Freq: Once | INTRAVENOUS | Status: AC
  Administered 2014-08-27: 400 mg via INTRAVENOUS
  Filled 2014-08-27: qty 20

## 2014-08-27 MED ORDER — SODIUM CHLORIDE 0.9 % IV SOLN
Freq: Once | INTRAVENOUS | Status: AC
  Administered 2014-08-27: 10:00:00 via INTRAVENOUS
  Filled 2014-08-27: qty 1000

## 2014-08-27 MED ORDER — DEXTROSE 5 % IV SOLN
750.0000 mg | Freq: Once | INTRAVENOUS | Status: AC
  Administered 2014-08-27: 750 mg via INTRAVENOUS
  Filled 2014-08-27: qty 25

## 2014-08-27 MED ORDER — SODIUM CHLORIDE 0.9 % IV SOLN
Freq: Once | INTRAVENOUS | Status: AC
  Administered 2014-08-27: 10:00:00 via INTRAVENOUS
  Filled 2014-08-27: qty 4

## 2014-08-29 ENCOUNTER — Inpatient Hospital Stay: Payer: BC Managed Care – PPO

## 2014-08-29 VITALS — BP 132/82 | HR 88 | Temp 95.8°F | Resp 18

## 2014-08-29 DIAGNOSIS — C187 Malignant neoplasm of sigmoid colon: Secondary | ICD-10-CM | POA: Diagnosis not present

## 2014-08-29 MED ORDER — HEPARIN SOD (PORK) LOCK FLUSH 100 UNIT/ML IV SOLN
500.0000 [IU] | Freq: Once | INTRAVENOUS | Status: AC | PRN
  Administered 2014-08-29: 500 [IU]

## 2014-08-29 MED ORDER — SODIUM CHLORIDE 0.9 % IJ SOLN
10.0000 mL | INTRAMUSCULAR | Status: DC | PRN
  Administered 2014-08-29: 10 mL
  Filled 2014-08-29: qty 10

## 2014-09-06 ENCOUNTER — Other Ambulatory Visit: Payer: Self-pay | Admitting: *Deleted

## 2014-09-06 DIAGNOSIS — C187 Malignant neoplasm of sigmoid colon: Secondary | ICD-10-CM

## 2014-09-10 ENCOUNTER — Inpatient Hospital Stay: Payer: BC Managed Care – PPO

## 2014-09-10 ENCOUNTER — Encounter: Payer: Self-pay | Admitting: Oncology

## 2014-09-10 ENCOUNTER — Inpatient Hospital Stay (HOSPITAL_BASED_OUTPATIENT_CLINIC_OR_DEPARTMENT_OTHER): Payer: BC Managed Care – PPO | Admitting: Oncology

## 2014-09-10 VITALS — BP 126/84 | HR 80 | Temp 94.7°F | Wt 141.5 lb

## 2014-09-10 DIAGNOSIS — C187 Malignant neoplasm of sigmoid colon: Secondary | ICD-10-CM

## 2014-09-10 DIAGNOSIS — T451X5S Adverse effect of antineoplastic and immunosuppressive drugs, sequela: Secondary | ICD-10-CM

## 2014-09-10 DIAGNOSIS — C3490 Malignant neoplasm of unspecified part of unspecified bronchus or lung: Secondary | ICD-10-CM

## 2014-09-10 DIAGNOSIS — Z8546 Personal history of malignant neoplasm of prostate: Secondary | ICD-10-CM

## 2014-09-10 DIAGNOSIS — Z85118 Personal history of other malignant neoplasm of bronchus and lung: Secondary | ICD-10-CM

## 2014-09-10 DIAGNOSIS — Z85841 Personal history of malignant neoplasm of brain: Secondary | ICD-10-CM

## 2014-09-10 DIAGNOSIS — D759 Disease of blood and blood-forming organs, unspecified: Secondary | ICD-10-CM

## 2014-09-10 DIAGNOSIS — L270 Generalized skin eruption due to drugs and medicaments taken internally: Secondary | ICD-10-CM

## 2014-09-10 DIAGNOSIS — C787 Secondary malignant neoplasm of liver and intrahepatic bile duct: Secondary | ICD-10-CM | POA: Diagnosis not present

## 2014-09-10 DIAGNOSIS — Z87442 Personal history of urinary calculi: Secondary | ICD-10-CM

## 2014-09-10 DIAGNOSIS — Z87891 Personal history of nicotine dependence: Secondary | ICD-10-CM

## 2014-09-10 DIAGNOSIS — Z79899 Other long term (current) drug therapy: Secondary | ICD-10-CM

## 2014-09-10 DIAGNOSIS — Z923 Personal history of irradiation: Secondary | ICD-10-CM

## 2014-09-10 LAB — CBC WITH DIFFERENTIAL/PLATELET
Basophils Absolute: 0 10*3/uL (ref 0–0.1)
Basophils Relative: 0 %
Eosinophils Absolute: 0.2 10*3/uL (ref 0–0.7)
Eosinophils Relative: 8 %
HEMATOCRIT: 31.3 % — AB (ref 40.0–52.0)
HEMOGLOBIN: 10.4 g/dL — AB (ref 13.0–18.0)
LYMPHS PCT: 44 %
Lymphs Abs: 1.3 10*3/uL (ref 1.0–3.6)
MCH: 31.3 pg (ref 26.0–34.0)
MCHC: 33.3 g/dL (ref 32.0–36.0)
MCV: 94.1 fL (ref 80.0–100.0)
Monocytes Absolute: 0.4 10*3/uL (ref 0.2–1.0)
Monocytes Relative: 12 %
NEUTROS PCT: 36 %
Neutro Abs: 1.1 10*3/uL — ABNORMAL LOW (ref 1.4–6.5)
Platelets: 61 10*3/uL — ABNORMAL LOW (ref 150–440)
RBC: 3.33 MIL/uL — ABNORMAL LOW (ref 4.40–5.90)
RDW: 19.8 % — AB (ref 11.5–14.5)
WBC: 3 10*3/uL — ABNORMAL LOW (ref 3.8–10.6)

## 2014-09-10 LAB — COMPREHENSIVE METABOLIC PANEL
ALK PHOS: 84 U/L (ref 38–126)
ALT: 24 U/L (ref 17–63)
AST: 30 U/L (ref 15–41)
Albumin: 3.8 g/dL (ref 3.5–5.0)
Anion gap: 7 (ref 5–15)
BILIRUBIN TOTAL: 0.5 mg/dL (ref 0.3–1.2)
BUN: 9 mg/dL (ref 6–20)
CHLORIDE: 106 mmol/L (ref 101–111)
CO2: 26 mmol/L (ref 22–32)
Calcium: 8.6 mg/dL — ABNORMAL LOW (ref 8.9–10.3)
Creatinine, Ser: 0.7 mg/dL (ref 0.61–1.24)
GFR calc Af Amer: >60 mL/min (ref >60)
GFR calc non Af Amer: >60 mL/min (ref >60)
GLUCOSE: 91 mg/dL (ref 65–99)
Potassium: 3.8 mmol/L (ref 3.5–5.1)
SODIUM: 139 mmol/L (ref 135–145)
TOTAL PROTEIN: 6.5 g/dL (ref 6.5–8.1)

## 2014-09-10 MED ORDER — HEPARIN SOD (PORK) LOCK FLUSH 100 UNIT/ML IV SOLN
500.0000 [IU] | Freq: Once | INTRAVENOUS | Status: AC
  Administered 2014-09-10: 500 [IU] via INTRAVENOUS
  Filled 2014-09-10: qty 5

## 2014-09-10 MED ORDER — PREDNISONE 10 MG PO TABS: 10.0000 mg | ORAL_TABLET | Freq: Every day | ORAL | Status: DC

## 2014-09-10 MED ORDER — SODIUM CHLORIDE 0.9 % IJ SOLN
10.0000 mL | INTRAMUSCULAR | Status: DC | PRN
  Administered 2014-09-10: 10 mL via INTRAVENOUS
  Filled 2014-09-10: qty 10

## 2014-09-10 NOTE — Progress Notes (Signed)
Patient does not have living will.  Former smoker.  Patient states after last infusion he had diarrhea and SOB for 4-5 days.

## 2014-09-11 LAB — CEA: CEA: 14.6 ng/mL — AB (ref 0.0–4.7)

## 2014-09-12 ENCOUNTER — Inpatient Hospital Stay: Payer: BC Managed Care – PPO

## 2014-09-14 NOTE — Progress Notes (Signed)
[place] @ [facility] Telephone:(336) [phone]  Fax:(336) [phone]     [name] OB: [birth year]  MR#: [id number]  UUV#:[id number]  Patient Care Team: No Pcp Per Patient as PCP - General (General Practice) [name], MD (Oncology) [name], MD (General Surgery)  CHIEF COMPLAINT:  Chief Complaint  Patient presents with  . 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 MRI scan of brain shows the right parietal lobe lesion (T4, N2, M1 disease) September, 2014 stage IV 3. Stereotactic radiation therapy to the brain been arranged.  Patient was started on carboplatinum and VP-16. 4. Chest radiation started on December, 2014 5. Patient finished radiation to the chest in January of 2015.  Received total 6000 rads 6. Finished consolidation chemotherapy on March 14, 2013 7. PET scan shows progressive disease in the liver.  September, 2015 8. Started on carboplatinum and CPT-11 from November 14, 2013 9.cancer of the mid sigmoid colon.  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 post sigmoid colectomy and liver biopsy March of 2016 T3 N0 M1 disease stage IV K-ras wild type HPI:        Carcinoma of sigmoid colon   04/24/2014 Initial Diagnosis Carcinoma of sigmoid colon    Oncology Flowsheet 06/24/2014 07/08/2014 07/08/2014 07/29/2014 07/29/2014 08/27/2014 08/27/2014  Day, Cycle   Day 1, Cycle 2   Day 1, Cycle 3   Day 1, Cycle 4    dexamethasone (DECADRON) IV   [ 12 mg ]   [ 10 mg ]   [ 10 mg ]    fluorouracil (ADRUCIL) IV 2,000 mg/m2 350 mg/m2 2,000 mg/m2 350 mg/m2 2,000 mg/m2 350 mg/m2 2,000 mg/m2  fosaprepitant (EMEND) IV - [ 150 mg ]   - - - -    leucovorin IV   750 mg   750 mg   750 mg    ondansetron (ZOFRAN) IV   - - [ 8 mg ]   [ 8 mg ]    oxaliplatin (ELOXATIN) IV   85 mg/m2   85 mg/m2   85 mg/m2    palonosetron (ALOXI) IV - 0.25 mg   - - - -  panitumumab (VECTIBIX) IV - 400 mg   400 mg   400 mg      INTERVAL HISTORY:  Patient is here for ongoing evaluation and continuation of chemotherapy.  As developed rash on the face secondary to [place].  No chills.  No fever.  No nausea.  No vomiting.  No diarrhea.  No tingling or numbness. He  is here for the next cycle of chemotherapy.. After starting doxycycline rash is improved.  No diarrhea.  No abdominal pain.  No nausea.  No vomiting.  No tearing.  No numbness. In July 22, 2014 Patient is here for ongoing evaluation and continuation of chemotherapy.  Rash has improved.  No diarrhea.  No abdominal pain.  This was suppressed no chills.  No fever.  Appetite has been stable.. August 20, 2014 Patient is here for next cycle of chemotherapy.  No chills no fever.  Patient had a wisdom tooth removed yesterday there is a lot of inflammation and bleeding.  Which is gradually getting better.. Swelling on the left side of  the face persist. September 10, 2014 Patient is here for next of chemotherapy.  No abdominal pain.  No chills.  No fever.  No tingling.  No numbness.  Appetite has been stable.  Patient is somewhat myelosuppressive breast.  But afebrile  REVIEW OF SYSTEMS:   ROS Gen. status: Alert oriented not any acute distress. HEENT: No headache.  No dizziness.  No soreness in the mouth.  No difficulty in swallowing.  No neck Wisdom tooth from the left side of the mouth was removed.  Swelling and bleeding  No palpable masses in the neck Lungs: No cough.  No hemoptysis.  No chest pain.  No shortness of breath. Cardiac: No chest pain.  No paroxysmal dyspnea.  No orthopnea.  No palpitation Abdomen: No abdominal pain.  No diarrhea.  No nausea.  No vomiting.  Appetite is stable.  No rectal  bleeding. Skin: No ecchymosis.   rash.  Improving.  No itching. Neuro: No headache.  No dizziness.  No weakness in upper or lower extremity.  No evidence of tingling numbness.. Lower extremity no edema As per HPI. Otherwise, a complete review of systems is negatve.  PAST MEDICAL HISTORY: Past Medical History  Diagnosis Date  . Kidney stones 1975  . Lung cancer 2014    Radiation therapy  . Liver cancer   . Colon cancer 01/2014  . Prostate cancer 2005    treated by Dr Eliberto Ivory  . Brain cancer 01/2014    Chemotherapy and radiation treatment    PAST SURGICAL HISTORY: Past Surgical History  Procedure Laterality Date  . Kidney stones  1975  . Prostate surgery  2005    partial removal due to cancer  . Colon surgery  05-03-14    Resection of Sigmoid Colon and liver biopsy  . Portacath placement  2014    FAMILY HISTORY Family History  Problem Relation Age of Onset  . Lung cancer Father   . Alzheimer's disease Mother        ADVANCED DIRECTIVES: Patient does have advanced health care directive   HEALTH MAINTENANCE: History  Substance Use Topics  . Smoking status: Former Smoker -- 35 years  . Smokeless tobacco: Never Used  . Alcohol Use: No      No Known Allergies  Current Outpatient Prescriptions  Medication Sig Dispense Refill  . doxycycline (VIBRA-TABS) 100 MG tablet Take 1 tablet (100 mg total) by mouth 2 (two) times daily. 60 tablet 3  . ibuprofen (ADVIL,MOTRIN) 200 MG tablet Take 200 mg by mouth every 6 (six) hours as needed.    . lidocaine-prilocaine (EMLA) cream Apply to affected area once 30 g 3  . oxyCODONE (OXY IR/ROXICODONE) 5 MG immediate release tablet Take 5 mg by mouth every 6 (six) hours as needed.     Marland Kitchen PROVENTIL HFA 108 (90 BASE) MCG/ACT inhaler Inhale 90 mcg into the lungs every 6 (six) hours as needed.    . Triamcinolone Acetonide (TRIAMCINOLONE 0.1 % CREAM : EUCERIN) CREA Apply 1 application topically 2 (two) times daily. 1 each 3  . predniSONE  (DELTASONE) 10 MG tablet Take 1 tablet (10 mg total) by mouth daily with breakfast. 30 tablet 0   No current facility-administered medications for this visit.   Facility-Administered Medications Ordered in Other Visits  Medication Dose Route Frequency Provider Last Rate Last Dose  . sodium chloride 0.9 % injection 10 mL  10 mL Intracatheter PRN [name], MD   10 mL at 07/10/14 1428    OBJECTIVE: Filed Vitals:  09/10/14 1000  BP: 126/84  Pulse: 80  Temp: 94.7 F (34.8 C)     Body mass index is 18.16 kg/(m^2).    ECOG FS:1 - Symptomatic but completely ambulatory  Physical Exam  GENERAL:  Well developed, well nourished, sitting comfortably in the exam room in no acute distress. MENTAL STATUS:  Alert and oriented to person, place and time. HEAD:Normocephalic, atraumatic, face symmetric, no Cushingoid features. EYES:  .  Pupils equal round and reactive to light and accomodation.  No conjunctivitis or scleral icterus. ENT:  Oropharynx clear without lesion.  Tongue normal. Mucous membranes moist. .  had wisdom tooth removed which is still bleeding or redness and swelling RESPIRATORY:  Clear to auscultation without rales, wheezes or rhonchi. CARDIOVASCULAR:  Regular rate and rhythm without murmur, rub or gallop. BREAST:  Right breast without masses, skin changes or nipple discharge.  Left breast without masses, skin changes or nipple discharge. ABDOMEN:  Soft, non-tender, with active bowel sounds, and no hepatosplenomegaly.  No masses. BACK:  No CVA tenderness.  No tenderness on percussion of the back or rib cage. SKIN:  No rashes, ulcers or lesions. EXTREMITIES: No edema, no skin discoloration or tenderness.  No palpable cords. LYMPH NODES: No palpable cervical, supraclavicular, axillary or inguinal adenopathy  NEUROLOGICAL: Unremarkable. PSYCH:  Appropriate.   LAB RESULTS:     Component Value Date/Time   NA 139 09/10/2014 0930   NA 136 06/10/2014 0829   K 3.8 09/10/2014 0930    K 3.6 06/10/2014 0829   CL 106 09/10/2014 0930   CL 105 06/10/2014 0829   CO2 26 09/10/2014 0930   CO2 27 06/10/2014 0829   GLUCOSE 91 09/10/2014 0930   GLUCOSE 101* 06/10/2014 0829   BUN 9 09/10/2014 0930   BUN 8 06/10/2014 0829   CREATININE 0.70 09/10/2014 0930   CREATININE 0.79 06/10/2014 0829   CALCIUM 8.6* 09/10/2014 0930   CALCIUM 9.3 06/10/2014 0829   PROT 6.5 09/10/2014 0930   PROT 7.5 06/10/2014 0829   ALBUMIN 3.8 09/10/2014 0930   ALBUMIN 4.0 06/10/2014 0829   AST 30 09/10/2014 0930   AST 28 06/10/2014 0829   ALT 24 09/10/2014 0930   ALT 14* 06/10/2014 0829   ALKPHOS 84 09/10/2014 0930   ALKPHOS 134* 06/10/2014 0829   BILITOT 0.5 09/10/2014 0930   BILITOT 0.6 06/10/2014 0829   GFRNONAA >60 09/10/2014 0930   GFRNONAA >60 06/10/2014 0829   GFRAA >60 09/10/2014 0930   GFRAA >60 06/10/2014 0829    No results found for: SPEP, UPEP  Lab Results  Component Value Date   WBC 3.0* 09/10/2014   NEUTROABS 1.1* 09/10/2014   HGB 10.4* 09/10/2014   HCT 31.3* 09/10/2014   MCV 94.1 09/10/2014   PLT 61* 09/10/2014      Chemistry      Component Value Date/Time   NA 139 09/10/2014 0930   NA 136 06/10/2014 0829   K 3.8 09/10/2014 0930   K 3.6 06/10/2014 0829   CL 106 09/10/2014 0930   CL 105 06/10/2014 0829   CO2 26 09/10/2014 0930   CO2 27 06/10/2014 0829   BUN 9 09/10/2014 0930   BUN 8 06/10/2014 0829   CREATININE 0.70 09/10/2014 0930   CREATININE 0.79 06/10/2014 0829      Component Value Date/Time   CALCIUM 8.6* 09/10/2014 0930   CALCIUM 9.3 06/10/2014 0829   ALKPHOS 84 09/10/2014 0930   ALKPHOS 134* 06/10/2014 0829   AST 30 09/10/2014 0930  AST 28 06/10/2014 0829   ALT 24 09/10/2014 0930   ALT 14* 06/10/2014 0829   BILITOT 0.5 09/10/2014 0930   BILITOT 0.6 06/10/2014 0829           STUDIES Component     Latest Ref Rng 05/20/2014 05/23/2014 07/22/2014 08/20/2014 09/10/2014  CEA     0.0 - 4.7 ng/mL 775.3 (H) 603.3 (H) 65.4 (H) 26.3 (H) 14.6 (H)     ASSESSMENT:   stage IV undifferentiated carcinoma (small cell) of lung There is no evidence of recurrent disease.2.rectal bleeding.  Colonoscopy revealed mid sigmoid colon cancer 2.  Carcinoma of colon metastases to liver stage IV disease k-ras wild type   PLAN:   Patient  is myelosuppressed secondary to chemotherapy. Was advised to call me if spikes fever more than 100.  Or any chills or fever.  Hold chemotherapy   Carcinoma of sigmoid colon   Staging form: Colon and Rectum, AJCC 7th Edition     Clinical: Stage IVB (yT3, N1a, M1b) - Unsigned   [name], MD   09/14/2014 2:31 PM

## 2014-09-17 ENCOUNTER — Other Ambulatory Visit: Payer: Self-pay | Admitting: *Deleted

## 2014-09-17 ENCOUNTER — Inpatient Hospital Stay: Payer: Medicare Other

## 2014-09-17 ENCOUNTER — Inpatient Hospital Stay: Payer: Medicare Other | Attending: Oncology

## 2014-09-17 DIAGNOSIS — Z8546 Personal history of malignant neoplasm of prostate: Secondary | ICD-10-CM | POA: Insufficient documentation

## 2014-09-17 DIAGNOSIS — Z85841 Personal history of malignant neoplasm of brain: Secondary | ICD-10-CM | POA: Diagnosis not present

## 2014-09-17 DIAGNOSIS — Z79899 Other long term (current) drug therapy: Secondary | ICD-10-CM | POA: Insufficient documentation

## 2014-09-17 DIAGNOSIS — Z87891 Personal history of nicotine dependence: Secondary | ICD-10-CM | POA: Diagnosis not present

## 2014-09-17 DIAGNOSIS — Z5111 Encounter for antineoplastic chemotherapy: Secondary | ICD-10-CM | POA: Insufficient documentation

## 2014-09-17 DIAGNOSIS — R070 Pain in throat: Secondary | ICD-10-CM | POA: Diagnosis not present

## 2014-09-17 DIAGNOSIS — R21 Rash and other nonspecific skin eruption: Secondary | ICD-10-CM | POA: Diagnosis not present

## 2014-09-17 DIAGNOSIS — C801 Malignant (primary) neoplasm, unspecified: Secondary | ICD-10-CM

## 2014-09-17 DIAGNOSIS — C787 Secondary malignant neoplasm of liver and intrahepatic bile duct: Secondary | ICD-10-CM | POA: Diagnosis not present

## 2014-09-17 DIAGNOSIS — C187 Malignant neoplasm of sigmoid colon: Secondary | ICD-10-CM

## 2014-09-17 DIAGNOSIS — Z7952 Long term (current) use of systemic steroids: Secondary | ICD-10-CM | POA: Insufficient documentation

## 2014-09-17 DIAGNOSIS — Z85118 Personal history of other malignant neoplasm of bronchus and lung: Secondary | ICD-10-CM | POA: Diagnosis not present

## 2014-09-17 LAB — COMPREHENSIVE METABOLIC PANEL
ALT: 23 U/L (ref 17–63)
ANION GAP: 5 (ref 5–15)
AST: 30 U/L (ref 15–41)
Albumin: 3.8 g/dL (ref 3.5–5.0)
Alkaline Phosphatase: 86 U/L (ref 38–126)
BUN: 10 mg/dL (ref 6–20)
CHLORIDE: 104 mmol/L (ref 101–111)
CO2: 27 mmol/L (ref 22–32)
Calcium: 8.4 mg/dL — ABNORMAL LOW (ref 8.9–10.3)
Creatinine, Ser: 0.82 mg/dL (ref 0.61–1.24)
GFR calc Af Amer: >60 mL/min (ref >60)
GFR calc non Af Amer: >60 mL/min (ref >60)
GLUCOSE: 123 mg/dL — AB (ref 65–99)
POTASSIUM: 3.8 mmol/L (ref 3.5–5.1)
Sodium: 136 mmol/L (ref 135–145)
Total Bilirubin: 0.4 mg/dL (ref 0.3–1.2)
Total Protein: 6.8 g/dL (ref 6.5–8.1)

## 2014-09-17 LAB — CBC WITH DIFFERENTIAL/PLATELET
BASOS PCT: 0 %
Basophils Absolute: 0 10*3/uL (ref 0–0.1)
EOS PCT: 5 %
Eosinophils Absolute: 0.2 10*3/uL (ref 0–0.7)
HCT: 30.5 % — ABNORMAL LOW (ref 40.0–52.0)
Hemoglobin: 10.4 g/dL — ABNORMAL LOW (ref 13.0–18.0)
LYMPHS ABS: 1.3 10*3/uL (ref 1.0–3.6)
Lymphocytes Relative: 45 %
MCH: 31.9 pg (ref 26.0–34.0)
MCHC: 34 g/dL (ref 32.0–36.0)
MCV: 93.9 fL (ref 80.0–100.0)
MONO ABS: 0.6 10*3/uL (ref 0.2–1.0)
Monocytes Relative: 21 %
Neutro Abs: 0.8 10*3/uL — ABNORMAL LOW (ref 1.4–6.5)
Neutrophils Relative %: 29 %
Platelets: 110 10*3/uL — ABNORMAL LOW (ref 150–440)
RBC: 3.25 MIL/uL — ABNORMAL LOW (ref 4.40–5.90)
RDW: 20 % — ABNORMAL HIGH (ref 11.5–14.5)
WBC: 2.8 10*3/uL — ABNORMAL LOW (ref 3.8–10.6)

## 2014-09-17 MED ORDER — HEPARIN SOD (PORK) LOCK FLUSH 100 UNIT/ML IV SOLN
INTRAVENOUS | Status: AC
  Filled 2014-09-17: qty 5

## 2014-09-17 MED ORDER — HEPARIN SOD (PORK) LOCK FLUSH 100 UNIT/ML IV SOLN
500.0000 [IU] | Freq: Once | INTRAVENOUS | Status: AC
  Administered 2014-09-17: 500 [IU] via INTRAVENOUS

## 2014-09-24 ENCOUNTER — Inpatient Hospital Stay: Payer: Medicare Other

## 2014-09-24 ENCOUNTER — Inpatient Hospital Stay (HOSPITAL_BASED_OUTPATIENT_CLINIC_OR_DEPARTMENT_OTHER): Payer: Medicare Other | Admitting: Oncology

## 2014-09-24 ENCOUNTER — Encounter: Payer: Self-pay | Admitting: Oncology

## 2014-09-24 VITALS — BP 127/80 | HR 63 | Temp 94.7°F | Wt 148.0 lb

## 2014-09-24 DIAGNOSIS — Z85118 Personal history of other malignant neoplasm of bronchus and lung: Secondary | ICD-10-CM | POA: Diagnosis not present

## 2014-09-24 DIAGNOSIS — Z79899 Other long term (current) drug therapy: Secondary | ICD-10-CM

## 2014-09-24 DIAGNOSIS — Z7952 Long term (current) use of systemic steroids: Secondary | ICD-10-CM

## 2014-09-24 DIAGNOSIS — C187 Malignant neoplasm of sigmoid colon: Secondary | ICD-10-CM

## 2014-09-24 DIAGNOSIS — Z8546 Personal history of malignant neoplasm of prostate: Secondary | ICD-10-CM | POA: Diagnosis not present

## 2014-09-24 DIAGNOSIS — Z5111 Encounter for antineoplastic chemotherapy: Secondary | ICD-10-CM | POA: Diagnosis not present

## 2014-09-24 DIAGNOSIS — C787 Secondary malignant neoplasm of liver and intrahepatic bile duct: Secondary | ICD-10-CM

## 2014-09-24 DIAGNOSIS — Z85841 Personal history of malignant neoplasm of brain: Secondary | ICD-10-CM

## 2014-09-24 DIAGNOSIS — Z87891 Personal history of nicotine dependence: Secondary | ICD-10-CM

## 2014-09-24 LAB — CBC WITH DIFFERENTIAL/PLATELET
BASOS PCT: 0 %
Basophils Absolute: 0 10*3/uL (ref 0–0.1)
EOS ABS: 0.2 10*3/uL (ref 0–0.7)
Eosinophils Relative: 5 %
HEMATOCRIT: 30.4 % — AB (ref 40.0–52.0)
Hemoglobin: 10.1 g/dL — ABNORMAL LOW (ref 13.0–18.0)
LYMPHS PCT: 27 %
Lymphs Abs: 1.1 10*3/uL (ref 1.0–3.6)
MCH: 31.7 pg (ref 26.0–34.0)
MCHC: 33.2 g/dL (ref 32.0–36.0)
MCV: 95.5 fL (ref 80.0–100.0)
Monocytes Absolute: 0.6 10*3/uL (ref 0.2–1.0)
Monocytes Relative: 14 %
Neutro Abs: 2.2 10*3/uL (ref 1.4–6.5)
Neutrophils Relative %: 54 %
Platelets: 133 10*3/uL — ABNORMAL LOW (ref 150–440)
RBC: 3.19 MIL/uL — ABNORMAL LOW (ref 4.40–5.90)
RDW: 20 % — ABNORMAL HIGH (ref 11.5–14.5)
WBC: 4.1 10*3/uL (ref 3.8–10.6)

## 2014-09-24 LAB — COMPREHENSIVE METABOLIC PANEL
ALBUMIN: 3.7 g/dL (ref 3.5–5.0)
ALT: 17 U/L (ref 17–63)
ANION GAP: 6 (ref 5–15)
AST: 24 U/L (ref 15–41)
Alkaline Phosphatase: 82 U/L (ref 38–126)
BILIRUBIN TOTAL: 0.4 mg/dL (ref 0.3–1.2)
BUN: 10 mg/dL (ref 6–20)
CALCIUM: 9.1 mg/dL (ref 8.9–10.3)
CO2: 27 mmol/L (ref 22–32)
CREATININE: 0.8 mg/dL (ref 0.61–1.24)
Chloride: 108 mmol/L (ref 101–111)
Glucose, Bld: 88 mg/dL (ref 65–99)
Potassium: 4.1 mmol/L (ref 3.5–5.1)
Sodium: 141 mmol/L (ref 135–145)
Total Protein: 6.8 g/dL (ref 6.5–8.1)

## 2014-09-24 MED ORDER — OXALIPLATIN CHEMO INJECTION 100 MG/20ML
85.0000 mg/m2 | Freq: Once | INTRAVENOUS | Status: AC
  Administered 2014-09-24: 160 mg via INTRAVENOUS
  Filled 2014-09-24: qty 32

## 2014-09-24 MED ORDER — FLUOROURACIL CHEMO INJECTION 2.5 GM/50ML
350.0000 mg/m2 | Freq: Once | INTRAVENOUS | Status: AC
  Administered 2014-09-24: 650 mg via INTRAVENOUS
  Filled 2014-09-24: qty 13

## 2014-09-24 MED ORDER — SODIUM CHLORIDE 0.9 % IV SOLN
Freq: Once | INTRAVENOUS | Status: AC
  Administered 2014-09-24: 11:00:00 via INTRAVENOUS
  Filled 2014-09-24: qty 1000

## 2014-09-24 MED ORDER — SODIUM CHLORIDE 0.9 % IV SOLN
Freq: Once | INTRAVENOUS | Status: AC
  Administered 2014-09-24: 11:00:00 via INTRAVENOUS
  Filled 2014-09-24: qty 4

## 2014-09-24 MED ORDER — FLUOROURACIL CHEMO INJECTION 5 GM/100ML
2000.0000 mg/m2 | INTRAVENOUS | Status: DC
  Administered 2014-09-24: 3800 mg via INTRAVENOUS
  Filled 2014-09-24: qty 76

## 2014-09-24 MED ORDER — LEUCOVORIN CALCIUM INJECTION 350 MG
750.0000 mg | Freq: Once | INTRAVENOUS | Status: AC
  Administered 2014-09-24: 750 mg via INTRAVENOUS
  Filled 2014-09-24: qty 27.5

## 2014-09-24 MED ORDER — HEPARIN SOD (PORK) LOCK FLUSH 100 UNIT/ML IV SOLN: 500.0000 [IU] | Freq: Once | INTRAVENOUS | Status: AC | PRN

## 2014-09-24 MED ORDER — SODIUM CHLORIDE 0.9 % IV SOLN
400.0000 mg | Freq: Once | INTRAVENOUS | Status: AC
  Administered 2014-09-24: 400 mg via INTRAVENOUS
  Filled 2014-09-24: qty 20

## 2014-09-24 MED ORDER — SODIUM CHLORIDE 0.9 % IJ SOLN
10.0000 mL | INTRAMUSCULAR | Status: DC | PRN
  Administered 2014-09-24: 10 mL
  Filled 2014-09-24: qty 10

## 2014-09-24 MED ORDER — DEXTROSE 5 % IV SOLN
Freq: Once | INTRAVENOUS | Status: AC
  Administered 2014-09-24: 13:00:00 via INTRAVENOUS
  Filled 2014-09-24: qty 1000

## 2014-09-24 NOTE — Progress Notes (Signed)
Patient does not have living will.  Former smoker. 

## 2014-09-26 ENCOUNTER — Inpatient Hospital Stay: Payer: Medicare Other

## 2014-09-26 VITALS — BP 124/81 | HR 86 | Temp 96.0°F | Resp 18

## 2014-09-26 DIAGNOSIS — Z85841 Personal history of malignant neoplasm of brain: Secondary | ICD-10-CM | POA: Diagnosis not present

## 2014-09-26 DIAGNOSIS — C187 Malignant neoplasm of sigmoid colon: Secondary | ICD-10-CM

## 2014-09-26 DIAGNOSIS — Z85118 Personal history of other malignant neoplasm of bronchus and lung: Secondary | ICD-10-CM | POA: Diagnosis not present

## 2014-09-26 DIAGNOSIS — C787 Secondary malignant neoplasm of liver and intrahepatic bile duct: Secondary | ICD-10-CM | POA: Diagnosis not present

## 2014-09-26 DIAGNOSIS — Z8546 Personal history of malignant neoplasm of prostate: Secondary | ICD-10-CM | POA: Diagnosis not present

## 2014-09-26 DIAGNOSIS — Z5111 Encounter for antineoplastic chemotherapy: Secondary | ICD-10-CM | POA: Diagnosis not present

## 2014-09-26 MED ORDER — HEPARIN SOD (PORK) LOCK FLUSH 100 UNIT/ML IV SOLN
500.0000 [IU] | Freq: Once | INTRAVENOUS | Status: AC | PRN
  Administered 2014-09-26: 500 [IU]

## 2014-09-26 MED ORDER — SODIUM CHLORIDE 0.9 % IJ SOLN
10.0000 mL | INTRAMUSCULAR | Status: DC | PRN
  Administered 2014-09-26: 10 mL
  Filled 2014-09-26: qty 10

## 2014-09-26 MED ORDER — HEPARIN SOD (PORK) LOCK FLUSH 100 UNIT/ML IV SOLN
INTRAVENOUS | Status: AC
  Filled 2014-09-26: qty 5

## 2014-09-28 NOTE — Progress Notes (Signed)
[facility] [phone]  [phone]     Rakeen Bais OB: 04/29/1940  MR#: [id number]  CSN#:[id number]  Patient Care Team: No Pcp Per Patient as PCP - General (General Practice)  , MD (Oncology) Seeplaputhur G Sankar, MD (General Surgery)  CHIEF COMPLAINT:  Chief Complaint  Patient presents with  . 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 MRI scan of brain shows the right parietal lobe lesion (T4, N2, M1 disease) September, 2014 stage IV 3. Stereotactic radiation therapy to the brain been arranged.  Patient was started on carboplatinum and VP-16. 4. Chest radiation started on December, 2014 5. Patient finished radiation to the chest in January of 2015.  Received total 6000 rads 6. Finished consolidation chemotherapy on March 14, 2013 7. PET scan shows progressive disease in the liver.  September, 2015 8. Started on carboplatinum and CPT-11 from November 14, 2013 9.cancer of the mid sigmoid colon.  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 post sigmoid colectomy and liver biopsy March of 2016 T3 N0 M1 disease stage IV K-ras wild type HPI:        Carcinoma of sigmoid colon   04/24/2014 Initial Diagnosis Carcinoma of sigmoid colon    Oncology Flowsheet 07/08/2014 07/29/2014 07/29/2014 08/27/2014 08/27/2014 09/24/2014 09/24/2014  Day, Cycle   Day 1, Cycle 3   Day 1, Cycle 4   Day 1, 5    dexamethasone (DECADRON) IV   [ 10 mg ]   [ 10 mg ]   [ 10 mg ]    fluorouracil (ADRUCIL) IV 2,000 mg/m2 350 mg/m2 2,000 mg/m2 350 mg/m2 2,000 mg/m2 350 mg/m2 2,000 mg/m2  fosaprepitant (EMEND) IV   - - - - - -  leucovorin IV    750 mg   750 mg   750 mg    ondansetron (ZOFRAN) IV - [ 8 mg ]   [ 8 mg ]   [ 8 mg ]    oxaliplatin (ELOXATIN) IV   85 mg/m2   85 mg/m2   85 mg/m2    palonosetron (ALOXI) IV   - - - - - -  panitumumab (VECTIBIX) IV   400 mg   400 mg   400 mg      INTERVAL HISTORY:  Patient is here for ongoing evaluation and continuation of chemotherapy.  As developed rash on the face secondary to VECTIBIX.  No chills.  No fever.  No nausea.  No vomiting.  No diarrhea.  No tingling or numbness. He  is here for the next cycle of chemotherapy.. After starting doxycycline rash is improved.  No diarrhea.  No abdominal pain.  No nausea.  No vomiting.  No tearing.  No numbness. In July 22, 2014 Patient is here for ongoing evaluation and continuation of chemotherapy.  Rash has improved.  No diarrhea.  No abdominal pain.  This was suppressed no chills.  No fever.  Appetite has been stable.. August 20, 2014 Patient is here for next cycle of chemotherapy.  No chills no fever.  Patient had a wisdom tooth removed yesterday there is a lot of inflammation and bleeding.  Which is gradually getting better.. Swelling on the left side of the face   persist. September 10, 2014 Patient is here for next of chemotherapy.  No abdominal pain.  No chills.  No fever.  No tingling.  No numbness.  Appetite has been stable.  Patient is somewhat myelosuppressive breast.  But afebrile August, 2016 Patient is here for further evaluation and continuation of treatment.  Recovered from myelosuppression.  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 status: Alert oriented not any acute distress. HEENT: No headache.  No dizziness.  No soreness in the mouth.  No difficulty in swallowing.  No neck Wisdom tooth from the left side of the mouth was removed.  Swelling and bleeding  No palpable masses in the neck Lungs: No cough.  No  hemoptysis.  No chest pain.  No shortness of breath. Cardiac: No chest pain.  No paroxysmal dyspnea.  No orthopnea.  No palpitation Abdomen: No abdominal pain.  No diarrhea.  No nausea.  No vomiting.  Appetite is stable.  No rectal bleeding. Skin: No ecchymosis.   rash.  Improving.  No itching. Neuro: No headache.  No dizziness.  No weakness in upper or lower extremity.  No evidence of tingling numbness.. Lower extremity no edema As per HPI. Otherwise, a complete review of systems is negatve.  PAST MEDICAL HISTORY: Past Medical History  Diagnosis Date  . Kidney stones 1975  . Lung cancer 2014    Radiation therapy  . Liver cancer   . Colon cancer 01/2014  . Prostate cancer 2005    treated by Dr Wolf  . Brain cancer 01/2014    Chemotherapy and radiation treatment    PAST SURGICAL HISTORY: Past Surgical History  Procedure Laterality Date  . Kidney stones  1975  . Prostate surgery  2005    partial removal due to cancer  . Colon surgery  05-03-14    Resection of Sigmoid Colon and liver biopsy  . Portacath placement  2014    FAMILY HISTORY Family History  Problem Relation Age of Onset  . Lung cancer Father   . Alzheimer's disease Mother        ADVANCED DIRECTIVES: Patient does have advanced health care directive   HEALTH MAINTENANCE: Social History  Substance Use Topics  . Smoking status: Former Smoker -- 35 years  . Smokeless tobacco: Never Used  . Alcohol Use: No      No Known Allergies  Current Outpatient Prescriptions  Medication Sig Dispense Refill  . doxycycline (VIBRA-TABS) 100 MG tablet Take 1 tablet (100 mg total) by mouth 2 (two) times daily. 60 tablet 3  . ibuprofen (ADVIL,MOTRIN) 200 MG tablet Take 200 mg by mouth every 6 (six) hours as needed.    . lidocaine-prilocaine (EMLA) cream Apply to affected area once 30 g 3  . oxyCODONE (OXY IR/ROXICODONE) 5 MG immediate release tablet Take 5 mg by mouth every 6 (six) hours as needed.     . predniSONE  (DELTASONE) 10 MG tablet Take 1 tablet (10 mg total) by mouth daily with breakfast. 30 tablet 0  . PROVENTIL HFA 108 (90 BASE) MCG/ACT inhaler Inhale 90 mcg into the lungs every 6 (six) hours as needed.    . Triamcinolone Acetonide (TRIAMCINOLONE 0.1 % CREAM : EUCERIN) CREA Apply 1 application topically 2 (two) times daily. 1 each 3   No current facility-administered medications for this visit.   Facility-Administered Medications Ordered in Other Visits    Medication Dose Route Frequency Provider Last Rate Last Dose  . sodium chloride 0.9 % injection 10 mL  10 mL Intracatheter PRN  , MD   10 mL at 07/10/14 1428    OBJECTIVE: Filed Vitals:   09/24/14 0956  BP: 127/80  Pulse: 63  Temp: 94.7 F (34.8 C)     Body mass index is 18.99 kg/(m^2).    ECOG FS:1 - Symptomatic but completely ambulatory  Physical Exam  GENERAL:  Well developed, well nourished, sitting comfortably in the exam room in no acute distress. MENTAL STATUS:  Alert and oriented to person, place and time. HEAD:Normocephalic, atraumatic, face symmetric, no Cushingoid features. EYES:  .  Pupils equal round and reactive to light and accomodation.  No conjunctivitis or scleral icterus. ENT:  Oropharynx clear without lesion.  Tongue normal. Mucous membranes moist. .  had wisdom tooth removed which is still bleeding or redness and swelling RESPIRATORY:  Clear to auscultation without rales, wheezes or rhonchi. CARDIOVASCULAR:  Regular rate and rhythm without murmur, rub or gallop. BREAST:  Right breast without masses, skin changes or nipple discharge.  Left breast without masses, skin changes or nipple discharge. ABDOMEN:  Soft, non-tender, with active bowel sounds, and no hepatosplenomegaly.  No masses. BACK:  No CVA tenderness.  No tenderness on percussion of the back or rib cage. SKIN:  No rashes, ulcers or lesions. EXTREMITIES: No edema, no skin discoloration or tenderness.  No palpable cords. LYMPH NODES: No  palpable cervical, supraclavicular, axillary or inguinal adenopathy  NEUROLOGICAL: Unremarkable. PSYCH:  Appropriate.   LAB RESULTS:    No results found for: SPEP, UPEP  Lab Results  Component Value Date   WBC 4.1 09/24/2014   NEUTROABS 2.2 09/24/2014   HGB 10.1* 09/24/2014   HCT 30.4* 09/24/2014   MCV 95.5 09/24/2014   PLT 133* 09/24/2014      Chemistry             STUDIES Component     Latest Ref Rng 05/20/2014 05/23/2014 07/22/2014 08/20/2014 09/10/2014  CEA     0.0 - 4.7 ng/mL 775.3 (H) 603.3 (H) 65.4 (H) 26.3 (H) 14.6 (H)    ASSESSMENT:   stage IV undifferentiated carcinoma (small cell) of lung There is no evidence of recurrent disease.2.rectal bleeding.  Colonoscopy revealed mid sigmoid colon cancer 2.  Carcinoma of colon metastases to liver stage IV disease k-ras wild type  Progressive decline on CEA suggesting good response to the chemotherapy  PLAN:   Recover from myelosuppression proceed with next cycle of chemotherapy y   Carcinoma of sigmoid colon   Staging form: Colon and Rectum, AJCC 7th Edition     Clinical: Stage IVB (yT3, N1a, M1b) - Unsigned    , MD   09/28/2014 11:50 AM     

## 2014-10-08 ENCOUNTER — Other Ambulatory Visit: Payer: BC Managed Care – PPO

## 2014-10-08 ENCOUNTER — Ambulatory Visit: Payer: BC Managed Care – PPO

## 2014-10-08 ENCOUNTER — Ambulatory Visit: Payer: BC Managed Care – PPO | Admitting: Oncology

## 2014-10-10 LAB — SURGICAL PATHOLOGY

## 2014-10-11 ENCOUNTER — Encounter: Payer: Self-pay | Admitting: Oncology

## 2014-10-15 ENCOUNTER — Inpatient Hospital Stay: Payer: Medicare Other

## 2014-10-15 ENCOUNTER — Other Ambulatory Visit: Payer: Self-pay | Admitting: Oncology

## 2014-10-15 ENCOUNTER — Encounter: Payer: Self-pay | Admitting: Oncology

## 2014-10-15 ENCOUNTER — Inpatient Hospital Stay (HOSPITAL_BASED_OUTPATIENT_CLINIC_OR_DEPARTMENT_OTHER): Payer: Medicare Other | Admitting: Oncology

## 2014-10-15 VITALS — BP 137/93 | HR 118 | Temp 95.8°F | Wt 140.1 lb

## 2014-10-15 DIAGNOSIS — Z85841 Personal history of malignant neoplasm of brain: Secondary | ICD-10-CM | POA: Diagnosis not present

## 2014-10-15 DIAGNOSIS — R21 Rash and other nonspecific skin eruption: Secondary | ICD-10-CM

## 2014-10-15 DIAGNOSIS — Z85118 Personal history of other malignant neoplasm of bronchus and lung: Secondary | ICD-10-CM | POA: Diagnosis not present

## 2014-10-15 DIAGNOSIS — C787 Secondary malignant neoplasm of liver and intrahepatic bile duct: Secondary | ICD-10-CM | POA: Diagnosis not present

## 2014-10-15 DIAGNOSIS — Z7952 Long term (current) use of systemic steroids: Secondary | ICD-10-CM

## 2014-10-15 DIAGNOSIS — C187 Malignant neoplasm of sigmoid colon: Secondary | ICD-10-CM

## 2014-10-15 DIAGNOSIS — Z87891 Personal history of nicotine dependence: Secondary | ICD-10-CM

## 2014-10-15 DIAGNOSIS — Z79899 Other long term (current) drug therapy: Secondary | ICD-10-CM

## 2014-10-15 DIAGNOSIS — R070 Pain in throat: Secondary | ICD-10-CM

## 2014-10-15 DIAGNOSIS — Z8546 Personal history of malignant neoplasm of prostate: Secondary | ICD-10-CM

## 2014-10-15 DIAGNOSIS — Z5111 Encounter for antineoplastic chemotherapy: Secondary | ICD-10-CM | POA: Diagnosis not present

## 2014-10-15 LAB — CBC WITH DIFFERENTIAL/PLATELET
Basophils Absolute: 0 10*3/uL (ref 0–0.1)
Basophils Relative: 0 %
EOS PCT: 0 %
Eosinophils Absolute: 0 10*3/uL (ref 0–0.7)
HEMATOCRIT: 33 % — AB (ref 40.0–52.0)
HEMOGLOBIN: 11.3 g/dL — AB (ref 13.0–18.0)
LYMPHS ABS: 1.2 10*3/uL (ref 1.0–3.6)
Lymphocytes Relative: 20 %
MCH: 32.7 pg (ref 26.0–34.0)
MCHC: 34.2 g/dL (ref 32.0–36.0)
MCV: 95.7 fL (ref 80.0–100.0)
Monocytes Absolute: 1.6 10*3/uL — ABNORMAL HIGH (ref 0.2–1.0)
Monocytes Relative: 27 %
Neutro Abs: 3.2 10*3/uL (ref 1.4–6.5)
Neutrophils Relative %: 53 %
Platelets: 171 10*3/uL (ref 150–440)
RBC: 3.44 MIL/uL — ABNORMAL LOW (ref 4.40–5.90)
RDW: 16.9 % — ABNORMAL HIGH (ref 11.5–14.5)
WBC: 6 10*3/uL (ref 3.8–10.6)

## 2014-10-15 LAB — SURGICAL PATHOLOGY

## 2014-10-15 LAB — COMPREHENSIVE METABOLIC PANEL
ALT: 16 U/L — AB (ref 17–63)
AST: 22 U/L (ref 15–41)
Albumin: 3.9 g/dL (ref 3.5–5.0)
Alkaline Phosphatase: 89 U/L (ref 38–126)
Anion gap: 7 (ref 5–15)
BUN: 10 mg/dL (ref 6–20)
CHLORIDE: 102 mmol/L (ref 101–111)
CO2: 26 mmol/L (ref 22–32)
CREATININE: 0.8 mg/dL (ref 0.61–1.24)
Calcium: 9 mg/dL (ref 8.9–10.3)
GFR calc non Af Amer: >60 mL/min (ref >60)
Glucose, Bld: 110 mg/dL — ABNORMAL HIGH (ref 65–99)
Potassium: 4 mmol/L (ref 3.5–5.1)
SODIUM: 135 mmol/L (ref 135–145)
Total Bilirubin: 0.9 mg/dL (ref 0.3–1.2)
Total Protein: 7.7 g/dL (ref 6.5–8.1)

## 2014-10-15 LAB — MAGNESIUM: Magnesium: 2 mg/dL (ref 1.7–2.4)

## 2014-10-15 MED ORDER — LEUCOVORIN CALCIUM INJECTION 350 MG
750.0000 mg | Freq: Once | INTRAVENOUS | Status: AC
  Administered 2014-10-15: 750 mg via INTRAVENOUS
  Filled 2014-10-15: qty 20

## 2014-10-15 MED ORDER — DEXTROSE 5 % IV SOLN
85.0000 mg/m2 | Freq: Once | INTRAVENOUS | Status: AC
  Administered 2014-10-15: 160 mg via INTRAVENOUS
  Filled 2014-10-15: qty 32

## 2014-10-15 MED ORDER — SODIUM CHLORIDE 0.9 % IV SOLN
Freq: Once | INTRAVENOUS | Status: AC
  Administered 2014-10-15: 10:00:00 via INTRAVENOUS
  Filled 2014-10-15: qty 1000

## 2014-10-15 MED ORDER — SODIUM CHLORIDE 0.9 % IV SOLN
2000.0000 mg/m2 | INTRAVENOUS | Status: DC
  Administered 2014-10-15: 3800 mg via INTRAVENOUS
  Filled 2014-10-15: qty 76

## 2014-10-15 MED ORDER — FLUOROURACIL CHEMO INJECTION 2.5 GM/50ML
350.0000 mg/m2 | Freq: Once | INTRAVENOUS | Status: AC
  Administered 2014-10-15: 650 mg via INTRAVENOUS
  Filled 2014-10-15: qty 13

## 2014-10-15 MED ORDER — SODIUM CHLORIDE 0.9 % IV SOLN
Freq: Once | INTRAVENOUS | Status: AC
  Administered 2014-10-15: 10:00:00 via INTRAVENOUS
  Filled 2014-10-15: qty 4

## 2014-10-15 MED ORDER — HEPARIN SOD (PORK) LOCK FLUSH 100 UNIT/ML IV SOLN: 500.0000 [IU] | Freq: Once | INTRAVENOUS | Status: DC | PRN

## 2014-10-15 MED ORDER — SODIUM CHLORIDE 0.9 % IJ SOLN
10.0000 mL | INTRAMUSCULAR | Status: DC | PRN
  Administered 2014-10-15: 10 mL
  Filled 2014-10-15: qty 10

## 2014-10-15 MED ORDER — SODIUM CHLORIDE 0.9 % IV SOLN
400.0000 mg | Freq: Once | INTRAVENOUS | Status: AC
  Administered 2014-10-15: 400 mg via INTRAVENOUS
  Filled 2014-10-15: qty 20

## 2014-10-15 MED ORDER — DEXTROSE 5 % IV SOLN
Freq: Once | INTRAVENOUS | Status: AC
  Administered 2014-10-15: 12:00:00 via INTRAVENOUS
  Filled 2014-10-15: qty 1000

## 2014-10-15 MED ORDER — HEPARIN SOD (PORK) LOCK FLUSH 100 UNIT/ML IV SOLN: 500.0000 [IU] | Freq: Once | INTRAVENOUS | Status: AC | PRN

## 2014-10-15 NOTE — Progress Notes (Signed)
[name] [phone]  [phone]     Codylee Patil OB: [birth year]  MR#: [id number]  NIO#:[id number]  Patient Care Team: No Pcp Per Patient as PCP - General (General Practice) [name], MD (Oncology) [name], MD (General Surgery)  CHIEF COMPLAINT:  Chief Complaint  Patient presents with  . 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 MRI scan of brain shows the right parietal lobe lesion (T4, N2, M1 disease) September, 2014 stage IV 3. Stereotactic radiation therapy to the brain been arranged.  Patient was started on carboplatinum and VP-16. 4. Chest radiation started on December, 2014 5. Patient finished radiation to the chest in January of 2015.  Received total 6000 rads 6. Finished consolidation chemotherapy on March 14, 2013 7. PET scan shows progressive disease in the liver.  September, 2015 8. Started on carboplatinum and CPT-11 from November 14, 2013 9.cancer of the mid sigmoid colon.  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 patient is on FOLFOX and vectibix from April of 2016          04/24/2014 Initial Diagnosis Carcinoma of sigmoid colon     Oncology Flowsheet 07/08/2014 07/29/2014 07/29/2014 08/27/2014 08/27/2014 09/24/2014 09/24/2014  Day, Cycle   Day 1, Cycle 3   Day 1, Cycle 4   Day 1, 5    dexamethasone (DECADRON) IV   [ 10 mg ]   [ 10 mg ]   [ 10 mg ]    fluorouracil (ADRUCIL) IV 2,000 mg/m2 350 mg/m2 2,000 mg/m2 350 mg/m2 2,000 mg/m2 350 mg/m2 2,000 mg/m2  fosaprepitant (EMEND) IV   - - - - - -  leucovorin  IV   750 mg   750 mg   750 mg    ondansetron (ZOFRAN) IV - [ 8 mg ]   [ 8 mg ]   [ 8 mg ]    oxaliplatin (ELOXATIN) IV   85 mg/m2   85 mg/m2   85 mg/m2    palonosetron (ALOXI) IV   - - - - - -  panitumumab (VECTIBIX) IV   400 mg   400 mg   400 mg      INTERVAL HISTORY:  Patient is here for ongoing evaluation and continuation of chemotherapy.  As developed rash on the face secondary to [place].  No chills.  No fever.  No nausea.  No vomiting.  No diarrhea.  No tingling or numbness. He  is here for the next cycle of chemotherapy.. After starting doxycycline rash is improved.  No diarrhea.  No abdominal pain.  No nausea.  No vomiting.  No tearing.  No numbness. In July 22, 2014 Patient is here for ongoing evaluation and continuation of chemotherapy.  Rash has improved.  No diarrhea.  No abdominal pain.  This was suppressed no chills.  No fever.  Appetite has been stable.. August 20, 2014 Patient is here for next cycle of chemotherapy.  No chills no fever.  Patient had a wisdom tooth removed yesterday there is a lot of inflammation and bleeding.  Which is gradually getting better.. Swelling on the left side of  the face persist. September 10, 2014 Patient is here for next of chemotherapy.  No abdominal pain.  No chills.  No fever.  No tingling.  No numbness.  Appetite has been stable.  Patient is somewhat myelosuppressive breast.  But afebrile August, 2016 Patient is here for further evaluation and continuation of treatment.  Recovered from myelosuppression.  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  Rash has improved on doxycycline.  Patient complains of intermittent right throat pain according to patient has for last one year.  Has some difficulty swallowing.  No nausea no vomiting no tingling or numbness no soreness  in the mouth other than right-sided pain REVIEW OF SYSTEMS:   ROS Gen. status: Alert oriented not any acute distress. HEENT: No headache.  No dizziness.  No soreness in the mouth.  Pain in the right side of the throat.  Has some difficulty swallowing Wisdom tooth from the left side of the mouth was removed.  Swelling and bleeding  No palpable masses in the neck Lungs: No cough.  No hemoptysis.  No chest pain.  No shortness of breath. Cardiac: No chest pain.  No paroxysmal dyspnea.  No orthopnea.  No palpitation Abdomen: No abdominal pain.  No diarrhea.  No nausea.  No vomiting.  Appetite is stable.  No rectal bleeding. Skin: No ecchymosis.   rash.  Improving.  No itching. Neuro: No headache.  No dizziness.  No weakness in upper or lower extremity.  No evidence of tingling numbness.. Lower extremity no edema As per HPI. Otherwise, a complete review of systems is negatve.  PAST MEDICAL HISTORY: Past Medical History  Diagnosis Date  . Kidney stones 1975  . Lung cancer 2014    Radiation therapy  . Liver cancer   . Colon cancer 01/2014  . Prostate cancer 2005    treated by Dr Eliberto Ivory  . Brain cancer 01/2014    Chemotherapy and radiation treatment    PAST SURGICAL HISTORY: Past Surgical History  Procedure Laterality Date  . Kidney stones  1975  . Prostate surgery  2005    partial removal due to cancer  . Colon surgery  05-03-14    Resection of Sigmoid Colon and liver biopsy  . Portacath placement  2014    FAMILY HISTORY Family History  Problem Relation Age of Onset  . Lung cancer Father   . Alzheimer's disease Mother        ADVANCED DIRECTIVES: Patient does have advanced health care directive   HEALTH MAINTENANCE: Social History  Substance Use Topics  . Smoking status: Former Smoker -- 35 years  . Smokeless tobacco: Never Used  . Alcohol Use: No      No Known Allergies  Current Outpatient Prescriptions  Medication Sig Dispense Refill  . doxycycline (VIBRA-TABS)  100 MG tablet Take 1 tablet (100 mg total) by mouth 2 (two) times daily. 60 tablet 3  . ibuprofen (ADVIL,MOTRIN) 200 MG tablet Take 200 mg by mouth every 6 (six) hours as needed.    . lidocaine-prilocaine (EMLA) cream Apply to affected area once 30 g 3  . oxyCODONE (OXY IR/ROXICODONE) 5 MG immediate release tablet Take 5 mg by mouth every 6 (six) hours as needed.     . predniSONE (DELTASONE) 10 MG tablet Take 1 tablet (10 mg total) by mouth daily with  breakfast. 30 tablet 0  . PROVENTIL HFA 108 (90 BASE) MCG/ACT inhaler Inhale 90 mcg into the lungs every 6 (six) hours as needed.    . Triamcinolone Acetonide (TRIAMCINOLONE 0.1 % CREAM : EUCERIN) CREA Apply 1 application topically 2 (two) times daily. 1 each 3   No current facility-administered medications for this visit.   Facility-Administered Medications Ordered in Other Visits  Medication Dose Route Frequency Provider Last Rate Last Dose  . heparin lock flush 100 unit/mL  500 Units Intracatheter Once PRN [name], MD      . sodium chloride 0.9 % injection 10 mL  10 mL Intracatheter PRN [name], MD   10 mL at 07/10/14 1428  . sodium chloride 0.9 % injection 10 mL  10 mL Intracatheter PRN [name], MD   10 mL at 10/15/14 0853    OBJECTIVE: Filed Vitals:   10/15/14 0901  BP: 137/93  Pulse: 118  Temp: 95.8 F (35.4 C)     Body mass index is 17.98 kg/(m^2).    ECOG FS:1 - Symptomatic but completely ambulatory  Physical Exam  GENERAL:  Well developed, well nourished, sitting comfortably in the exam room in no acute distress. MENTAL STATUS:  Alert and oriented to person, place and time. HEAD:Normocephalic, atraumatic, face symmetric, no Cushingoid features. EYES:  .  Pupils equal round and reactive to light and accomodation.  No conjunctivitis or scleral icterus. ENT:  Oropharynx clear without lesion.  Tongue normal. Mucous membranes moist. . Patient has pain intermittently on the right side of the throat has enlarged tonsils  right is more enlarged than the left I do not see any ulcer or fungating mass RESPIRATORY:  Clear to auscultation without rales, wheezes or rhonchi. CARDIOVASCULAR:  Regular rate and rhythm without murmur, rub or gallop. BREAST:  Right breast without masses, skin changes or nipple discharge.  Left breast without masses, skin changes or nipple discharge. ABDOMEN:  Soft, non-tender, with active bowel sounds, and no hepatosplenomegaly.  No masses. BACK:  No CVA tenderness.  No tenderness on percussion of the back or rib cage. SKIN:  No rashes, ulcers or lesions. EXTREMITIES: No edema, no skin discoloration or tenderness.  No palpable cords. LYMPH NODES: No palpable cervical, supraclavicular, axillary or inguinal adenopathy  NEUROLOGICAL: Unremarkable. PSYCH:  Appropriate.   LAB RESULTS:    No results found for: SPEP, UPEP  Lab Results  Component Value Date   WBC 6.0 10/15/2014   NEUTROABS 3.2 10/15/2014   HGB 11.3* 10/15/2014   HCT 33.0* 10/15/2014   MCV 95.7 10/15/2014   PLT 171 10/15/2014      Chemistry             STUDIES Component     Latest Ref Rng 05/20/2014 05/23/2014 07/22/2014 08/20/2014 09/10/2014  CEA     0.0 - 4.7 ng/mL 775.3 (H) 603.3 (H) 65.4 (H) 26.3 (H) 14.6 (H)    ASSESSMENT:   stage IV undifferentiated carcinoma (small cell) of lung There is no evidence of recurrent disease.2.rectal bleeding.  Colonoscopy revealed mid sigmoid colon cancer 2.  Carcinoma of colon metastases to liver stage IV disease k-ras wild type  Progressive decline on CEA suggesting good response to the chemotherapy  2.  Because of persistent pain on the right side of the throat will get ENT evaluation  PLAN:   Continue chemotherapy. By cea criteria patient is responding to the treatment. Get ENT evaluation cause of persistent pain on the right side of the throat.  Slightly enlarged  tonsil on the right side.   Carcinoma of sigmoid colon   Staging form: Colon and Rectum, AJCC 7th  Edition     Clinical: Stage IVB (yT3, N1a, M1b) - Unsigned   [name], MD   10/15/2014 9:35 AM

## 2014-10-15 NOTE — Progress Notes (Signed)
Patient does not have living will.  Former smoker.  Patient states his throat is sore on the right side just under his jawline.  States it is hard to swallow solid food.

## 2014-10-17 ENCOUNTER — Inpatient Hospital Stay: Payer: Medicare Other | Attending: Oncology

## 2014-10-17 DIAGNOSIS — Z9221 Personal history of antineoplastic chemotherapy: Secondary | ICD-10-CM | POA: Insufficient documentation

## 2014-10-17 DIAGNOSIS — Z87891 Personal history of nicotine dependence: Secondary | ICD-10-CM | POA: Insufficient documentation

## 2014-10-17 DIAGNOSIS — R21 Rash and other nonspecific skin eruption: Secondary | ICD-10-CM | POA: Diagnosis not present

## 2014-10-17 DIAGNOSIS — Z923 Personal history of irradiation: Secondary | ICD-10-CM | POA: Diagnosis not present

## 2014-10-17 DIAGNOSIS — Z5111 Encounter for antineoplastic chemotherapy: Secondary | ICD-10-CM | POA: Diagnosis not present

## 2014-10-17 DIAGNOSIS — Z85118 Personal history of other malignant neoplasm of bronchus and lung: Secondary | ICD-10-CM | POA: Insufficient documentation

## 2014-10-17 DIAGNOSIS — Z8546 Personal history of malignant neoplasm of prostate: Secondary | ICD-10-CM | POA: Insufficient documentation

## 2014-10-17 DIAGNOSIS — C787 Secondary malignant neoplasm of liver and intrahepatic bile duct: Secondary | ICD-10-CM | POA: Diagnosis not present

## 2014-10-17 DIAGNOSIS — Z79899 Other long term (current) drug therapy: Secondary | ICD-10-CM | POA: Diagnosis not present

## 2014-10-17 DIAGNOSIS — K1120 Sialoadenitis, unspecified: Secondary | ICD-10-CM | POA: Insufficient documentation

## 2014-10-17 DIAGNOSIS — Z85841 Personal history of malignant neoplasm of brain: Secondary | ICD-10-CM | POA: Insufficient documentation

## 2014-10-17 DIAGNOSIS — Z7952 Long term (current) use of systemic steroids: Secondary | ICD-10-CM | POA: Diagnosis not present

## 2014-10-17 DIAGNOSIS — Z801 Family history of malignant neoplasm of trachea, bronchus and lung: Secondary | ICD-10-CM | POA: Insufficient documentation

## 2014-10-17 DIAGNOSIS — C187 Malignant neoplasm of sigmoid colon: Secondary | ICD-10-CM

## 2014-10-17 DIAGNOSIS — Z87442 Personal history of urinary calculi: Secondary | ICD-10-CM | POA: Insufficient documentation

## 2014-10-17 MED ORDER — SODIUM CHLORIDE 0.9 % IJ SOLN
10.0000 mL | INTRAMUSCULAR | Status: DC | PRN
  Administered 2014-10-17: 10 mL
  Filled 2014-10-17: qty 10

## 2014-10-17 MED ORDER — HEPARIN SOD (PORK) LOCK FLUSH 100 UNIT/ML IV SOLN
500.0000 [IU] | Freq: Once | INTRAVENOUS | Status: AC | PRN
  Administered 2014-10-17: 500 [IU]
  Filled 2014-10-17: qty 5

## 2014-10-30 ENCOUNTER — Ambulatory Visit
Admission: RE | Admit: 2014-10-30 | Discharge: 2014-10-30 | Disposition: A | Discharge status: Home / Self Care | Payer: Medicare Other | Source: Ambulatory Visit | Attending: Oncology | Admitting: Oncology

## 2014-10-30 DIAGNOSIS — C799 Secondary malignant neoplasm of unspecified site: Secondary | ICD-10-CM | POA: Diagnosis present

## 2014-10-30 DIAGNOSIS — C787 Secondary malignant neoplasm of liver and intrahepatic bile duct: Secondary | ICD-10-CM | POA: Diagnosis not present

## 2014-10-30 DIAGNOSIS — C3490 Malignant neoplasm of unspecified part of unspecified bronchus or lung: Secondary | ICD-10-CM

## 2014-10-30 DIAGNOSIS — R911 Solitary pulmonary nodule: Secondary | ICD-10-CM | POA: Diagnosis not present

## 2014-10-30 MED ORDER — IOHEXOL 300 MG/ML  SOLN
100.0000 mL | Freq: Once | INTRAMUSCULAR | Status: AC | PRN
Start: 2014-10-30 — End: 2014-10-30
  Administered 2014-10-30: 100 mL via INTRAVENOUS

## 2014-11-02 DIAGNOSIS — Z5111 Encounter for antineoplastic chemotherapy: Secondary | ICD-10-CM | POA: Diagnosis not present

## 2014-11-02 DIAGNOSIS — Z8546 Personal history of malignant neoplasm of prostate: Secondary | ICD-10-CM | POA: Diagnosis not present

## 2014-11-02 DIAGNOSIS — C787 Secondary malignant neoplasm of liver and intrahepatic bile duct: Secondary | ICD-10-CM | POA: Diagnosis not present

## 2014-11-02 DIAGNOSIS — Z85841 Personal history of malignant neoplasm of brain: Secondary | ICD-10-CM | POA: Diagnosis not present

## 2014-11-02 DIAGNOSIS — C187 Malignant neoplasm of sigmoid colon: Secondary | ICD-10-CM | POA: Diagnosis not present

## 2014-11-02 DIAGNOSIS — Z85118 Personal history of other malignant neoplasm of bronchus and lung: Secondary | ICD-10-CM | POA: Diagnosis not present

## 2014-11-05 ENCOUNTER — Inpatient Hospital Stay: Payer: Medicare Other

## 2014-11-05 ENCOUNTER — Encounter: Payer: Self-pay | Admitting: Oncology

## 2014-11-05 ENCOUNTER — Inpatient Hospital Stay (HOSPITAL_BASED_OUTPATIENT_CLINIC_OR_DEPARTMENT_OTHER): Payer: Medicare Other | Admitting: Oncology

## 2014-11-05 VITALS — BP 125/80 | HR 96 | Temp 94.5°F | Wt 146.0 lb

## 2014-11-05 DIAGNOSIS — Z87891 Personal history of nicotine dependence: Secondary | ICD-10-CM

## 2014-11-05 DIAGNOSIS — Z8546 Personal history of malignant neoplasm of prostate: Secondary | ICD-10-CM

## 2014-11-05 DIAGNOSIS — C787 Secondary malignant neoplasm of liver and intrahepatic bile duct: Secondary | ICD-10-CM

## 2014-11-05 DIAGNOSIS — K1120 Sialoadenitis, unspecified: Secondary | ICD-10-CM

## 2014-11-05 DIAGNOSIS — C187 Malignant neoplasm of sigmoid colon: Secondary | ICD-10-CM | POA: Diagnosis not present

## 2014-11-05 DIAGNOSIS — Z85118 Personal history of other malignant neoplasm of bronchus and lung: Secondary | ICD-10-CM | POA: Diagnosis not present

## 2014-11-05 DIAGNOSIS — Z5111 Encounter for antineoplastic chemotherapy: Secondary | ICD-10-CM | POA: Diagnosis not present

## 2014-11-05 DIAGNOSIS — Z9221 Personal history of antineoplastic chemotherapy: Secondary | ICD-10-CM

## 2014-11-05 DIAGNOSIS — Z7952 Long term (current) use of systemic steroids: Secondary | ICD-10-CM

## 2014-11-05 DIAGNOSIS — Z79899 Other long term (current) drug therapy: Secondary | ICD-10-CM

## 2014-11-05 DIAGNOSIS — Z85841 Personal history of malignant neoplasm of brain: Secondary | ICD-10-CM | POA: Diagnosis not present

## 2014-11-05 DIAGNOSIS — Z923 Personal history of irradiation: Secondary | ICD-10-CM

## 2014-11-05 DIAGNOSIS — R21 Rash and other nonspecific skin eruption: Secondary | ICD-10-CM

## 2014-11-05 LAB — CBC WITH DIFFERENTIAL/PLATELET
BASOS ABS: 0 10*3/uL (ref 0–0.1)
BASOS PCT: 0 %
Eosinophils Absolute: 0.1 10*3/uL (ref 0–0.7)
Eosinophils Relative: 4 %
HEMATOCRIT: 32.3 % — AB (ref 40.0–52.0)
Hemoglobin: 10.9 g/dL — ABNORMAL LOW (ref 13.0–18.0)
LYMPHS PCT: 43 %
Lymphs Abs: 1.1 10*3/uL (ref 1.0–3.6)
MCH: 33 pg (ref 26.0–34.0)
MCHC: 33.9 g/dL (ref 32.0–36.0)
MCV: 97.3 fL (ref 80.0–100.0)
MONO ABS: 0.5 10*3/uL (ref 0.2–1.0)
Monocytes Relative: 21 %
NEUTROS ABS: 0.8 10*3/uL — AB (ref 1.4–6.5)
Neutrophils Relative %: 32 %
PLATELETS: 139 10*3/uL — AB (ref 150–440)
RBC: 3.32 MIL/uL — AB (ref 4.40–5.90)
RDW: 17.2 % — AB (ref 11.5–14.5)
WBC: 2.5 10*3/uL — AB (ref 3.8–10.6)

## 2014-11-05 LAB — COMPREHENSIVE METABOLIC PANEL
ALBUMIN: 3.9 g/dL (ref 3.5–5.0)
ALT: 17 U/L (ref 17–63)
AST: 33 U/L (ref 15–41)
Alkaline Phosphatase: 85 U/L (ref 38–126)
Anion gap: 4 — ABNORMAL LOW (ref 5–15)
BILIRUBIN TOTAL: 0.5 mg/dL (ref 0.3–1.2)
BUN: 11 mg/dL (ref 6–20)
CHLORIDE: 106 mmol/L (ref 101–111)
CO2: 29 mmol/L (ref 22–32)
CREATININE: 0.76 mg/dL (ref 0.61–1.24)
Calcium: 8.9 mg/dL (ref 8.9–10.3)
GFR calc Af Amer: >60 mL/min (ref >60)
GLUCOSE: 108 mg/dL — AB (ref 65–99)
POTASSIUM: 3.6 mmol/L (ref 3.5–5.1)
Sodium: 139 mmol/L (ref 135–145)
Total Protein: 7 g/dL (ref 6.5–8.1)

## 2014-11-05 LAB — MAGNESIUM: Magnesium: 1.8 mg/dL (ref 1.7–2.4)

## 2014-11-05 MED ORDER — HEPARIN SOD (PORK) LOCK FLUSH 100 UNIT/ML IV SOLN
INTRAVENOUS | Status: AC
  Filled 2014-11-05: qty 5

## 2014-11-05 MED ORDER — SODIUM CHLORIDE 0.9 % IJ SOLN
10.0000 mL | Freq: Once | INTRAMUSCULAR | Status: AC
  Administered 2014-11-05: 10 mL via INTRAVENOUS
  Filled 2014-11-05: qty 10

## 2014-11-05 MED ORDER — HEPARIN SOD (PORK) LOCK FLUSH 100 UNIT/ML IV SOLN
500.0000 [IU] | Freq: Once | INTRAVENOUS | Status: AC
  Administered 2014-11-05: 500 [IU] via INTRAVENOUS

## 2014-11-05 NOTE — Progress Notes (Signed)
Patient does not have living will.  Former smoker. 

## 2014-11-05 NOTE — Progress Notes (Signed)
[place] @ [facility] [phone]  [phone]) [phone]     Tejay Hubert OB: December 16, 1940  MR#: [id number]  OJJ#:[id number]  Patient Care Team: No Pcp Per Patient as PCP - General (General Practice) [name], MD (Oncology) [name], MD (General Surgery)  CHIEF COMPLAINT:  Chief Complaint  Patient presents with  . 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 MRI scan of brain shows the right parietal lobe lesion (T4, N2, M1 disease) September, 2014 stage IV 3. Stereotactic radiation therapy to the brain been arranged.  Patient was started on carboplatinum and VP-16. 4. Chest radiation started on December, 2014 5. Patient finished radiation to the chest in January of 2015.  Received total 6000 rads 6. Finished consolidation chemotherapy on March 14, 2013 7. PET scan shows progressive disease in the liver.  September, 2015 8. Started on carboplatinum and CPT-11 from November 14, 2013 9.cancer of the mid sigmoid colon.  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 patient is on FOLFOX and vectibix from April of 2016          04/24/2014 Initial Diagnosis Carcinoma of sigmoid colon     Oncology Flowsheet 07/29/2014 08/27/2014 08/27/2014 09/24/2014 09/24/2014 10/15/2014 10/15/2014  Day, Cycle   Day 1, Cycle 4   Day 1, 5   Day 1, Cycle 6    dexamethasone (DECADRON) IV   [ 10 mg ]   [ 10 mg ]   [ 10 mg ]    fluorouracil (ADRUCIL) IV 2,000 mg/m2 350 mg/m2 2,000 mg/m2 350 mg/m2 2,000 mg/m2 350 mg/m2 2,000 mg/m2  fosaprepitant (EMEND) IV - - - - - - -    leucovorin IV   750 mg   750 mg   750 mg    ondansetron (ZOFRAN) IV   [ 8 mg ]   [ 8 mg ]   [ 8 mg ]    oxaliplatin (ELOXATIN) IV   85 mg/m2   85 mg/m2   85 mg/m2    palonosetron (ALOXI) IV - - - - - - -  panitumumab (VECTIBIX) IV   400 mg   400 mg   400 mg      INTERVAL HISTORY:  Patient is here for ongoing evaluation and continuation of chemotherapy.  As developed rash on the face secondary to [place].  No chills.  No fever.  No nausea.  No vomiting.  No diarrhea.  No tingling or numbness. He  is here for the next cycle of chemotherapy.. After starting doxycycline rash is improved.  No diarrhea.  No abdominal pain.  No nausea.  No vomiting.  No tearing.  No numbness.  Patient is here for next cycle of chemotherapy.  No chills no fever.  Patient had a wisdom tooth removed yesterday there is a lot of inflammation and bleeding.  Which is gradually getting better.. Swelling on the left side of the face persist. September 10, 2014 Patient is here for next of chemotherapy.  No abdominal pain.  No chills.  No fever.  No tingling.  No numbness.  Appetite has been stable.  Patient  is somewhat myelosuppressive breast.  But afebrile August, 2016 Patient is here for further evaluation and continuation of treatment.  Recovered from myelosuppression.  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  Rash has improved on doxycycline.  Patient complains of intermittent right throat pain according to patient has for last one year.  Has some difficulty swallowing.  No nausea no vomiting no tingling or numbness no soreness in the mouth other than right-sided pain  November 05, 2014 Patient is here for ongoing evaluation and continuation of chemotherapy.  Had CEA which is now decreased to 14.6.  CT scan shows significant  improvement.  CT scan has been reviewed independently. Rashes under better control with doxycycline. Patient does not have any nausea vomiting diarrhea appetite has been stable.  No chills.  No fever. No abdominal pain. Here to review the results of the CT scan and continuation of chemotherapy REVIEW OF SYSTEMS:   ROS Gen. status: Alert oriented not any acute distress. HEENT: No headache.  No dizziness.  No soreness in the mouth.  Pain in the right side of the throat.  Has some difficulty swallowing Wisdom tooth from the left side of the mouth was removed.  Swelling and bleeding  No palpable masses in the neck Lungs: No cough.  No hemoptysis.  No chest pain.  No shortness of breath. Cardiac: No chest pain.  No paroxysmal dyspnea.  No orthopnea.  No palpitation Abdomen: No abdominal pain.  No diarrhea.  No nausea.  No vomiting.  Appetite is stable.  No rectal bleeding. Skin: No ecchymosis.   rash.  Improving.  No itching. Neuro: No headache.  No dizziness.  No weakness in upper or lower extremity.  No evidence of tingling numbness.. Lower extremity no edema As per HPI. Otherwise, a complete review of systems is negatve.  PAST MEDICAL HISTORY: Past Medical History  Diagnosis Date  . Kidney stones 1975  . Lung cancer 2014    Radiation therapy  . Liver cancer   . Colon cancer 01/2014  . Prostate cancer 2005    treated by Dr Eliberto Ivory  . Brain cancer 01/2014    Chemotherapy and radiation treatment    PAST SURGICAL HISTORY: Past Surgical History  Procedure Laterality Date  . Kidney stones  1975  . Prostate surgery  2005    partial removal due to cancer  . Colon surgery  05-03-14    Resection of Sigmoid Colon and liver biopsy  . Portacath placement  2014    FAMILY HISTORY Family History  Problem Relation Age of Onset  . Lung cancer Father   . Alzheimer's disease Mother        ADVANCED DIRECTIVES: Patient does have advanced health care directive   HEALTH MAINTENANCE: Social  History  Substance Use Topics  . Smoking status: Former Smoker -- 35 years  . Smokeless tobacco: Never Used  . Alcohol Use: No      No Known Allergies  Current Outpatient Prescriptions  Medication Sig Dispense Refill  . doxycycline (VIBRA-TABS) 100 MG tablet Take 1 tablet (100 mg total) by mouth 2 (two) times daily. 60 tablet 3  . ibuprofen (ADVIL,MOTRIN) 200 MG tablet Take 200 mg by mouth every 6 (six) hours as needed.    . lidocaine-prilocaine (EMLA) cream Apply to affected area once 30 g 3  .  oxyCODONE (OXY IR/ROXICODONE) 5 MG immediate release tablet Take 5 mg by mouth every 6 (six) hours as needed.     . predniSONE (DELTASONE) 10 MG tablet Take 1 tablet (10 mg total) by mouth daily with breakfast. 30 tablet 0  . PROVENTIL HFA 108 (90 BASE) MCG/ACT inhaler Inhale 90 mcg into the lungs every 6 (six) hours as needed.    . Triamcinolone Acetonide (TRIAMCINOLONE 0.1 % CREAM : EUCERIN) CREA Apply 1 application topically 2 (two) times daily. 1 each 3   No current facility-administered medications for this visit.   Facility-Administered Medications Ordered in Other Visits  Medication Dose Route Frequency Provider Last Rate Last Dose  . sodium chloride 0.9 % injection 10 mL  10 mL Intracatheter PRN [name], MD   10 mL at 07/10/14 1428    OBJECTIVE: Filed Vitals:   11/05/14 0933  BP: 125/80  Pulse: 96  Temp: 94.5 F (34.7 C)     Body mass index is 18.74 kg/(m^2).    ECOG FS:1 - Symptomatic but completely ambulatory  Physical Exam  GENERAL:  Well developed, well nourished, sitting comfortably in the exam room in no acute distress. MENTAL STATUS:  Alert and oriented to person, place and time. HEAD:Normocephalic, atraumatic, face symmetric, no Cushingoid features. EYES:  .  Pupils equal round and reactive to light and accomodation.  No conjunctivitis or scleral icterus. ENT:  Oropharynx clear without lesion.  Tongue normal. Mucous membranes moist. . Patient has pain  intermittently on the right side of the throat has enlarged tonsils right is more enlarged than the left I do not see any ulcer or fungating mass RESPIRATORY:  Clear to auscultation without rales, wheezes or rhonchi. CARDIOVASCULAR:  Regular rate and rhythm without murmur, rub or gallop. BREAST:  Right breast without masses, skin changes or nipple discharge.  Left breast without masses, skin changes or nipple discharge. ABDOMEN:  Soft, non-tender, with active bowel sounds, and no hepatosplenomegaly.  No masses. BACK:  No CVA tenderness.  No tenderness on percussion of the back or rib cage. SKIN:  No rashes, ulcers or lesions. EXTREMITIES: No edema, no skin discoloration or tenderness.  No palpable cords. LYMPH NODES: No palpable cervical, supraclavicular, axillary or inguinal adenopathy  NEUROLOGICAL: Unremarkable. PSYCH:  Appropriate.   LAB RESULTS:    No results found for: SPEP, UPEP  Lab Results  Component Value Date   WBC 2.5* 11/05/2014   NEUTROABS 0.8* 11/05/2014   HGB 10.9* 11/05/2014   HCT 32.3* 11/05/2014   MCV 97.3 11/05/2014   PLT 139* 11/05/2014      Chemistry             STUDIES Component     Latest Ref Rng 05/20/2014 05/23/2014 07/22/2014 08/20/2014 09/10/2014  CEA     0.0 - 4.7 ng/mL 775.3 (H) 603.3 (H) 65.4 (H) 26.3 (H) 14.6 (H)    ASSESSMENT:   stage IV undifferentiated carcinoma (small cell) of lung There is no evidence of recurrent disease.2.rectal bleeding.  Colonoscopy revealed mid sigmoid colon cancer 2.  Carcinoma of colon metastases to liver stage IV disease k-ras wild type  Progressive decline on CEA suggesting good response to the chemotherapy ET scan has been reviewed independently and shows progressive improvement. 3.  Patient has myelosuppression without any fever with absolute neutrophil count today is 800 Hold chemotherapy until next week until absolute neutrophil count improves more than 1200. Reevaluate patient in 3 weeks for continuation  of chemotherapy  2.  Because of persistent pain  on the right side of the throat will get ENT evaluation ENT evaluation reveals sialoadenitis.  Patient's pain is improved  PLAN:   Hold chemotherapy   Till next Week If patient spikes any fever to get in touch with me   Carcinoma of sigmoid colon   Staging form: Colon and Rectum, AJCC 7th Edition     Clinical: Stage IVB (yT3, N1a, M1b) - Marni Griffon, MD   11/05/2014 9:52 AM

## 2014-11-06 LAB — CEA: CEA: 10.4 ng/mL — AB (ref 0.0–4.7)

## 2014-11-12 ENCOUNTER — Inpatient Hospital Stay: Payer: Medicare Other

## 2014-11-12 VITALS — BP 128/78 | HR 89 | Temp 96.0°F | Resp 18

## 2014-11-12 DIAGNOSIS — Z5111 Encounter for antineoplastic chemotherapy: Secondary | ICD-10-CM | POA: Diagnosis not present

## 2014-11-12 DIAGNOSIS — Z8546 Personal history of malignant neoplasm of prostate: Secondary | ICD-10-CM | POA: Diagnosis not present

## 2014-11-12 DIAGNOSIS — C187 Malignant neoplasm of sigmoid colon: Secondary | ICD-10-CM | POA: Diagnosis not present

## 2014-11-12 DIAGNOSIS — Z85118 Personal history of other malignant neoplasm of bronchus and lung: Secondary | ICD-10-CM | POA: Diagnosis not present

## 2014-11-12 DIAGNOSIS — Z85841 Personal history of malignant neoplasm of brain: Secondary | ICD-10-CM | POA: Diagnosis not present

## 2014-11-12 DIAGNOSIS — C787 Secondary malignant neoplasm of liver and intrahepatic bile duct: Secondary | ICD-10-CM | POA: Diagnosis not present

## 2014-11-12 LAB — CBC WITH DIFFERENTIAL/PLATELET
BASOS PCT: 1 %
Basophils Absolute: 0 10*3/uL (ref 0–0.1)
EOS ABS: 0.1 10*3/uL (ref 0–0.7)
EOS PCT: 4 %
HCT: 34 % — ABNORMAL LOW (ref 40.0–52.0)
HEMOGLOBIN: 11.4 g/dL — AB (ref 13.0–18.0)
LYMPHS ABS: 0.9 10*3/uL — AB (ref 1.0–3.6)
Lymphocytes Relative: 23 %
MCH: 32.9 pg (ref 26.0–34.0)
MCHC: 33.6 g/dL (ref 32.0–36.0)
MCV: 97.8 fL (ref 80.0–100.0)
MONO ABS: 0.6 10*3/uL (ref 0.2–1.0)
MONOS PCT: 16 %
Neutro Abs: 2.2 10*3/uL (ref 1.4–6.5)
Neutrophils Relative %: 56 %
Platelets: 153 10*3/uL (ref 150–440)
RBC: 3.48 MIL/uL — ABNORMAL LOW (ref 4.40–5.90)
RDW: 17 % — AB (ref 11.5–14.5)
WBC: 3.9 10*3/uL (ref 3.8–10.6)

## 2014-11-12 LAB — COMPREHENSIVE METABOLIC PANEL
ALBUMIN: 3.8 g/dL (ref 3.5–5.0)
ALK PHOS: 86 U/L (ref 38–126)
ALT: 19 U/L (ref 17–63)
ANION GAP: 6 (ref 5–15)
AST: 31 U/L (ref 15–41)
BUN: 9 mg/dL (ref 6–20)
CALCIUM: 9.2 mg/dL (ref 8.9–10.3)
CO2: 28 mmol/L (ref 22–32)
Chloride: 105 mmol/L (ref 101–111)
Creatinine, Ser: 0.74 mg/dL (ref 0.61–1.24)
GFR calc non Af Amer: >60 mL/min (ref >60)
GLUCOSE: 76 mg/dL (ref 65–99)
POTASSIUM: 4.1 mmol/L (ref 3.5–5.1)
SODIUM: 139 mmol/L (ref 135–145)
TOTAL PROTEIN: 7 g/dL (ref 6.5–8.1)
Total Bilirubin: 0.4 mg/dL (ref 0.3–1.2)

## 2014-11-12 MED ORDER — FLUOROURACIL CHEMO INJECTION 2.5 GM/50ML
350.0000 mg/m2 | Freq: Once | INTRAVENOUS | Status: AC
  Administered 2014-11-12: 650 mg via INTRAVENOUS
  Filled 2014-11-12: qty 13

## 2014-11-12 MED ORDER — DEXTROSE 5 % IV SOLN
750.0000 mg | Freq: Once | INTRAVENOUS | Status: AC
  Administered 2014-11-12: 750 mg via INTRAVENOUS
  Filled 2014-11-12: qty 25

## 2014-11-12 MED ORDER — SODIUM CHLORIDE 0.9 % IV SOLN
Freq: Once | INTRAVENOUS | Status: AC
  Administered 2014-11-12: 10:00:00 via INTRAVENOUS
  Filled 2014-11-12: qty 4

## 2014-11-12 MED ORDER — PANITUMUMAB CHEMO INJECTION 100 MG/5ML
400.0000 mg | Freq: Once | INTRAVENOUS | Status: AC
  Administered 2014-11-12: 400 mg via INTRAVENOUS
  Filled 2014-11-12: qty 20

## 2014-11-12 MED ORDER — DEXTROSE 5 % IV SOLN
Freq: Once | INTRAVENOUS | Status: AC
  Administered 2014-11-12: 12:00:00 via INTRAVENOUS
  Filled 2014-11-12: qty 1000

## 2014-11-12 MED ORDER — OXALIPLATIN CHEMO INJECTION 100 MG/20ML
85.0000 mg/m2 | Freq: Once | INTRAVENOUS | Status: AC
  Administered 2014-11-12: 160 mg via INTRAVENOUS
  Filled 2014-11-12: qty 20

## 2014-11-12 MED ORDER — SODIUM CHLORIDE 0.9 % IV SOLN
2000.0000 mg/m2 | INTRAVENOUS | Status: DC
  Administered 2014-11-12: 3800 mg via INTRAVENOUS
  Filled 2014-11-12: qty 76

## 2014-11-12 MED ORDER — SODIUM CHLORIDE 0.9 % IV SOLN
Freq: Once | INTRAVENOUS | Status: AC
  Administered 2014-11-12: 10:00:00 via INTRAVENOUS
  Filled 2014-11-12: qty 1000

## 2014-11-14 ENCOUNTER — Inpatient Hospital Stay: Payer: BC Managed Care – PPO | Attending: Family Medicine

## 2014-11-14 ENCOUNTER — Inpatient Hospital Stay: Payer: Medicare Other

## 2014-11-14 VITALS — BP 97/66 | HR 85 | Temp 96.2°F | Resp 19

## 2014-11-14 DIAGNOSIS — C787 Secondary malignant neoplasm of liver and intrahepatic bile duct: Secondary | ICD-10-CM | POA: Diagnosis not present

## 2014-11-14 DIAGNOSIS — Z8546 Personal history of malignant neoplasm of prostate: Secondary | ICD-10-CM | POA: Diagnosis not present

## 2014-11-14 DIAGNOSIS — Z85841 Personal history of malignant neoplasm of brain: Secondary | ICD-10-CM | POA: Diagnosis not present

## 2014-11-14 DIAGNOSIS — C187 Malignant neoplasm of sigmoid colon: Secondary | ICD-10-CM | POA: Diagnosis not present

## 2014-11-14 DIAGNOSIS — Z85118 Personal history of other malignant neoplasm of bronchus and lung: Secondary | ICD-10-CM | POA: Diagnosis not present

## 2014-11-14 DIAGNOSIS — Z95828 Presence of other vascular implants and grafts: Secondary | ICD-10-CM

## 2014-11-14 DIAGNOSIS — Z5111 Encounter for antineoplastic chemotherapy: Secondary | ICD-10-CM | POA: Diagnosis not present

## 2014-11-14 MED ORDER — HEPARIN SOD (PORK) LOCK FLUSH 100 UNIT/ML IV SOLN
500.0000 [IU] | Freq: Once | INTRAVENOUS | Status: AC
  Administered 2014-11-14: 500 [IU] via INTRAVENOUS

## 2014-11-14 MED ORDER — SODIUM CHLORIDE 0.9 % IJ SOLN
10.0000 mL | INTRAMUSCULAR | Status: DC | PRN
  Administered 2014-11-14: 10 mL via INTRAVENOUS
  Filled 2014-11-14: qty 10

## 2014-11-21 ENCOUNTER — Other Ambulatory Visit: Payer: Self-pay | Admitting: *Deleted

## 2014-11-21 DIAGNOSIS — C187 Malignant neoplasm of sigmoid colon: Secondary | ICD-10-CM

## 2014-11-26 ENCOUNTER — Inpatient Hospital Stay: Payer: BC Managed Care – PPO | Admitting: Family Medicine

## 2014-11-26 ENCOUNTER — Inpatient Hospital Stay: Payer: BC Managed Care – PPO

## 2014-12-03 ENCOUNTER — Inpatient Hospital Stay (HOSPITAL_BASED_OUTPATIENT_CLINIC_OR_DEPARTMENT_OTHER): Payer: Medicare Other | Admitting: Oncology

## 2014-12-03 ENCOUNTER — Encounter: Payer: Self-pay | Admitting: Oncology

## 2014-12-03 ENCOUNTER — Inpatient Hospital Stay: Payer: Medicare Other

## 2014-12-03 ENCOUNTER — Inpatient Hospital Stay: Payer: Medicare Other | Attending: Family Medicine

## 2014-12-03 VITALS — BP 129/76 | HR 76 | Temp 95.1°F | Resp 18

## 2014-12-03 VITALS — BP 128/78 | HR 75 | Temp 94.9°F | Wt 152.6 lb

## 2014-12-03 DIAGNOSIS — Z7952 Long term (current) use of systemic steroids: Secondary | ICD-10-CM | POA: Insufficient documentation

## 2014-12-03 DIAGNOSIS — L270 Generalized skin eruption due to drugs and medicaments taken internally: Secondary | ICD-10-CM

## 2014-12-03 DIAGNOSIS — Z87891 Personal history of nicotine dependence: Secondary | ICD-10-CM | POA: Insufficient documentation

## 2014-12-03 DIAGNOSIS — Z23 Encounter for immunization: Secondary | ICD-10-CM | POA: Diagnosis not present

## 2014-12-03 DIAGNOSIS — Z85118 Personal history of other malignant neoplasm of bronchus and lung: Secondary | ICD-10-CM

## 2014-12-03 DIAGNOSIS — Z9221 Personal history of antineoplastic chemotherapy: Secondary | ICD-10-CM | POA: Diagnosis not present

## 2014-12-03 DIAGNOSIS — C787 Secondary malignant neoplasm of liver and intrahepatic bile duct: Secondary | ICD-10-CM

## 2014-12-03 DIAGNOSIS — K1120 Sialoadenitis, unspecified: Secondary | ICD-10-CM | POA: Insufficient documentation

## 2014-12-03 DIAGNOSIS — Z79899 Other long term (current) drug therapy: Secondary | ICD-10-CM | POA: Insufficient documentation

## 2014-12-03 DIAGNOSIS — Z8546 Personal history of malignant neoplasm of prostate: Secondary | ICD-10-CM | POA: Diagnosis not present

## 2014-12-03 DIAGNOSIS — Z923 Personal history of irradiation: Secondary | ICD-10-CM

## 2014-12-03 DIAGNOSIS — C187 Malignant neoplasm of sigmoid colon: Secondary | ICD-10-CM

## 2014-12-03 DIAGNOSIS — Z85841 Personal history of malignant neoplasm of brain: Secondary | ICD-10-CM

## 2014-12-03 LAB — COMPREHENSIVE METABOLIC PANEL
ALBUMIN: 4 g/dL (ref 3.5–5.0)
ALK PHOS: 80 U/L (ref 38–126)
ALT: 20 U/L (ref 17–63)
AST: 31 U/L (ref 15–41)
Anion gap: 5 (ref 5–15)
BILIRUBIN TOTAL: 0.6 mg/dL (ref 0.3–1.2)
BUN: 12 mg/dL (ref 6–20)
CALCIUM: 9 mg/dL (ref 8.9–10.3)
CO2: 27 mmol/L (ref 22–32)
Chloride: 105 mmol/L (ref 101–111)
Creatinine, Ser: 0.78 mg/dL (ref 0.61–1.24)
GFR calc Af Amer: >60 mL/min (ref >60)
GFR calc non Af Amer: >60 mL/min (ref >60)
GLUCOSE: 84 mg/dL (ref 65–99)
Potassium: 3.8 mmol/L (ref 3.5–5.1)
SODIUM: 137 mmol/L (ref 135–145)
TOTAL PROTEIN: 7.2 g/dL (ref 6.5–8.1)

## 2014-12-03 LAB — CBC WITH DIFFERENTIAL/PLATELET
BASOS PCT: 0 %
Basophils Absolute: 0 10*3/uL (ref 0–0.1)
EOS PCT: 3 %
Eosinophils Absolute: 0.1 10*3/uL (ref 0–0.7)
HEMATOCRIT: 33.4 % — AB (ref 40.0–52.0)
HEMOGLOBIN: 11.3 g/dL — AB (ref 13.0–18.0)
LYMPHS ABS: 1.3 10*3/uL (ref 1.0–3.6)
LYMPHS PCT: 35 %
MCH: 32.8 pg (ref 26.0–34.0)
MCHC: 33.8 g/dL (ref 32.0–36.0)
MCV: 96.9 fL (ref 80.0–100.0)
MONO ABS: 0.7 10*3/uL (ref 0.2–1.0)
Monocytes Relative: 18 %
Neutro Abs: 1.6 10*3/uL (ref 1.4–6.5)
Neutrophils Relative %: 44 %
Platelets: 140 10*3/uL — ABNORMAL LOW (ref 150–440)
RBC: 3.45 MIL/uL — AB (ref 4.40–5.90)
RDW: 15.8 % — ABNORMAL HIGH (ref 11.5–14.5)
WBC: 3.8 10*3/uL (ref 3.8–10.6)

## 2014-12-03 LAB — MAGNESIUM: Magnesium: 1.8 mg/dL (ref 1.7–2.4)

## 2014-12-03 MED ORDER — LEUCOVORIN CALCIUM INJECTION 350 MG
750.0000 mg | Freq: Once | INTRAVENOUS | Status: AC
  Administered 2014-12-03: 750 mg via INTRAVENOUS
  Filled 2014-12-03: qty 25

## 2014-12-03 MED ORDER — DEXTROSE 5 % IV SOLN
Freq: Once | INTRAVENOUS | Status: AC
  Administered 2014-12-03: 13:00:00 via INTRAVENOUS
  Filled 2014-12-03: qty 1000

## 2014-12-03 MED ORDER — SODIUM CHLORIDE 0.9 % IV SOLN
2000.0000 mg/m2 | INTRAVENOUS | Status: DC
  Administered 2014-12-03: 3800 mg via INTRAVENOUS
  Filled 2014-12-03: qty 76

## 2014-12-03 MED ORDER — SODIUM CHLORIDE 0.9 % IJ SOLN
10.0000 mL | Freq: Once | INTRAMUSCULAR | Status: AC
  Administered 2014-12-03: 10 mL via INTRAVENOUS
  Filled 2014-12-03: qty 10

## 2014-12-03 MED ORDER — SODIUM CHLORIDE 0.9 % IV SOLN
Freq: Once | INTRAVENOUS | Status: AC
  Administered 2014-12-03: 11:00:00 via INTRAVENOUS
  Filled 2014-12-03: qty 4

## 2014-12-03 MED ORDER — HEPARIN SOD (PORK) LOCK FLUSH 100 UNIT/ML IV SOLN: 500.0000 [IU] | Freq: Once | INTRAVENOUS | Status: DC

## 2014-12-03 MED ORDER — SODIUM CHLORIDE 0.9 % IV SOLN
400.0000 mg | Freq: Once | INTRAVENOUS | Status: AC
  Administered 2014-12-03: 400 mg via INTRAVENOUS
  Filled 2014-12-03: qty 20

## 2014-12-03 MED ORDER — SODIUM CHLORIDE 0.9 % IV SOLN
Freq: Once | INTRAVENOUS | Status: AC
  Administered 2014-12-03: 11:00:00 via INTRAVENOUS
  Filled 2014-12-03: qty 1000

## 2014-12-03 MED ORDER — DEXTROSE 5 % IV SOLN
85.0000 mg/m2 | Freq: Once | INTRAVENOUS | Status: AC
  Administered 2014-12-03: 160 mg via INTRAVENOUS
  Filled 2014-12-03: qty 32

## 2014-12-03 MED ORDER — FLUOROURACIL CHEMO INJECTION 2.5 GM/50ML
350.0000 mg/m2 | Freq: Once | INTRAVENOUS | Status: AC
  Administered 2014-12-03: 650 mg via INTRAVENOUS
  Filled 2014-12-03: qty 13

## 2014-12-03 MED ORDER — INFLUENZA VAC SPLIT QUAD 0.5 ML IM SUSY
0.5000 mL | PREFILLED_SYRINGE | Freq: Once | INTRAMUSCULAR | Status: AC
  Administered 2014-12-03: 0.5 mL via INTRAMUSCULAR
  Filled 2014-12-03: qty 0.5

## 2014-12-04 ENCOUNTER — Encounter: Payer: Self-pay | Admitting: Oncology

## 2014-12-04 NOTE — Progress Notes (Signed)
[place] @ [facility] [phone]) [phone]  Fax:(336) [phone]     Jayant Kriz OB: 1940-08-05  MR#: [id number]  TIR#:[id number]  Patient Care Team: No Pcp Per Patient as PCP - General (General Practice) [name], MD (Oncology) [name], MD (General Surgery)  CHIEF COMPLAINT:  Chief Complaint  Patient presents with  . 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 MRI scan of brain shows the right parietal lobe lesion (T4, N2, M1 disease) September, 2014 stage IV 3. Stereotactic radiation therapy to the brain been arranged.  Patient was started on carboplatinum and VP-16. 4. Chest radiation started on December, 2014 5. Patient finished radiation to the chest in January of 2015.  Received total 6000 rads 6. Finished consolidation chemotherapy on March 14, 2013 7. PET scan shows progressive disease in the liver.  September, 2015 8. Started on carboplatinum and CPT-11 from November 14, 2013 9.cancer of the mid sigmoid colon.  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 patient is on FOLFOX and vectibix from April of 2016          04/24/2014 Initial Diagnosis Carcinoma of sigmoid colon     Oncology Flowsheet 09/24/2014 10/15/2014 10/15/2014 11/12/2014 11/12/2014 12/03/2014 12/03/2014  Day, Cycle   Day 1, Cycle 6   Day 1, Cycle 7   Day 1, Cycle 8    dexamethasone (DECADRON) IV   [ 10 mg ]   [ 10 mg ]   [ 10 mg ]    fluorouracil (ADRUCIL) IV 2,000 mg/m2 350 mg/m2 2,000 mg/m2 350 mg/m2 2,000 mg/m2 350 mg/m2 2,000 mg/m2  fosaprepitant (EMEND) IV - - - - - - -    leucovorin IV   750 mg   750 mg   750 mg    ondansetron (ZOFRAN) IV   [ 8 mg ]   [ 8 mg ]   [ 8 mg ]    oxaliplatin (ELOXATIN) IV   85 mg/m2   85 mg/m2   85 mg/m2    palonosetron (ALOXI) IV - - - - - - -  panitumumab (VECTIBIX) IV   400 mg   400 mg   400 mg      INTERVAL HISTORY:  Patient is here for ongoing evaluation and continuation of chemotherapy.  As developed rash on the face secondary to [place].  No chills.   He should not is here for ongoing evaluation and continuation of chemotherapy.  Tolerating treatment very well.  No tearing.  No numbness.  Appetite has been fairly good.  Last CT scan in June was showing excellent response to chemotherapy tumor markers are slowly declining.  Patient does not have tingling numbness.  Rash is improved.  ROS Gen. status: Alert oriented not any acute distress. HEENT: No headache.  No dizziness.  No soreness in the mouth.  Pain in the right side of the throat.  Has some difficulty swallowing Wisdom tooth from the left side of the mouth was removed.  Swelling and bleeding  No palpable masses in the neck Lungs: No cough.  No hemoptysis.  No chest pain.  No shortness of breath. Cardiac: No  chest pain.  No paroxysmal dyspnea.  No orthopnea.  No palpitation Abdomen: No abdominal pain.  No diarrhea.  No nausea.  No vomiting.  Appetite is stable.  No rectal bleeding. Skin: No ecchymosis.   rash.  Improving.  No itching. Neuro: No headache.  No dizziness.  No weakness in upper or lower extremity.  No evidence of tingling numbness.. Lower extremity no edema As per HPI. Otherwise, a complete review of systems is negatve.  PAST MEDICAL HISTORY: Past Medical History  Diagnosis Date  . Kidney stones 1975  . Lung cancer [facility]) 2014    Radiation therapy  . Liver cancer ([place])   . Colon cancer ([place]) 01/2014  . Prostate cancer [facility]) 2005    treated by Dr Eliberto Ivory  . Brain cancer ([place]) 01/2014    Chemotherapy and radiation treatment    PAST SURGICAL  HISTORY: Past Surgical History  Procedure Laterality Date  . Kidney stones  1975  . Prostate surgery  2005    partial removal due to cancer  . Colon surgery  05-03-14    Resection of Sigmoid Colon and liver biopsy  . Portacath placement  2014    FAMILY HISTORY Family History  Problem Relation Age of Onset  . Lung cancer Father   . Alzheimer's disease Mother        ADVANCED DIRECTIVES: Patient does have advanced health care directive   HEALTH MAINTENANCE: Social History  Substance Use Topics  . Smoking status: Former Smoker -- 35 years  . Smokeless tobacco: Never Used  . Alcohol Use: No      No Known Allergies  Current Outpatient Prescriptions  Medication Sig Dispense Refill  . doxycycline (VIBRA-TABS) 100 MG tablet Take 1 tablet (100 mg total) by mouth 2 (two) times daily. 60 tablet 3  . ibuprofen (ADVIL,MOTRIN) 200 MG tablet Take 200 mg by mouth every 6 (six) hours as needed.    . lidocaine-prilocaine (EMLA) cream Apply to affected area once 30 g 3  . oxyCODONE (OXY IR/ROXICODONE) 5 MG immediate release tablet Take 5 mg by mouth every 6 (six) hours as needed.     . predniSONE (DELTASONE) 10 MG tablet Take 1 tablet (10 mg total) by mouth daily with breakfast. 30 tablet 0  . PROVENTIL HFA 108 (90 BASE) MCG/ACT inhaler Inhale 90 mcg into the lungs every 6 (six) hours as needed.    . Triamcinolone Acetonide (TRIAMCINOLONE 0.1 % CREAM : EUCERIN) CREA Apply 1 application topically 2 (two) times daily. 1 each 3   No current facility-administered medications for this visit.   Facility-Administered Medications Ordered in Other Visits  Medication Dose Route Frequency Provider Last Rate Last Dose  . sodium chloride 0.9 % injection 10 mL  10 mL Intracatheter PRN [name], MD   10 mL at 07/10/14 1428    OBJECTIVE: Filed Vitals:   12/03/14 0931  BP: 128/78  Pulse: 75  Temp: 94.9 F (34.9 C)     Body mass index is 19.58 kg/(m^2).    ECOG FS:1 - Symptomatic but  completely ambulatory  Physical Exam  GENERAL:  Well developed, well nourished, sitting comfortably in the exam room in no acute distress. MENTAL STATUS:  Alert and oriented to person, place and time. HEAD:Normocephalic, atraumatic, face symmetric, no Cushingoid features. EYES:  .  Pupils equal round and reactive to light and accomodation.  No conjunctivitis or scleral icterus. ENT:  Oropharynx clear without lesion.  Tongue normal. Mucous membranes moist. . Patient has pain intermittently on  the right side of the throat has enlarged tonsils right is more enlarged than the left I do not see any ulcer or fungating mass RESPIRATORY:  Clear to auscultation without rales, wheezes or rhonchi. CARDIOVASCULAR:  Regular rate and rhythm without murmur, rub or gallop. BREAST:  Right breast without masses, skin changes or nipple discharge.  Left breast without masses, skin changes or nipple discharge. ABDOMEN:  Soft, non-tender, with active bowel sounds, and no hepatosplenomegaly.  No masses. BACK:  No CVA tenderness.  No tenderness on percussion of the back or rib cage. SKIN:  No rashes, ulcers or lesions. EXTREMITIES: No edema, no skin discoloration or tenderness.  No palpable cords. LYMPH NODES: No palpable cervical, supraclavicular, axillary or inguinal adenopathy  NEUROLOGICAL: Unremarkable. PSYCH:  Appropriate.   LAB RESULTS:    No results found for: SPEP, UPEP  Lab Results  Component Value Date   WBC 3.8 12/03/2014   NEUTROABS 1.6 12/03/2014   HGB 11.3* 12/03/2014   HCT 33.4* 12/03/2014   MCV 96.9 12/03/2014   PLT 140* 12/03/2014      Chemistry             STUDIES Component     Latest Ref Rng 05/20/2014 05/23/2014 07/22/2014 08/20/2014 09/10/2014  CEA     0.0 - 4.7 ng/mL 775.3 (H) 603.3 (H) 65.4 (H) 26.3 (H) 14.6 (H)   September, 2016                    Ref Range 4wk ago    CEA 0.0 - 4.7 ng/mL 10.4 (H)         ASSESSMENT:   stage IV undifferentiated carcinoma  (small cell) of lung There is no evidence of recurrent disease.2.rectal bleeding.  Colonoscopy revealed mid sigmoid colon cancer 2.  Carcinoma of colon metastases to liver stage IV disease k-ras wild type  Progressive decline on CEA suggesting good response to the chemotherapy Patient has been tolerating chemotherapy very well.  Without any significant side effect no evidence of neuropathy. 2.  Because of persistent pain on the right side of the throat will get ENT evaluation ENT evaluation reveals sialoadenitis.  Patient's pain is improved  PLAN:   I will also pressure is improved.  Proceed with chemotherapy Reevaluate patient in 2-3 weeks   Carcinoma of sigmoid colon   Staging form: Colon and Rectum, AJCC 7th Edition     Clinical: Stage IVB (yT3, N1a, M1b) - Marni Griffon, MD   12/04/2014 8:10 AM

## 2014-12-05 ENCOUNTER — Inpatient Hospital Stay: Payer: Medicare Other

## 2014-12-05 DIAGNOSIS — Z8546 Personal history of malignant neoplasm of prostate: Secondary | ICD-10-CM | POA: Diagnosis not present

## 2014-12-05 DIAGNOSIS — C801 Malignant (primary) neoplasm, unspecified: Secondary | ICD-10-CM

## 2014-12-05 DIAGNOSIS — Z85118 Personal history of other malignant neoplasm of bronchus and lung: Secondary | ICD-10-CM | POA: Diagnosis not present

## 2014-12-05 DIAGNOSIS — C187 Malignant neoplasm of sigmoid colon: Secondary | ICD-10-CM | POA: Diagnosis not present

## 2014-12-05 DIAGNOSIS — Z23 Encounter for immunization: Secondary | ICD-10-CM | POA: Diagnosis not present

## 2014-12-05 DIAGNOSIS — Z85841 Personal history of malignant neoplasm of brain: Secondary | ICD-10-CM | POA: Diagnosis not present

## 2014-12-05 DIAGNOSIS — C787 Secondary malignant neoplasm of liver and intrahepatic bile duct: Secondary | ICD-10-CM | POA: Diagnosis not present

## 2014-12-05 MED ORDER — HEPARIN SOD (PORK) LOCK FLUSH 100 UNIT/ML IV SOLN
500.0000 [IU] | Freq: Once | INTRAVENOUS | Status: AC
  Administered 2014-12-05: 500 [IU] via INTRAVENOUS
  Filled 2014-12-05: qty 5

## 2014-12-05 MED ORDER — SODIUM CHLORIDE 0.9 % IJ SOLN
10.0000 mL | INTRAMUSCULAR | Status: DC | PRN
  Administered 2014-12-05: 10 mL via INTRAVENOUS
  Filled 2014-12-05: qty 10

## 2014-12-11 DIAGNOSIS — C187 Malignant neoplasm of sigmoid colon: Secondary | ICD-10-CM

## 2014-12-11 NOTE — Progress Notes (Signed)
Tissue blocks received from pathology department.  I had requested the tissue as part of patient enrollment in research study [employer] MPR-1.  I received two tissue blocks, one with tumor tissue and one without tumor tissue.  I have submitted supporting documentation to [employer] regarding patient enrollment and will wait for [employer] to verify patient is eligible.  Once patient is deemed eligible for participation in study, I will ship tissue to [employer].

## 2014-12-24 ENCOUNTER — Inpatient Hospital Stay: Payer: Medicare Other | Attending: Oncology | Admitting: Oncology

## 2014-12-24 ENCOUNTER — Inpatient Hospital Stay: Payer: Medicare Other

## 2014-12-24 ENCOUNTER — Encounter: Payer: Self-pay | Admitting: Oncology

## 2014-12-24 VITALS — BP 143/83 | HR 70 | Temp 94.1°F | Wt 152.1 lb

## 2014-12-24 DIAGNOSIS — D759 Disease of blood and blood-forming organs, unspecified: Secondary | ICD-10-CM

## 2014-12-24 DIAGNOSIS — R1310 Dysphagia, unspecified: Secondary | ICD-10-CM | POA: Diagnosis not present

## 2014-12-24 DIAGNOSIS — C787 Secondary malignant neoplasm of liver and intrahepatic bile duct: Secondary | ICD-10-CM | POA: Insufficient documentation

## 2014-12-24 DIAGNOSIS — Z7952 Long term (current) use of systemic steroids: Secondary | ICD-10-CM | POA: Insufficient documentation

## 2014-12-24 DIAGNOSIS — Z5112 Encounter for antineoplastic immunotherapy: Secondary | ICD-10-CM | POA: Diagnosis not present

## 2014-12-24 DIAGNOSIS — Z87891 Personal history of nicotine dependence: Secondary | ICD-10-CM | POA: Diagnosis not present

## 2014-12-24 DIAGNOSIS — Z923 Personal history of irradiation: Secondary | ICD-10-CM | POA: Diagnosis not present

## 2014-12-24 DIAGNOSIS — C187 Malignant neoplasm of sigmoid colon: Secondary | ICD-10-CM | POA: Insufficient documentation

## 2014-12-24 DIAGNOSIS — Z85118 Personal history of other malignant neoplasm of bronchus and lung: Secondary | ICD-10-CM | POA: Diagnosis not present

## 2014-12-24 DIAGNOSIS — Z9889 Other specified postprocedural states: Secondary | ICD-10-CM | POA: Insufficient documentation

## 2014-12-24 DIAGNOSIS — T451X5S Adverse effect of antineoplastic and immunosuppressive drugs, sequela: Secondary | ICD-10-CM | POA: Insufficient documentation

## 2014-12-24 DIAGNOSIS — Z87442 Personal history of urinary calculi: Secondary | ICD-10-CM | POA: Insufficient documentation

## 2014-12-24 DIAGNOSIS — Z85841 Personal history of malignant neoplasm of brain: Secondary | ICD-10-CM

## 2014-12-24 DIAGNOSIS — Z79899 Other long term (current) drug therapy: Secondary | ICD-10-CM | POA: Insufficient documentation

## 2014-12-24 DIAGNOSIS — R21 Rash and other nonspecific skin eruption: Secondary | ICD-10-CM | POA: Diagnosis not present

## 2014-12-24 DIAGNOSIS — Z5111 Encounter for antineoplastic chemotherapy: Secondary | ICD-10-CM | POA: Insufficient documentation

## 2014-12-24 DIAGNOSIS — Z9221 Personal history of antineoplastic chemotherapy: Secondary | ICD-10-CM | POA: Diagnosis not present

## 2014-12-24 DIAGNOSIS — Z8546 Personal history of malignant neoplasm of prostate: Secondary | ICD-10-CM | POA: Diagnosis not present

## 2014-12-24 DIAGNOSIS — K1120 Sialoadenitis, unspecified: Secondary | ICD-10-CM | POA: Diagnosis not present

## 2014-12-24 DIAGNOSIS — K0889 Other specified disorders of teeth and supporting structures: Secondary | ICD-10-CM | POA: Diagnosis not present

## 2014-12-24 LAB — COMPREHENSIVE METABOLIC PANEL
ALK PHOS: 80 U/L (ref 38–126)
ALT: 23 U/L (ref 17–63)
AST: 34 U/L (ref 15–41)
Albumin: 4 g/dL (ref 3.5–5.0)
Anion gap: 6 (ref 5–15)
BUN: 10 mg/dL (ref 6–20)
CALCIUM: 9.3 mg/dL (ref 8.9–10.3)
CO2: 27 mmol/L (ref 22–32)
CREATININE: 0.76 mg/dL (ref 0.61–1.24)
Chloride: 105 mmol/L (ref 101–111)
Glucose, Bld: 103 mg/dL — ABNORMAL HIGH (ref 65–99)
Potassium: 3.5 mmol/L (ref 3.5–5.1)
Sodium: 138 mmol/L (ref 135–145)
TOTAL PROTEIN: 7 g/dL (ref 6.5–8.1)
Total Bilirubin: 0.5 mg/dL (ref 0.3–1.2)

## 2014-12-24 LAB — CBC WITH DIFFERENTIAL/PLATELET
Basophils Absolute: 0 10*3/uL (ref 0–0.1)
Basophils Relative: 0 %
EOS PCT: 4 %
Eosinophils Absolute: 0.1 10*3/uL (ref 0–0.7)
HCT: 32.1 % — ABNORMAL LOW (ref 40.0–52.0)
HEMOGLOBIN: 10.9 g/dL — AB (ref 13.0–18.0)
LYMPHS ABS: 1 10*3/uL (ref 1.0–3.6)
LYMPHS PCT: 35 %
MCH: 32.6 pg (ref 26.0–34.0)
MCHC: 34 g/dL (ref 32.0–36.0)
MCV: 95.9 fL (ref 80.0–100.0)
MONOS PCT: 25 %
Monocytes Absolute: 0.7 10*3/uL (ref 0.2–1.0)
Neutro Abs: 1.1 10*3/uL — ABNORMAL LOW (ref 1.4–6.5)
Neutrophils Relative %: 36 %
PLATELETS: 126 10*3/uL — AB (ref 150–440)
RBC: 3.34 MIL/uL — AB (ref 4.40–5.90)
RDW: 15.9 % — ABNORMAL HIGH (ref 11.5–14.5)
WBC: 3 10*3/uL — AB (ref 3.8–10.6)

## 2014-12-24 LAB — MAGNESIUM: MAGNESIUM: 1.9 mg/dL (ref 1.7–2.4)

## 2014-12-24 MED ORDER — SODIUM CHLORIDE 0.9 % IV SOLN
400.0000 mg | Freq: Once | INTRAVENOUS | Status: AC
  Administered 2014-12-24: 400 mg via INTRAVENOUS
  Filled 2014-12-24: qty 20

## 2014-12-24 MED ORDER — SODIUM CHLORIDE 0.9 % IJ SOLN
10.0000 mL | Freq: Once | INTRAMUSCULAR | Status: AC
  Administered 2014-12-24: 10 mL via INTRAVENOUS
  Filled 2014-12-24: qty 10

## 2014-12-24 MED ORDER — DEXTROSE 5 % IV SOLN
75.0000 mg/m2 | Freq: Once | INTRAVENOUS | Status: AC
  Administered 2014-12-24: 140 mg via INTRAVENOUS
  Filled 2014-12-24: qty 20

## 2014-12-24 MED ORDER — SODIUM CHLORIDE 0.9 % IV SOLN
Freq: Once | INTRAVENOUS | Status: AC
  Administered 2014-12-24: 10:00:00 via INTRAVENOUS
  Filled 2014-12-24: qty 1000

## 2014-12-24 MED ORDER — SODIUM CHLORIDE 0.9 % IV SOLN
1850.0000 mg/m2 | INTRAVENOUS | Status: DC
  Administered 2014-12-24: 3500 mg via INTRAVENOUS
  Filled 2014-12-24: qty 70

## 2014-12-24 MED ORDER — DEXTROSE 5 % IV SOLN
Freq: Once | INTRAVENOUS | Status: AC
  Administered 2014-12-24: 12:00:00 via INTRAVENOUS
  Filled 2014-12-24: qty 1000

## 2014-12-24 MED ORDER — FLUOROURACIL CHEMO INJECTION 2.5 GM/50ML
350.0000 mg/m2 | Freq: Once | INTRAVENOUS | Status: AC
  Administered 2014-12-24: 650 mg via INTRAVENOUS
  Filled 2014-12-24: qty 13

## 2014-12-24 MED ORDER — SODIUM CHLORIDE 0.9 % IV SOLN
Freq: Once | INTRAVENOUS | Status: AC
  Administered 2014-12-24: 10:00:00 via INTRAVENOUS
  Filled 2014-12-24: qty 4

## 2014-12-24 MED ORDER — DEXTROSE 5 % IV SOLN
750.0000 mg | Freq: Once | INTRAVENOUS | Status: AC
  Administered 2014-12-24: 750 mg via INTRAVENOUS
  Filled 2014-12-24: qty 25

## 2014-12-24 NOTE — Progress Notes (Signed)
ANC: 1100. MD, Dr. Oliva Bustard, notified via telephone. Per MD, Dr. Oliva Bustard, order: proceed with chemotherapy treatment today.

## 2014-12-24 NOTE — Progress Notes (Signed)
[place] @ [facility] [phone]) [phone]  [phone]) [phone]     Mckenna Boruff OB: Apr 17, 1940  MR#: [id number]  QZE#:[id number]  Patient Care Team: No Pcp Per Patient as PCP - General (General Practice) [name], MD (Oncology) [name], MD (General Surgery)  CHIEF COMPLAINT:  Chief Complaint  Patient presents with  . 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 MRI scan of brain shows the right parietal lobe lesion (T4, N2, M1 disease) September, 2014 stage IV 3. Stereotactic radiation therapy to the brain been arranged.  Patient was started on carboplatinum and VP-16. 4. Chest radiation started on December, 2014 5. Patient finished radiation to the chest in January of 2015.  Received total 6000 rads 6. Finished consolidation chemotherapy on March 14, 2013 7. PET scan shows progressive disease in the liver.  September, 2015 8. Started on carboplatinum and CPT-11 from November 14, 2013 9.cancer of the mid sigmoid colon.  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 patient is on FOLFOX and vectibix from April of 2016          04/24/2014 Initial Diagnosis Carcinoma of sigmoid colon     Oncology Flowsheet 09/24/2014 10/15/2014 10/15/2014 11/12/2014 11/12/2014 12/03/2014 12/03/2014  Day, Cycle   Day 1, Cycle 6   Day 1, Cycle 7   Day 1, Cycle 8    dexamethasone (DECADRON) IV   [ 10 mg ]   [ 10 mg ]   [ 10 mg ]    fluorouracil (ADRUCIL) IV 2,000 mg/m2 350 mg/m2 2,000 mg/m2 350 mg/m2 2,000 mg/m2 350 mg/m2 2,000 mg/m2  fosaprepitant (EMEND) IV - - - - - - -    leucovorin IV   750 mg   750 mg   750 mg    ondansetron (ZOFRAN) IV   [ 8 mg ]   [ 8 mg ]   [ 8 mg ]    oxaliplatin (ELOXATIN) IV   85 mg/m2   85 mg/m2   85 mg/m2    palonosetron (ALOXI) IV - - - - - - -  panitumumab (VECTIBIX) IV   400 mg   400 mg   400 mg      INTERVAL HISTORY:  Patient is here for ongoing evaluation and continuation of chemotherapy.  As developed rash on the face secondary to [place].  No chills.  Patient is slightly neutropenic but without any fever.  No chills.  No fever.  Rash has improved. Taking doxycycline.  No nausea no vomiting no diarrhea.  No abdominal pain.  Good performance status  ROS Gen. status: Alert oriented not any acute distress. HEENT: No headache.  No dizziness.  No soreness in the mouth.  Pain in the right side of the throat.  Has some difficulty swallowing Wisdom tooth from the left side of the mouth was removed.  Swelling and bleeding  No palpable masses in the neck Lungs: No cough.  No hemoptysis.  No chest pain.  No shortness of breath. Cardiac: No chest pain.  No paroxysmal dyspnea.  No orthopnea.  No palpitation Abdomen: No abdominal pain.  No diarrhea.  No nausea.  No vomiting.  Appetite is stable.  No rectal bleeding. Skin: No ecchymosis.   rash.  Improving.  No itching. Neuro: No headache.  No dizziness.  No weakness in upper or lower extremity.  No evidence of tingling numbness.. Lower extremity no edema As per HPI. Otherwise, a complete review of systems is negatve.  PAST MEDICAL HISTORY: Past Medical History  Diagnosis Date  . Kidney stones 1975  . Lung cancer [facility]) 2014    Radiation therapy  . Liver cancer ([place])   . Colon cancer ([place]) 01/2014  . Prostate cancer [facility]) 2005    treated by Dr Eliberto Ivory  . Brain cancer ([place]) 01/2014    Chemotherapy and radiation treatment    PAST SURGICAL HISTORY: Past Surgical History  Procedure Laterality Date  . Kidney stones  1975  . Prostate surgery  2005    partial removal due to cancer   . Colon surgery  05-03-14    Resection of Sigmoid Colon and liver biopsy  . Portacath placement  2014    FAMILY HISTORY Family History  Problem Relation Age of Onset  . Lung cancer Father   . Alzheimer's disease Mother        ADVANCED DIRECTIVES: Patient does have advanced health care directive   HEALTH MAINTENANCE: Social History  Substance Use Topics  . Smoking status: Former Smoker -- 35 years  . Smokeless tobacco: Never Used  . Alcohol Use: No      No Known Allergies  Current Outpatient Prescriptions  Medication Sig Dispense Refill  . doxycycline (VIBRA-TABS) 100 MG tablet Take 1 tablet (100 mg total) by mouth 2 (two) times daily. 60 tablet 3  . ibuprofen (ADVIL,MOTRIN) 200 MG tablet Take 200 mg by mouth every 6 (six) hours as needed.    . lidocaine-prilocaine (EMLA) cream Apply to affected area once 30 g 3  . oxyCODONE (OXY IR/ROXICODONE) 5 MG immediate release tablet Take 5 mg by mouth every 6 (six) hours as needed.     . predniSONE (DELTASONE) 10 MG tablet Take 1 tablet (10 mg total) by mouth daily with breakfast. 30 tablet 0  . PROVENTIL HFA 108 (90 BASE) MCG/ACT inhaler Inhale 90 mcg into the lungs every 6 (six) hours as needed.    . Triamcinolone Acetonide (TRIAMCINOLONE 0.1 % CREAM : EUCERIN) CREA Apply 1 application topically 2 (two) times daily. 1 each 3   No current facility-administered medications for this visit.   Facility-Administered Medications Ordered in Other Visits  Medication Dose Route Frequency Provider Last Rate Last Dose  . sodium chloride 0.9 % injection 10 mL  10 mL Intracatheter PRN [name], MD   10 mL at 07/10/14 1428    OBJECTIVE: Filed Vitals:   12/24/14 0905  BP: 143/83  Pulse: 70  Temp: 94.1 F (34.5 C)     Body mass index is 19.52 kg/(m^2).    ECOG FS:1 - Symptomatic but completely ambulatory  Physical Exam  GENERAL:  Well developed, well nourished, sitting comfortably in the exam room in no acute distress. MENTAL  STATUS:  Alert and oriented to person, place and time. HEAD:Normocephalic, atraumatic, face symmetric, no Cushingoid features. EYES:  .  Pupils equal round and reactive to light and accomodation.  No conjunctivitis or scleral icterus. ENT:  Oropharynx clear without lesion.  Tongue normal. Mucous membranes moist. . Patient has pain intermittently on the right side of the throat has enlarged tonsils right is more enlarged than the left I do not see any ulcer or  fungating mass RESPIRATORY:  Clear to auscultation without rales, wheezes or rhonchi. CARDIOVASCULAR:  Regular rate and rhythm without murmur, rub or gallop. BREAST:  Right breast without masses, skin changes or nipple discharge.  Left breast without masses, skin changes or nipple discharge. ABDOMEN:  Soft, non-tender, with active bowel sounds, and no hepatosplenomegaly.  No masses. BACK:  No CVA tenderness.  No tenderness on percussion of the back or rib cage. SKIN:  No rashes, ulcers or lesions. EXTREMITIES: No edema, no skin discoloration or tenderness.  No palpable cords. LYMPH NODES: No palpable cervical, supraclavicular, axillary or inguinal adenopathy  NEUROLOGICAL: Unremarkable. PSYCH:  Appropriate.   LAB RESULTS:    No results found for: SPEP, UPEP  Lab Results  Component Value Date   WBC 3.0* 12/24/2014   NEUTROABS 1.1* 12/24/2014   HGB 10.9* 12/24/2014   HCT 32.1* 12/24/2014   MCV 95.9 12/24/2014   PLT 126* 12/24/2014      Chemistry             STUDIES Component     Latest Ref Rng 05/20/2014 05/23/2014 07/22/2014 08/20/2014 09/10/2014  CEA     0.0 - 4.7 ng/mL 775.3 (H) 603.3 (H) 65.4 (H) 26.3 (H) 14.6 (H)   September, 2016                    Ref Range 4wk ago    CEA 0.0 - 4.7 ng/mL 10.4 (H)         ASSESSMENT:   stage IV undifferentiated carcinoma (small cell) of lung There is no evidence of recurrent disease.2.rectal bleeding.  Colonoscopy revealed mid sigmoid colon cancer 2.  Carcinoma of  colon metastases to liver stage IV disease k-ras wild type  Progressive decline on CEA suggesting good response to the chemotherapy Patient has been tolerating chemotherapy very well.  Without any significant side effect no evidence of neuropathy. ENT evaluation reveals sialoadenitis Patient is mildly myelo suppressed without any fever.  Neutrophil count is 1100 Will proceed with chemotherapy with 10% reduction in her dose. Patient also desires some pneumonia shot which will be considered during next visit  PLAN:   I will also pressure is improved.  Proceed with chemotherapy Reevaluate patient in 2-3 weeks   Carcinoma of sigmoid colon   Staging form: Colon and Rectum, AJCC 7th Edition     Clinical: Stage IVB (yT3, N1a, M1b) - Marni Griffon, MD   12/24/2014 9:37 AM

## 2014-12-25 LAB — CEA: CEA: 6.7 ng/mL — AB (ref 0.0–4.7)

## 2014-12-26 ENCOUNTER — Inpatient Hospital Stay: Payer: Medicare Other

## 2014-12-26 DIAGNOSIS — T451X5S Adverse effect of antineoplastic and immunosuppressive drugs, sequela: Secondary | ICD-10-CM | POA: Diagnosis not present

## 2014-12-26 DIAGNOSIS — D759 Disease of blood and blood-forming organs, unspecified: Secondary | ICD-10-CM | POA: Diagnosis not present

## 2014-12-26 DIAGNOSIS — C187 Malignant neoplasm of sigmoid colon: Secondary | ICD-10-CM | POA: Diagnosis not present

## 2014-12-26 DIAGNOSIS — C787 Secondary malignant neoplasm of liver and intrahepatic bile duct: Secondary | ICD-10-CM | POA: Diagnosis not present

## 2014-12-26 DIAGNOSIS — Z5112 Encounter for antineoplastic immunotherapy: Secondary | ICD-10-CM | POA: Diagnosis not present

## 2014-12-26 DIAGNOSIS — Z5111 Encounter for antineoplastic chemotherapy: Secondary | ICD-10-CM | POA: Diagnosis not present

## 2014-12-26 DIAGNOSIS — C801 Malignant (primary) neoplasm, unspecified: Secondary | ICD-10-CM

## 2014-12-26 MED ORDER — HEPARIN SOD (PORK) LOCK FLUSH 100 UNIT/ML IV SOLN
500.0000 [IU] | Freq: Once | INTRAVENOUS | Status: AC
  Administered 2014-12-26: 500 [IU] via INTRAVENOUS

## 2014-12-26 MED ORDER — SODIUM CHLORIDE 0.9 % IJ SOLN
10.0000 mL | INTRAMUSCULAR | Status: DC | PRN
  Administered 2014-12-26: 10 mL via INTRAVENOUS
  Filled 2014-12-26: qty 10

## 2014-12-26 MED ORDER — HEPARIN SOD (PORK) LOCK FLUSH 100 UNIT/ML IV SOLN
INTRAVENOUS | Status: AC
  Filled 2014-12-26: qty 5

## 2015-01-14 ENCOUNTER — Inpatient Hospital Stay (HOSPITAL_BASED_OUTPATIENT_CLINIC_OR_DEPARTMENT_OTHER): Payer: Medicare Other | Admitting: Oncology

## 2015-01-14 ENCOUNTER — Encounter: Payer: Self-pay | Admitting: Oncology

## 2015-01-14 ENCOUNTER — Inpatient Hospital Stay: Payer: Medicare Other

## 2015-01-14 VITALS — BP 128/88 | HR 111 | Temp 94.6°F | Wt 148.6 lb

## 2015-01-14 DIAGNOSIS — D759 Disease of blood and blood-forming organs, unspecified: Secondary | ICD-10-CM | POA: Diagnosis not present

## 2015-01-14 DIAGNOSIS — T451X5S Adverse effect of antineoplastic and immunosuppressive drugs, sequela: Secondary | ICD-10-CM | POA: Diagnosis not present

## 2015-01-14 DIAGNOSIS — Z85118 Personal history of other malignant neoplasm of bronchus and lung: Secondary | ICD-10-CM

## 2015-01-14 DIAGNOSIS — Z923 Personal history of irradiation: Secondary | ICD-10-CM

## 2015-01-14 DIAGNOSIS — Z85841 Personal history of malignant neoplasm of brain: Secondary | ICD-10-CM

## 2015-01-14 DIAGNOSIS — R21 Rash and other nonspecific skin eruption: Secondary | ICD-10-CM

## 2015-01-14 DIAGNOSIS — K1120 Sialoadenitis, unspecified: Secondary | ICD-10-CM

## 2015-01-14 DIAGNOSIS — Z5112 Encounter for antineoplastic immunotherapy: Secondary | ICD-10-CM | POA: Diagnosis not present

## 2015-01-14 DIAGNOSIS — R1310 Dysphagia, unspecified: Secondary | ICD-10-CM

## 2015-01-14 DIAGNOSIS — C187 Malignant neoplasm of sigmoid colon: Secondary | ICD-10-CM

## 2015-01-14 DIAGNOSIS — Z5111 Encounter for antineoplastic chemotherapy: Secondary | ICD-10-CM | POA: Diagnosis not present

## 2015-01-14 DIAGNOSIS — C787 Secondary malignant neoplasm of liver and intrahepatic bile duct: Secondary | ICD-10-CM | POA: Diagnosis not present

## 2015-01-14 DIAGNOSIS — Z9889 Other specified postprocedural states: Secondary | ICD-10-CM

## 2015-01-14 DIAGNOSIS — Z8546 Personal history of malignant neoplasm of prostate: Secondary | ICD-10-CM

## 2015-01-14 DIAGNOSIS — K0889 Other specified disorders of teeth and supporting structures: Secondary | ICD-10-CM

## 2015-01-14 DIAGNOSIS — Z9221 Personal history of antineoplastic chemotherapy: Secondary | ICD-10-CM

## 2015-01-14 LAB — COMPREHENSIVE METABOLIC PANEL
ALBUMIN: 4.1 g/dL (ref 3.5–5.0)
ALK PHOS: 89 U/L (ref 38–126)
ALT: 21 U/L (ref 17–63)
AST: 35 U/L (ref 15–41)
Anion gap: 9 (ref 5–15)
BILIRUBIN TOTAL: 0.7 mg/dL (ref 0.3–1.2)
BUN: 11 mg/dL (ref 6–20)
CALCIUM: 9.4 mg/dL (ref 8.9–10.3)
CO2: 25 mmol/L (ref 22–32)
Chloride: 104 mmol/L (ref 101–111)
Creatinine, Ser: 0.79 mg/dL (ref 0.61–1.24)
GFR calc Af Amer: >60 mL/min (ref >60)
GFR calc non Af Amer: >60 mL/min (ref >60)
GLUCOSE: 96 mg/dL (ref 65–99)
POTASSIUM: 3.8 mmol/L (ref 3.5–5.1)
SODIUM: 138 mmol/L (ref 135–145)
TOTAL PROTEIN: 7.6 g/dL (ref 6.5–8.1)

## 2015-01-14 LAB — CBC WITH DIFFERENTIAL/PLATELET
BASOS ABS: 0 10*3/uL (ref 0–0.1)
BASOS PCT: 0 %
EOS ABS: 0.1 10*3/uL (ref 0–0.7)
Eosinophils Relative: 2 %
HEMATOCRIT: 33.7 % — AB (ref 40.0–52.0)
HEMOGLOBIN: 11.2 g/dL — AB (ref 13.0–18.0)
Lymphocytes Relative: 28 %
Lymphs Abs: 1.2 10*3/uL (ref 1.0–3.6)
MCH: 31.8 pg (ref 26.0–34.0)
MCHC: 33.3 g/dL (ref 32.0–36.0)
MCV: 95.5 fL (ref 80.0–100.0)
Monocytes Absolute: 0.8 10*3/uL (ref 0.2–1.0)
Monocytes Relative: 19 %
NEUTROS ABS: 2.2 10*3/uL (ref 1.4–6.5)
Neutrophils Relative %: 51 %
Platelets: 166 10*3/uL (ref 150–440)
RBC: 3.53 MIL/uL — ABNORMAL LOW (ref 4.40–5.90)
RDW: 15.7 % — ABNORMAL HIGH (ref 11.5–14.5)
WBC: 4.3 10*3/uL (ref 3.8–10.6)

## 2015-01-14 LAB — MAGNESIUM: Magnesium: 1.9 mg/dL (ref 1.7–2.4)

## 2015-01-14 MED ORDER — SODIUM CHLORIDE 0.9 % IV SOLN
Freq: Once | INTRAVENOUS | Status: AC
  Administered 2015-01-14: 10:00:00 via INTRAVENOUS
  Filled 2015-01-14: qty 1000

## 2015-01-14 MED ORDER — PNEUMOCOCCAL VAC POLYVALENT 25 MCG/0.5ML IJ INJ
0.5000 mL | INJECTION | Freq: Once | INTRAMUSCULAR | Status: DC
  Filled 2015-01-14: qty 0.5

## 2015-01-14 MED ORDER — FLUOROURACIL CHEMO INJECTION 5 GM/100ML
1850.0000 mg/m2 | INTRAVENOUS | Status: DC
  Administered 2015-01-14: 3500 mg via INTRAVENOUS
  Filled 2015-01-14: qty 70

## 2015-01-14 MED ORDER — OXALIPLATIN CHEMO INJECTION 100 MG/20ML
75.0000 mg/m2 | Freq: Once | INTRAVENOUS | Status: AC
  Administered 2015-01-14: 140 mg via INTRAVENOUS
  Filled 2015-01-14: qty 20

## 2015-01-14 MED ORDER — PNEUMOCOCCAL VAC POLYVALENT 25 MCG/0.5ML IJ INJ: 0.5000 mL | INJECTION | Freq: Once | INTRAMUSCULAR | Status: DC

## 2015-01-14 MED ORDER — DEXTROSE 5 % IV SOLN
Freq: Once | INTRAVENOUS | Status: AC
  Administered 2015-01-14: 12:00:00 via INTRAVENOUS
  Filled 2015-01-14: qty 1000

## 2015-01-14 MED ORDER — DOXYCYCLINE HYCLATE 100 MG PO TABS: 100.0000 mg | ORAL_TABLET | Freq: Two times a day (BID) | ORAL | Status: DC

## 2015-01-14 MED ORDER — SODIUM CHLORIDE 0.9 % IJ SOLN
10.0000 mL | Freq: Once | INTRAMUSCULAR | Status: AC
  Administered 2015-01-14: 10 mL via INTRAVENOUS
  Filled 2015-01-14: qty 10

## 2015-01-14 MED ORDER — SODIUM CHLORIDE 0.9 % IV SOLN
400.0000 mg | Freq: Once | INTRAVENOUS | Status: AC
  Administered 2015-01-14: 400 mg via INTRAVENOUS
  Filled 2015-01-14: qty 20

## 2015-01-14 MED ORDER — SODIUM CHLORIDE 0.9 % IV SOLN
Freq: Once | INTRAVENOUS | Status: AC
  Administered 2015-01-14: 11:00:00 via INTRAVENOUS
  Filled 2015-01-14: qty 4

## 2015-01-14 MED ORDER — LEUCOVORIN CALCIUM INJECTION 350 MG
750.0000 mg | Freq: Once | INTRAVENOUS | Status: AC
  Administered 2015-01-14: 750 mg via INTRAVENOUS
  Filled 2015-01-14: qty 25

## 2015-01-14 MED ORDER — FLUOROURACIL CHEMO INJECTION 2.5 GM/50ML
350.0000 mg/m2 | Freq: Once | INTRAVENOUS | Status: AC
  Administered 2015-01-14: 650 mg via INTRAVENOUS
  Filled 2015-01-14: qty 13

## 2015-01-14 MED ORDER — HEPARIN SOD (PORK) LOCK FLUSH 100 UNIT/ML IV SOLN
500.0000 [IU] | Freq: Once | INTRAVENOUS | Status: DC | PRN
  Filled 2015-01-14: qty 5

## 2015-01-14 NOTE — Progress Notes (Signed)
[place] @ [facility] Telephone:(336) [phone]  Fax:(336) [phone]     Leston Schueller OB: 12-Mar-1940  MR#: [id number]  POE#:[id number]  Patient Care Team: No Pcp Per Patient as PCP - General (General Practice) [name], MD (Oncology) [name], MD (General Surgery)  CHIEF COMPLAINT:  Chief Complaint  Patient presents with  . 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 MRI scan of brain shows the right parietal lobe lesion (T4, N2, M1 disease) September, 2014 stage IV 3. Stereotactic radiation therapy to the brain been arranged.  Patient was started on carboplatinum and VP-16. 4. Chest radiation started on December, 2014 5. Patient finished radiation to the chest in January of 2015.  Received total 6000 rads 6. Finished consolidation chemotherapy on March 14, 2013 7. PET scan shows progressive disease in the liver.  September, 2015 8. Started on carboplatinum and CPT-11 from November 14, 2013 9.cancer of the mid sigmoid colon.  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 patient is on FOLFOX and vectibix from April of 2016          04/24/2014 Initial Diagnosis Carcinoma of sigmoid colon     Oncology Flowsheet 10/15/2014 11/12/2014 11/12/2014 12/03/2014 12/03/2014 12/24/2014 12/24/2014  Day, Cycle   Day 1, Cycle 7   Day 1, Cycle 8   Day 1, 9    dexamethasone (DECADRON) IV   [ 10 mg ]   [ 10 mg ]   [ 10 mg ]    fluorouracil (ADRUCIL) IV 2,000 mg/m2 350 mg/m2 2,000 mg/m2 350 mg/m2 2,000 mg/m2 350 mg/m2 1,850 mg/m2  fosaprepitant (EMEND) IV - - - - - - -    leucovorin IV   750 mg   750 mg   750 mg    ondansetron (ZOFRAN) IV   [ 8 mg ]   [ 8 mg ]   [ 8 mg ]    oxaliplatin (ELOXATIN) IV   85 mg/m2   85 mg/m2   75 mg/m2    palonosetron (ALOXI) IV - - - - - - -  panitumumab (VECTIBIX) IV   400 mg   400 mg   400 mg      INTERVAL HISTORY:  Patient is here for ongoing evaluation and continuation of chemotherapy.  As developed rash on the face secondary to [place].  No chills.  Patient is slightly neutropenic but without any fever.  No chills.  No fever.  Rash has improved. Taking doxycycline.  No nausea no vomiting no diarrhea.  No abdominal pain.  Good performance status Patient has been doing fairly good tolerating treatment very well without any significant nausea vomiting diarrhea. Patient had been on the does reduce because of myelosuppression.  And treatment is every 3 weeks apart Rash on the face and skin is dry  ROS Gen. status: Alert oriented not any acute distress. HEENT: No headache.  No dizziness.  No soreness in the mouth.  Pain in the right side of the throat.  Has some difficulty swallowing Wisdom tooth from the left side of the mouth was removed.  Swelling and bleeding  No palpable masses in  the neck Lungs: No cough.  No hemoptysis.  No chest pain.  No shortness of breath. Cardiac: No chest pain.  No paroxysmal dyspnea.  No orthopnea.  No palpitation Abdomen: No abdominal pain.  No diarrhea.  No nausea.  No vomiting.  Appetite is stable.  No rectal bleeding. Skin: No ecchymosis.   rash.  Improving.  No itching.  Maculopapular rash on the face Neuro: No headache.  No dizziness.  No weakness in upper or lower extremity.  No evidence of tingling numbness.. Lower extremity no edema As per HPI. Otherwise, a complete review of systems is negatve.  PAST MEDICAL HISTORY: Past Medical History  Diagnosis Date  . Kidney stones 1975  . Lung cancer [facility]) 2014    Radiation therapy  . Liver cancer ([place])   . Colon cancer ([place]) 01/2014  .  Prostate cancer [facility]) 2005    treated by Dr Eliberto Ivory  . Brain cancer ([place]) 01/2014    Chemotherapy and radiation treatment    PAST SURGICAL HISTORY: Past Surgical History  Procedure Laterality Date  . Kidney stones  1975  . Prostate surgery  2005    partial removal due to cancer  . Colon surgery  05-03-14    Resection of Sigmoid Colon and liver biopsy  . Portacath placement  2014    FAMILY HISTORY Family History  Problem Relation Age of Onset  . Lung cancer Father   . Alzheimer's disease Mother        ADVANCED DIRECTIVES: Patient does have advanced health care directive   HEALTH MAINTENANCE: Social History  Substance Use Topics  . Smoking status: Former Smoker -- 35 years  . Smokeless tobacco: Never Used  . Alcohol Use: No      No Known Allergies  Current Outpatient Prescriptions  Medication Sig Dispense Refill  . doxycycline (VIBRA-TABS) 100 MG tablet Take 1 tablet (100 mg total) by mouth 2 (two) times daily. 60 tablet 3  . ibuprofen (ADVIL,MOTRIN) 200 MG tablet Take 200 mg by mouth every 6 (six) hours as needed.    . lidocaine-prilocaine (EMLA) cream Apply to affected area once 30 g 3  . oxyCODONE (OXY IR/ROXICODONE) 5 MG immediate release tablet Take 5 mg by mouth every 6 (six) hours as needed.     . predniSONE (DELTASONE) 10 MG tablet Take 1 tablet (10 mg total) by mouth daily with breakfast. 30 tablet 0  . PROVENTIL HFA 108 (90 BASE) MCG/ACT inhaler Inhale 90 mcg into the lungs every 6 (six) hours as needed.    . Triamcinolone Acetonide (TRIAMCINOLONE 0.1 % CREAM : EUCERIN) CREA Apply 1 application topically 2 (two) times daily. 1 each 3   No current facility-administered medications for this visit.   Facility-Administered Medications Ordered in Other Visits  Medication Dose Route Frequency Provider Last Rate Last Dose  . sodium chloride 0.9 % injection 10 mL  10 mL Intracatheter PRN [name], MD   10 mL at 07/10/14 1428    OBJECTIVE: Filed Vitals:    01/14/15 0843  BP: 128/88  Pulse: 111  Temp: 94.6 F (34.8 C)     Body mass index is 19.07 kg/(m^2).    ECOG FS:1 - Symptomatic but completely ambulatory  Physical Exam  GENERAL:  Well developed, well nourished, sitting comfortably in the exam room in no acute distress. MENTAL STATUS:  Alert and oriented to person, place and time. HEAD:Normocephalic, atraumatic, face symmetric, no Cushingoid features. EYES:  .  Pupils equal round and reactive to light and  accomodation.  No conjunctivitis or scleral icterus. ENT:  Oropharynx clear without lesion.  Tongue normal. Mucous membranes moist. . Patient has pain intermittently on the right side of the throat has enlarged tonsils right is more enlarged than the left I do not see any ulcer or fungating mass RESPIRATORY:  Clear to auscultation without rales, wheezes or rhonchi. CARDIOVASCULAR:  Regular rate and rhythm without murmur, rub or gallop. BREAST:  Right breast without masses, skin changes or nipple discharge.  Left breast without masses, skin changes or nipple discharge. ABDOMEN:  Soft, non-tender, with active bowel sounds, and no hepatosplenomegaly.  No masses. BACK:  No CVA tenderness.  No tenderness on percussion of the back or rib cage. SKIN: Dry scaly skin and the rash on the face. EXTREMITIES: No edema, no skin discoloration or tenderness.  No palpable cords. LYMPH NODES: No palpable cervical, supraclavicular, axillary or inguinal adenopathy  NEUROLOGICAL: Unremarkable. PSYCH:  Appropriate.   LAB RESULTS:    No results found for: SPEP, UPEP  Lab Results  Component Value Date   WBC 4.3 01/14/2015   NEUTROABS 2.2 01/14/2015   HGB 11.2* 01/14/2015   HCT 33.7* 01/14/2015   MCV 95.5 01/14/2015   PLT 166 01/14/2015      Chemistry             STUDIES Component     Latest Ref Rng 05/20/2014 05/23/2014 07/22/2014 08/20/2014 09/10/2014  CEA     0.0 - 4.7 ng/mL 775.3 (H) 603.3 (H) 65.4 (H) 26.3 (H) 14.6 (H)   September, 2016                     Ref Range 4wk ago    CEA 0.0 - 4.7 ng/mL 10.4 (H)         ASSESSMENT:   stage IV undifferentiated carcinoma (small cell) of lung There is no evidence of recurrent disease.2.rectal bleeding.  Colonoscopy revealed mid sigmoid colon cancer 2.  Carcinoma of colon metastases to liver stage IV disease k-ras wild type  Progressive decline on CEA suggesting good response to the chemotherapy Patient has been tolerating chemotherapy very well.  Without any significant side effect no evidence of neuropathy. ENT evaluation reveals sialoadenitis Patient is tolerating treatment with a reduced dose Patient was advised to continue doxycycline which he ran out Proceed with Chemotherapy Patient will receive pneumonia vaccine today Magnesium supplement would be given if needed All lab data has been reviewed Started blood pressure is less than 90 at present time and will be closely monitored  Treatment would be every 3 weeks apart  Carcinoma of sigmoid colon   Staging form: Colon and Rectum, AJCC 7th Edition     Clinical: Stage IVB (yT3, N1a, M1b) - Marni Griffon, MD   01/14/2015 9:12 AM

## 2015-01-15 LAB — CEA: CEA: 6.7 ng/mL — AB (ref 0.0–4.7)

## 2015-01-16 ENCOUNTER — Inpatient Hospital Stay: Payer: Medicare Other | Attending: Oncology

## 2015-01-16 DIAGNOSIS — Z85118 Personal history of other malignant neoplasm of bronchus and lung: Secondary | ICD-10-CM | POA: Insufficient documentation

## 2015-01-16 DIAGNOSIS — Z7952 Long term (current) use of systemic steroids: Secondary | ICD-10-CM | POA: Diagnosis not present

## 2015-01-16 DIAGNOSIS — Z5112 Encounter for antineoplastic immunotherapy: Secondary | ICD-10-CM | POA: Insufficient documentation

## 2015-01-16 DIAGNOSIS — Z9221 Personal history of antineoplastic chemotherapy: Secondary | ICD-10-CM | POA: Insufficient documentation

## 2015-01-16 DIAGNOSIS — Z87442 Personal history of urinary calculi: Secondary | ICD-10-CM | POA: Diagnosis not present

## 2015-01-16 DIAGNOSIS — C801 Malignant (primary) neoplasm, unspecified: Secondary | ICD-10-CM

## 2015-01-16 DIAGNOSIS — Z5111 Encounter for antineoplastic chemotherapy: Secondary | ICD-10-CM | POA: Diagnosis not present

## 2015-01-16 DIAGNOSIS — L271 Localized skin eruption due to drugs and medicaments taken internally: Secondary | ICD-10-CM | POA: Diagnosis not present

## 2015-01-16 DIAGNOSIS — Z79899 Other long term (current) drug therapy: Secondary | ICD-10-CM | POA: Insufficient documentation

## 2015-01-16 DIAGNOSIS — Z85841 Personal history of malignant neoplasm of brain: Secondary | ICD-10-CM | POA: Diagnosis not present

## 2015-01-16 DIAGNOSIS — D701 Agranulocytosis secondary to cancer chemotherapy: Secondary | ICD-10-CM | POA: Diagnosis not present

## 2015-01-16 DIAGNOSIS — C187 Malignant neoplasm of sigmoid colon: Secondary | ICD-10-CM | POA: Diagnosis not present

## 2015-01-16 DIAGNOSIS — T451X5S Adverse effect of antineoplastic and immunosuppressive drugs, sequela: Secondary | ICD-10-CM | POA: Diagnosis not present

## 2015-01-16 DIAGNOSIS — C787 Secondary malignant neoplasm of liver and intrahepatic bile duct: Secondary | ICD-10-CM | POA: Diagnosis not present

## 2015-01-16 DIAGNOSIS — R1310 Dysphagia, unspecified: Secondary | ICD-10-CM | POA: Diagnosis not present

## 2015-01-16 DIAGNOSIS — Z923 Personal history of irradiation: Secondary | ICD-10-CM | POA: Insufficient documentation

## 2015-01-16 DIAGNOSIS — Z8546 Personal history of malignant neoplasm of prostate: Secondary | ICD-10-CM | POA: Insufficient documentation

## 2015-01-16 DIAGNOSIS — Z23 Encounter for immunization: Secondary | ICD-10-CM | POA: Diagnosis not present

## 2015-01-16 MED ORDER — SODIUM CHLORIDE 0.9 % IJ SOLN
10.0000 mL | Freq: Once | INTRAMUSCULAR | Status: AC
Start: 2015-01-16 — End: 2015-01-16
  Administered 2015-01-16: 10 mL via INTRAVENOUS
  Filled 2015-01-16: qty 10

## 2015-01-16 MED ORDER — HEPARIN SOD (PORK) LOCK FLUSH 100 UNIT/ML IV SOLN
500.0000 [IU] | Freq: Once | INTRAVENOUS | Status: AC
  Administered 2015-01-16: 500 [IU] via INTRAVENOUS
  Filled 2015-01-16: qty 5

## 2015-01-16 NOTE — Progress Notes (Signed)
Per Dr. Oliva Bustard, patient to get pneumonia vaccine on next visit - informed patient.  LJ

## 2015-02-04 ENCOUNTER — Inpatient Hospital Stay: Payer: Medicare Other

## 2015-02-04 ENCOUNTER — Inpatient Hospital Stay (HOSPITAL_BASED_OUTPATIENT_CLINIC_OR_DEPARTMENT_OTHER): Payer: Medicare Other | Admitting: Oncology

## 2015-02-04 ENCOUNTER — Encounter: Payer: Self-pay | Admitting: Oncology

## 2015-02-04 VITALS — BP 128/86 | HR 97 | Temp 94.4°F | Resp 18 | Wt 153.2 lb

## 2015-02-04 DIAGNOSIS — C187 Malignant neoplasm of sigmoid colon: Secondary | ICD-10-CM

## 2015-02-04 DIAGNOSIS — Z8546 Personal history of malignant neoplasm of prostate: Secondary | ICD-10-CM

## 2015-02-04 DIAGNOSIS — Z923 Personal history of irradiation: Secondary | ICD-10-CM

## 2015-02-04 DIAGNOSIS — R1310 Dysphagia, unspecified: Secondary | ICD-10-CM

## 2015-02-04 DIAGNOSIS — L271 Localized skin eruption due to drugs and medicaments taken internally: Secondary | ICD-10-CM

## 2015-02-04 DIAGNOSIS — D701 Agranulocytosis secondary to cancer chemotherapy: Secondary | ICD-10-CM

## 2015-02-04 DIAGNOSIS — C787 Secondary malignant neoplasm of liver and intrahepatic bile duct: Secondary | ICD-10-CM | POA: Diagnosis not present

## 2015-02-04 DIAGNOSIS — Z85841 Personal history of malignant neoplasm of brain: Secondary | ICD-10-CM

## 2015-02-04 DIAGNOSIS — Z7952 Long term (current) use of systemic steroids: Secondary | ICD-10-CM

## 2015-02-04 DIAGNOSIS — Z9221 Personal history of antineoplastic chemotherapy: Secondary | ICD-10-CM

## 2015-02-04 DIAGNOSIS — Z5111 Encounter for antineoplastic chemotherapy: Secondary | ICD-10-CM | POA: Diagnosis not present

## 2015-02-04 DIAGNOSIS — T451X5S Adverse effect of antineoplastic and immunosuppressive drugs, sequela: Secondary | ICD-10-CM

## 2015-02-04 DIAGNOSIS — Z85118 Personal history of other malignant neoplasm of bronchus and lung: Secondary | ICD-10-CM

## 2015-02-04 DIAGNOSIS — Z5112 Encounter for antineoplastic immunotherapy: Secondary | ICD-10-CM | POA: Diagnosis not present

## 2015-02-04 LAB — CBC WITH DIFFERENTIAL/PLATELET
BASOS ABS: 0 10*3/uL (ref 0–0.1)
BASOS PCT: 0 %
EOS ABS: 0.1 10*3/uL (ref 0–0.7)
EOS PCT: 4 %
HCT: 34.4 % — ABNORMAL LOW (ref 40.0–52.0)
Hemoglobin: 11.5 g/dL — ABNORMAL LOW (ref 13.0–18.0)
LYMPHS PCT: 38 %
Lymphs Abs: 1.1 10*3/uL (ref 1.0–3.6)
MCH: 31.6 pg (ref 26.0–34.0)
MCHC: 33.6 g/dL (ref 32.0–36.0)
MCV: 94.1 fL (ref 80.0–100.0)
Monocytes Absolute: 0.8 10*3/uL (ref 0.2–1.0)
Monocytes Relative: 25 %
Neutro Abs: 1 10*3/uL — ABNORMAL LOW (ref 1.4–6.5)
Neutrophils Relative %: 33 %
PLATELETS: 167 10*3/uL (ref 150–440)
RBC: 3.65 MIL/uL — AB (ref 4.40–5.90)
RDW: 15.4 % — ABNORMAL HIGH (ref 11.5–14.5)
WBC: 3 10*3/uL — AB (ref 3.8–10.6)

## 2015-02-04 LAB — COMPREHENSIVE METABOLIC PANEL
ALBUMIN: 3.8 g/dL (ref 3.5–5.0)
ALT: 14 U/L — AB (ref 17–63)
AST: 24 U/L (ref 15–41)
Alkaline Phosphatase: 102 U/L (ref 38–126)
Anion gap: 5 (ref 5–15)
BUN: 6 mg/dL (ref 6–20)
CHLORIDE: 103 mmol/L (ref 101–111)
CO2: 28 mmol/L (ref 22–32)
CREATININE: 0.73 mg/dL (ref 0.61–1.24)
Calcium: 9 mg/dL (ref 8.9–10.3)
GFR calc Af Amer: >60 mL/min (ref >60)
GFR calc non Af Amer: >60 mL/min (ref >60)
Glucose, Bld: 90 mg/dL (ref 65–99)
POTASSIUM: 3.6 mmol/L (ref 3.5–5.1)
SODIUM: 136 mmol/L (ref 135–145)
Total Bilirubin: 0.4 mg/dL (ref 0.3–1.2)
Total Protein: 7.4 g/dL (ref 6.5–8.1)

## 2015-02-04 LAB — MAGNESIUM: Magnesium: 1.7 mg/dL (ref 1.7–2.4)

## 2015-02-04 MED ORDER — SODIUM CHLORIDE 0.9 % IJ SOLN
10.0000 mL | INTRAMUSCULAR | Status: DC | PRN
  Filled 2015-02-04: qty 10

## 2015-02-04 MED ORDER — HEPARIN SOD (PORK) LOCK FLUSH 100 UNIT/ML IV SOLN
INTRAVENOUS | Status: AC
  Filled 2015-02-04: qty 5

## 2015-02-04 MED ORDER — HEPARIN SOD (PORK) LOCK FLUSH 100 UNIT/ML IV SOLN: 500.0000 [IU] | Freq: Once | INTRAVENOUS | Status: DC

## 2015-02-04 NOTE — Progress Notes (Signed)
[place] @ [facility] [phone]) [phone]  Fax:(336) [phone]     Delonte Musich OB: 12/03/1940  MR#: [id number]  GGE#:[id number]  Patient Care Team: No Pcp Per Patient as PCP - General (General Practice) [name], MD (Oncology) [name], MD (General Surgery)  CHIEF COMPLAINT:  Chief Complaint  Patient presents with  . 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 MRI scan of brain shows the right parietal lobe lesion (T4, N2, M1 disease) September, 2014 stage IV 3. Stereotactic radiation therapy to the brain been arranged.  Patient was started on carboplatinum and VP-16. 4. Chest radiation started on December, 2014 5. Patient finished radiation to the chest in January of 2015.  Received total 6000 rads 6. Finished consolidation chemotherapy on March 14, 2013 7. PET scan shows progressive disease in the liver.  September, 2015 8. Started on carboplatinum and CPT-11 from November 14, 2013 9.cancer of the mid sigmoid colon.  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 patient is on FOLFOX and vectibix from April of 2016          04/24/2014 Initial Diagnosis Carcinoma of sigmoid colon     Oncology Flowsheet 11/12/2014 12/03/2014 12/03/2014 12/24/2014 12/24/2014 01/14/2015 01/14/2015  Day, Cycle   Day 1, Cycle 8   Day 1, 9   Day 1, Cycle 10    dexamethasone (DECADRON) IV   [ 10 mg ]   [ 10 mg ]   [ 10 mg ]    fluorouracil (ADRUCIL) IV 2,000 mg/m2 350 mg/m2 2,000 mg/m2 350 mg/m2 1,850 mg/m2 350 mg/m2 1,850 mg/m2  fosaprepitant (EMEND) IV - - - - - - -    leucovorin IV   750 mg   750 mg   750 mg    ondansetron (ZOFRAN) IV   [ 8 mg ]   [ 8 mg ]   [ 8 mg ]    oxaliplatin (ELOXATIN) IV   85 mg/m2   75 mg/m2   75 mg/m2    palonosetron (ALOXI) IV - - - - - - -  panitumumab (VECTIBIX) IV   400 mg   400 mg   400 mg      INTERVAL HISTORY:  Patient is here for ongoing evaluation and continuation of chemotherapy.  As developed rash on the face secondary to [place].  No chills.  Patient is slightly neutropenic but without any fever.  No chills.  No fever.  Rash has improved. Patient says that when he takes hold for doxycycline he develops nosebleeds.  He stopped doxycycline he has some rash on the face secondary to [facility] Patient is here for ongoing evaluation regarding stage IV carcinoma of colon for continuation of chemotherapy No nausea no vomiting no chills or fever. Neutrophil count is 1000.  ROS Gen. status: Alert oriented not any acute distress. HEENT: No headache.  No dizziness.  No soreness in the mouth.  Pain in the right side of the throat.  Has some difficulty swallowing Wisdom tooth from the left side of the mouth was removed.  Swelling and bleeding  No palpable masses in the neck Lungs: No cough.  No hemoptysis.  No chest pain.  No shortness of breath. Cardiac: No chest pain.  No paroxysmal dyspnea.  No orthopnea.  No palpitation Abdomen: No abdominal pain.  No diarrhea.  No nausea.  No vomiting.  Appetite is stable.  No rectal bleeding. Skin: No ecchymosis.   Marland Kitchenrecurrence  Of rash on face and chest wall area. No itching.  Maculopapular rash on the face Neuro: No headache.  No dizziness.  No weakness in upper or lower extremity.  No evidence of tingling numbness.. Lower extremity no edema As per HPI. Otherwise, a complete review of systems is negatve.  PAST MEDICAL HISTORY: Past Medical History  Diagnosis Date  . Kidney stones 1975  . Lung cancer [facility]) 2014    Radiation therapy  . Liver cancer ([place])   . Colon cancer ([place])  01/2014  . Prostate cancer [facility]) 2005    treated by Dr Eliberto Ivory  . Brain cancer ([place]) 01/2014    Chemotherapy and radiation treatment    PAST SURGICAL HISTORY: Past Surgical History  Procedure Laterality Date  . Kidney stones  1975  . Prostate surgery  2005    partial removal due to cancer  . Colon surgery  05-03-14    Resection of Sigmoid Colon and liver biopsy  . Portacath placement  2014    FAMILY HISTORY Family History  Problem Relation Age of Onset  . Lung cancer Father   . Alzheimer's disease Mother        ADVANCED DIRECTIVES: Patient does have advanced health care directive   HEALTH MAINTENANCE: Social History  Substance Use Topics  . Smoking status: Former Smoker -- 35 years  . Smokeless tobacco: Never Used  . Alcohol Use: No      No Known Allergies  Current Outpatient Prescriptions  Medication Sig Dispense Refill  . ibuprofen (ADVIL,MOTRIN) 200 MG tablet Take 200 mg by mouth every 6 (six) hours as needed.    . lidocaine-prilocaine (EMLA) cream Apply to affected area once 30 g 3  . oxyCODONE (OXY IR/ROXICODONE) 5 MG immediate release tablet Take 5 mg by mouth every 6 (six) hours as needed.     . predniSONE (DELTASONE) 10 MG tablet Take 1 tablet (10 mg total) by mouth daily with breakfast. 30 tablet 0  . PROVENTIL HFA 108 (90 BASE) MCG/ACT inhaler Inhale 90 mcg into the lungs every 6 (six) hours as needed.    . Triamcinolone Acetonide (TRIAMCINOLONE 0.1 % CREAM : EUCERIN) CREA Apply 1 application topically 2 (two) times daily. 1 each 3   No current facility-administered medications for this visit.   Facility-Administered Medications Ordered in Other Visits  Medication Dose Route Frequency Provider Last Rate Last Dose  . heparin lock flush 100 unit/mL  500 Units Intravenous Once [name], MD      . sodium chloride 0.9 % injection 10 mL  10 mL Intracatheter PRN [name], MD   10 mL at 07/10/14 1428  . sodium chloride 0.9 % injection 10 mL  10 mL  Intravenous PRN [name], MD        OBJECTIVE: Filed Vitals:   02/04/15 0914  BP: 128/86  Pulse: 97  Temp: 94.4 F (34.7 C)  Resp: 18     Body mass index is 19.66 kg/(m^2).    ECOG FS:1 - Symptomatic but completely ambulatory  Physical Exam  GENERAL:  Well developed, well nourished, sitting comfortably in the exam room in no acute distress. MENTAL STATUS:  Alert and oriented to person, place  and time. HEAD:Normocephalic, atraumatic, face symmetric, no Cushingoid features. EYES:  .  Pupils equal round and reactive to light and accomodation.  No conjunctivitis or scleral icterus. ENT:  Oropharynx clear without lesion.  Tongue normal. Mucous membranes moist. . Patient has pain intermittently on the right side of the throat has enlarged tonsils right is more enlarged than the left I do not see any ulcer or fungating mass RESPIRATORY:  Clear to auscultation without rales, wheezes or rhonchi. CARDIOVASCULAR:  Regular rate and rhythm without murmur, rub or gallop. BREAST:  Right breast without masses, skin changes or nipple discharge.  Left breast without masses, skin changes or nipple discharge. ABDOMEN:  Soft, non-tender, with active bowel sounds, and no hepatosplenomegaly.  No masses. BACK:  No CVA tenderness.  No tenderness on percussion of the back or rib cage. SKIN: Dry scaly skin and the rash on the face. Grade 2 EXTREMITIES: No edema, no skin discoloration or tenderness.  No palpable cords. LYMPH NODES: No palpable cervical, supraclavicular, axillary or inguinal adenopathy  NEUROLOGICAL: Unremarkable. PSYCH:  Appropriate.   LAB RESULTS:    No results found for: SPEP, UPEP  Lab Results  Component Value Date   WBC 3.0* 02/04/2015   NEUTROABS 1.0* 02/04/2015   HGB 11.5* 02/04/2015   HCT 34.4* 02/04/2015   MCV 94.1 02/04/2015   PLT 167 02/04/2015      Chemistry             STUDIES    ASSESSMENT:   stage IV undifferentiated carcinoma (small cell) of  lung There is no evidence of recurrent disease.2.rectal bleeding.  Colonoscopy revealed mid sigmoid colon cancer 2.  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  (At the beginning of diagnosis was more than 700) Patient  is myelosuppressed secondary to chemotherapy. Was advised to call me if spikes fever more than 100.  Or any chills or fever.  Hold chemotherapy Reevaluate patient next week and proceed with chemotherapy Magnesium level next week Carcinoma of sigmoid colon   Staging form: Colon and Rectum, AJCC 7th Edition     Clinical: Stage IVB (yT3, N1a, M1b) - Marni Griffon, MD   02/04/2015 9:43 AM

## 2015-02-04 NOTE — Progress Notes (Signed)
Patient states he stopped taking his Doxycycline because it made his nose bleed.

## 2015-02-05 LAB — CEA: CEA: 6.5 ng/mL — ABNORMAL HIGH (ref 0.0–4.7)

## 2015-02-12 ENCOUNTER — Inpatient Hospital Stay: Payer: Medicare Other

## 2015-02-12 VITALS — BP 125/76 | HR 66 | Temp 96.0°F | Resp 18

## 2015-02-12 DIAGNOSIS — C787 Secondary malignant neoplasm of liver and intrahepatic bile duct: Secondary | ICD-10-CM | POA: Diagnosis not present

## 2015-02-12 DIAGNOSIS — C187 Malignant neoplasm of sigmoid colon: Secondary | ICD-10-CM | POA: Diagnosis not present

## 2015-02-12 DIAGNOSIS — Z85118 Personal history of other malignant neoplasm of bronchus and lung: Secondary | ICD-10-CM | POA: Diagnosis not present

## 2015-02-12 DIAGNOSIS — Z85841 Personal history of malignant neoplasm of brain: Secondary | ICD-10-CM | POA: Diagnosis not present

## 2015-02-12 DIAGNOSIS — Z5111 Encounter for antineoplastic chemotherapy: Secondary | ICD-10-CM | POA: Diagnosis not present

## 2015-02-12 DIAGNOSIS — Z5112 Encounter for antineoplastic immunotherapy: Secondary | ICD-10-CM | POA: Diagnosis not present

## 2015-02-12 LAB — COMPREHENSIVE METABOLIC PANEL
ALT: 18 U/L (ref 17–63)
AST: 25 U/L (ref 15–41)
Albumin: 3.8 g/dL (ref 3.5–5.0)
Alkaline Phosphatase: 92 U/L (ref 38–126)
Anion gap: 4 — ABNORMAL LOW (ref 5–15)
BILIRUBIN TOTAL: 0.5 mg/dL (ref 0.3–1.2)
BUN: 8 mg/dL (ref 6–20)
CALCIUM: 9 mg/dL (ref 8.9–10.3)
CHLORIDE: 107 mmol/L (ref 101–111)
CO2: 28 mmol/L (ref 22–32)
Creatinine, Ser: 0.68 mg/dL (ref 0.61–1.24)
Glucose, Bld: 83 mg/dL (ref 65–99)
Potassium: 3.8 mmol/L (ref 3.5–5.1)
Sodium: 139 mmol/L (ref 135–145)
Total Protein: 6.9 g/dL (ref 6.5–8.1)

## 2015-02-12 LAB — CBC WITH DIFFERENTIAL/PLATELET
Basophils Absolute: 0 10*3/uL (ref 0–0.1)
Basophils Relative: 1 %
EOS PCT: 2 %
Eosinophils Absolute: 0.1 10*3/uL (ref 0–0.7)
HEMATOCRIT: 32.2 % — AB (ref 40.0–52.0)
Hemoglobin: 10.6 g/dL — ABNORMAL LOW (ref 13.0–18.0)
LYMPHS ABS: 1 10*3/uL (ref 1.0–3.6)
LYMPHS PCT: 25 %
MCH: 31.2 pg (ref 26.0–34.0)
MCHC: 33 g/dL (ref 32.0–36.0)
MCV: 94.3 fL (ref 80.0–100.0)
MONO ABS: 0.6 10*3/uL (ref 0.2–1.0)
Monocytes Relative: 16 %
NEUTROS ABS: 2.2 10*3/uL (ref 1.4–6.5)
Neutrophils Relative %: 56 %
PLATELETS: 169 10*3/uL (ref 150–440)
RBC: 3.41 MIL/uL — AB (ref 4.40–5.90)
RDW: 15.5 % — AB (ref 11.5–14.5)
WBC: 3.9 10*3/uL (ref 3.8–10.6)

## 2015-02-12 LAB — MAGNESIUM: Magnesium: 1.9 mg/dL (ref 1.7–2.4)

## 2015-02-12 MED ORDER — LEUCOVORIN CALCIUM INJECTION 350 MG: 750.0000 mg | Freq: Once | INTRAVENOUS | Status: DC

## 2015-02-12 MED ORDER — HEPARIN SOD (PORK) LOCK FLUSH 100 UNIT/ML IV SOLN: 500.0000 [IU] | Freq: Once | INTRAVENOUS | Status: DC

## 2015-02-12 MED ORDER — SODIUM CHLORIDE 0.9 % IJ SOLN
10.0000 mL | Freq: Once | INTRAMUSCULAR | Status: AC
  Administered 2015-02-12: 10 mL via INTRAVENOUS
  Filled 2015-02-12: qty 10

## 2015-02-12 MED ORDER — SODIUM CHLORIDE 0.9 % IV SOLN
Freq: Once | INTRAVENOUS | Status: AC
  Administered 2015-02-12: 10:00:00 via INTRAVENOUS
  Filled 2015-02-12: qty 4

## 2015-02-12 MED ORDER — SODIUM CHLORIDE 0.9 % IV SOLN
400.0000 mg | Freq: Once | INTRAVENOUS | Status: AC
  Administered 2015-02-12: 400 mg via INTRAVENOUS
  Filled 2015-02-12: qty 20

## 2015-02-12 MED ORDER — DEXTROSE 5 % IV SOLN
Freq: Once | INTRAVENOUS | Status: AC
Start: 2015-02-12 — End: 2015-02-12
  Administered 2015-02-12: 12:00:00 via INTRAVENOUS
  Filled 2015-02-12: qty 1000

## 2015-02-12 MED ORDER — OXALIPLATIN CHEMO INJECTION 100 MG/20ML
75.0000 mg/m2 | Freq: Once | INTRAVENOUS | Status: AC
  Administered 2015-02-12: 140 mg via INTRAVENOUS
  Filled 2015-02-12: qty 20

## 2015-02-12 MED ORDER — LEUCOVORIN CALCIUM INJECTION 100 MG
38.0000 mg | Freq: Once | INTRAMUSCULAR | Status: AC
Start: 2015-02-12 — End: 2015-02-12
  Administered 2015-02-12: 38 mg via INTRAVENOUS
  Filled 2015-02-12: qty 1.9

## 2015-02-12 MED ORDER — PNEUMOCOCCAL VAC POLYVALENT 25 MCG/0.5ML IJ INJ
0.5000 mL | INJECTION | Freq: Once | INTRAMUSCULAR | Status: AC
  Administered 2015-02-12: 0.5 mL via INTRAMUSCULAR
  Filled 2015-02-12: qty 0.5

## 2015-02-12 MED ORDER — FLUOROURACIL CHEMO INJECTION 2.5 GM/50ML
350.0000 mg/m2 | Freq: Once | INTRAVENOUS | Status: AC
  Administered 2015-02-12: 650 mg via INTRAVENOUS
  Filled 2015-02-12: qty 13

## 2015-02-12 MED ORDER — SODIUM CHLORIDE 0.9 % IV SOLN
Freq: Once | INTRAVENOUS | Status: AC
  Administered 2015-02-12: 10:00:00 via INTRAVENOUS
  Filled 2015-02-12: qty 1000

## 2015-02-12 MED ORDER — SODIUM CHLORIDE 0.9 % IV SOLN
1850.0000 mg/m2 | INTRAVENOUS | Status: DC
  Administered 2015-02-12: 3500 mg via INTRAVENOUS
  Filled 2015-02-12: qty 70

## 2015-02-14 ENCOUNTER — Inpatient Hospital Stay: Payer: Medicare Other

## 2015-02-14 DIAGNOSIS — C787 Secondary malignant neoplasm of liver and intrahepatic bile duct: Secondary | ICD-10-CM | POA: Diagnosis not present

## 2015-02-14 DIAGNOSIS — Z5112 Encounter for antineoplastic immunotherapy: Secondary | ICD-10-CM | POA: Diagnosis not present

## 2015-02-14 DIAGNOSIS — Z85841 Personal history of malignant neoplasm of brain: Secondary | ICD-10-CM | POA: Diagnosis not present

## 2015-02-14 DIAGNOSIS — C801 Malignant (primary) neoplasm, unspecified: Secondary | ICD-10-CM

## 2015-02-14 DIAGNOSIS — Z5111 Encounter for antineoplastic chemotherapy: Secondary | ICD-10-CM | POA: Diagnosis not present

## 2015-02-14 DIAGNOSIS — Z85118 Personal history of other malignant neoplasm of bronchus and lung: Secondary | ICD-10-CM | POA: Diagnosis not present

## 2015-02-14 DIAGNOSIS — C187 Malignant neoplasm of sigmoid colon: Secondary | ICD-10-CM | POA: Diagnosis not present

## 2015-02-14 MED ORDER — SODIUM CHLORIDE 0.9 % IJ SOLN
10.0000 mL | INTRAMUSCULAR | Status: DC | PRN
  Administered 2015-02-14: 10 mL via INTRAVENOUS
  Filled 2015-02-14: qty 10

## 2015-02-14 MED ORDER — HEPARIN SOD (PORK) LOCK FLUSH 100 UNIT/ML IV SOLN
500.0000 [IU] | Freq: Once | INTRAVENOUS | Status: AC
  Administered 2015-02-14: 500 [IU] via INTRAVENOUS
  Filled 2015-02-14: qty 5

## 2015-02-21 ENCOUNTER — Other Ambulatory Visit: Payer: Self-pay | Admitting: *Deleted

## 2015-02-21 DIAGNOSIS — C187 Malignant neoplasm of sigmoid colon: Secondary | ICD-10-CM

## 2015-02-26 ENCOUNTER — Inpatient Hospital Stay: Payer: Medicare Other | Admitting: Oncology

## 2015-02-26 ENCOUNTER — Inpatient Hospital Stay: Payer: Medicare Other

## 2015-02-28 ENCOUNTER — Inpatient Hospital Stay: Payer: Medicare Other

## 2015-03-05 ENCOUNTER — Encounter: Payer: Self-pay | Admitting: Oncology

## 2015-03-05 ENCOUNTER — Inpatient Hospital Stay: Payer: Medicare Other

## 2015-03-05 ENCOUNTER — Inpatient Hospital Stay: Payer: Medicare Other | Attending: Oncology | Admitting: Oncology

## 2015-03-05 VITALS — BP 136/93 | HR 99 | Temp 95.0°F | Resp 18 | Wt 149.0 lb

## 2015-03-05 DIAGNOSIS — Z5112 Encounter for antineoplastic immunotherapy: Secondary | ICD-10-CM | POA: Diagnosis not present

## 2015-03-05 DIAGNOSIS — Z79899 Other long term (current) drug therapy: Secondary | ICD-10-CM | POA: Insufficient documentation

## 2015-03-05 DIAGNOSIS — Z5111 Encounter for antineoplastic chemotherapy: Secondary | ICD-10-CM | POA: Insufficient documentation

## 2015-03-05 DIAGNOSIS — R040 Epistaxis: Secondary | ICD-10-CM | POA: Insufficient documentation

## 2015-03-05 DIAGNOSIS — Z923 Personal history of irradiation: Secondary | ICD-10-CM | POA: Diagnosis not present

## 2015-03-05 DIAGNOSIS — Z87891 Personal history of nicotine dependence: Secondary | ICD-10-CM | POA: Insufficient documentation

## 2015-03-05 DIAGNOSIS — J351 Hypertrophy of tonsils: Secondary | ICD-10-CM | POA: Diagnosis not present

## 2015-03-05 DIAGNOSIS — L271 Localized skin eruption due to drugs and medicaments taken internally: Secondary | ICD-10-CM | POA: Diagnosis not present

## 2015-03-05 DIAGNOSIS — Z8546 Personal history of malignant neoplasm of prostate: Secondary | ICD-10-CM | POA: Insufficient documentation

## 2015-03-05 DIAGNOSIS — C787 Secondary malignant neoplasm of liver and intrahepatic bile duct: Secondary | ICD-10-CM | POA: Diagnosis not present

## 2015-03-05 DIAGNOSIS — Z85841 Personal history of malignant neoplasm of brain: Secondary | ICD-10-CM | POA: Diagnosis not present

## 2015-03-05 DIAGNOSIS — R1319 Other dysphagia: Secondary | ICD-10-CM | POA: Diagnosis not present

## 2015-03-05 DIAGNOSIS — Z85118 Personal history of other malignant neoplasm of bronchus and lung: Secondary | ICD-10-CM | POA: Insufficient documentation

## 2015-03-05 DIAGNOSIS — C187 Malignant neoplasm of sigmoid colon: Secondary | ICD-10-CM | POA: Diagnosis not present

## 2015-03-05 DIAGNOSIS — C3490 Malignant neoplasm of unspecified part of unspecified bronchus or lung: Secondary | ICD-10-CM

## 2015-03-05 LAB — CBC WITH DIFFERENTIAL/PLATELET
BASOS PCT: 1 %
Basophils Absolute: 0 10*3/uL (ref 0–0.1)
EOS ABS: 0.1 10*3/uL (ref 0–0.7)
EOS PCT: 3 %
HCT: 33.5 % — ABNORMAL LOW (ref 40.0–52.0)
HEMOGLOBIN: 11.1 g/dL — AB (ref 13.0–18.0)
Lymphocytes Relative: 29 %
Lymphs Abs: 1.2 10*3/uL (ref 1.0–3.6)
MCH: 30.4 pg (ref 26.0–34.0)
MCHC: 33.1 g/dL (ref 32.0–36.0)
MCV: 92.1 fL (ref 80.0–100.0)
Monocytes Absolute: 0.8 10*3/uL (ref 0.2–1.0)
Monocytes Relative: 19 %
NEUTROS PCT: 48 %
Neutro Abs: 2.1 10*3/uL (ref 1.4–6.5)
PLATELETS: 164 10*3/uL (ref 150–440)
RBC: 3.64 MIL/uL — AB (ref 4.40–5.90)
RDW: 15.4 % — ABNORMAL HIGH (ref 11.5–14.5)
WBC: 4.3 10*3/uL (ref 3.8–10.6)

## 2015-03-05 LAB — COMPREHENSIVE METABOLIC PANEL
ALT: 18 U/L (ref 17–63)
AST: 30 U/L (ref 15–41)
Albumin: 4.1 g/dL (ref 3.5–5.0)
Alkaline Phosphatase: 99 U/L (ref 38–126)
Anion gap: 5 (ref 5–15)
BUN: 7 mg/dL (ref 6–20)
CHLORIDE: 102 mmol/L (ref 101–111)
CO2: 26 mmol/L (ref 22–32)
CREATININE: 0.79 mg/dL (ref 0.61–1.24)
Calcium: 9.3 mg/dL (ref 8.9–10.3)
GFR calc non Af Amer: >60 mL/min (ref >60)
Glucose, Bld: 93 mg/dL (ref 65–99)
POTASSIUM: 3.7 mmol/L (ref 3.5–5.1)
SODIUM: 133 mmol/L — AB (ref 135–145)
Total Bilirubin: 0.8 mg/dL (ref 0.3–1.2)
Total Protein: 7.5 g/dL (ref 6.5–8.1)

## 2015-03-05 LAB — MAGNESIUM: MAGNESIUM: 1.8 mg/dL (ref 1.7–2.4)

## 2015-03-05 MED ORDER — PANITUMUMAB CHEMO INJECTION 100 MG/5ML
400.0000 mg | Freq: Once | INTRAVENOUS | Status: AC
  Administered 2015-03-05: 400 mg via INTRAVENOUS
  Filled 2015-03-05: qty 20

## 2015-03-05 MED ORDER — SODIUM CHLORIDE 0.9 % IV SOLN
Freq: Once | INTRAVENOUS | Status: AC
  Administered 2015-03-05: 11:00:00 via INTRAVENOUS
  Filled 2015-03-05: qty 4

## 2015-03-05 MED ORDER — FLUOROURACIL CHEMO INJECTION 2.5 GM/50ML
350.0000 mg/m2 | Freq: Once | INTRAVENOUS | Status: AC
  Administered 2015-03-05: 650 mg via INTRAVENOUS
  Filled 2015-03-05: qty 13

## 2015-03-05 MED ORDER — LEUCOVORIN CALCIUM INJECTION 100 MG
20.0000 mg/m2 | Freq: Once | INTRAMUSCULAR | Status: AC
  Administered 2015-03-05: 38 mg via INTRAVENOUS
  Filled 2015-03-05: qty 1.9

## 2015-03-05 MED ORDER — SODIUM CHLORIDE 0.9 % IJ SOLN
10.0000 mL | Freq: Once | INTRAMUSCULAR | Status: AC
  Administered 2015-03-05: 10 mL via INTRAVENOUS
  Filled 2015-03-05: qty 10

## 2015-03-05 MED ORDER — DEXTROSE 5 % IV SOLN
Freq: Once | INTRAVENOUS | Status: AC
Start: 2015-03-05 — End: 2015-03-05
  Administered 2015-03-05: 12:00:00 via INTRAVENOUS
  Filled 2015-03-05: qty 1000

## 2015-03-05 MED ORDER — LEUCOVORIN CALCIUM INJECTION 350 MG: 750.0000 mg | Freq: Once | INTRAVENOUS | Status: DC

## 2015-03-05 MED ORDER — HEPARIN SOD (PORK) LOCK FLUSH 100 UNIT/ML IV SOLN
500.0000 [IU] | Freq: Once | INTRAVENOUS | Status: DC
Start: 2015-03-05 — End: 2015-03-05

## 2015-03-05 MED ORDER — OXALIPLATIN CHEMO INJECTION 100 MG/20ML
75.0000 mg/m2 | Freq: Once | INTRAVENOUS | Status: AC
  Administered 2015-03-05: 140 mg via INTRAVENOUS
  Filled 2015-03-05: qty 28

## 2015-03-05 MED ORDER — SODIUM CHLORIDE 0.9 % IV SOLN
Freq: Once | INTRAVENOUS | Status: AC
  Administered 2015-03-05: 10:00:00 via INTRAVENOUS
  Filled 2015-03-05: qty 1000

## 2015-03-05 MED ORDER — SODIUM CHLORIDE 0.9 % IV SOLN
1850.0000 mg/m2 | INTRAVENOUS | Status: DC
  Administered 2015-03-05: 3500 mg via INTRAVENOUS
  Filled 2015-03-05: qty 70

## 2015-03-05 NOTE — Progress Notes (Signed)
Patient continues to have rash from chemo.  Also states he has been having nose bleeds.

## 2015-03-05 NOTE — Progress Notes (Signed)
[place] @ [facility] Telephone:([phone]) [phone]  Fax:(336) [phone]     [name] OB: [birth year]  MR#: [id number]  QZR#:[id number]  Patient Care Team: No Pcp Per Patient as PCP - General (General Practice) [name], MD (Oncology) [name], MD (General Surgery)  CHIEF COMPLAINT:  Chief Complaint  Patient presents with  . 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 MRI scan of brain shows the right parietal lobe lesion (T4, N2, M1 disease) September, 2014 stage IV 3. Stereotactic radiation therapy to the brain been arranged.  Patient was started on carboplatinum and VP-16. 4. Chest radiation started on December, 2014 5. Patient finished radiation to the chest in January of 2015.  Received total 6000 rads 6. Finished consolidation chemotherapy on March 14, 2013 7. PET scan shows progressive disease in the liver.  September, 2015 8. Started on carboplatinum and CPT-11 from November 14, 2013 9.cancer of the mid sigmoid colon.  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 patient is on FOLFOX and vectibix from April of 2016          04/24/2014 Initial Diagnosis Carcinoma of sigmoid colon     Oncology Flowsheet 12/03/2014 12/24/2014 12/24/2014 01/14/2015 01/14/2015 02/12/2015 02/12/2015  Day, Cycle   Day 1, 9   Day 1, Cycle 10   Day 1, Cycle 11    dexamethasone (DECADRON) IV   [ 10 mg ]   [ 10 mg ]   [ 10 mg ]    fluorouracil (ADRUCIL) IV 2,000 mg/m2 350 mg/m2 1,850 mg/m2 350 mg/m2 1,850 mg/m2 350 mg/m2 1,850 mg/m2  fosaprepitant (EMEND) IV  - - - - - - -  leucovorin IV   750 mg   750 mg   38 mg    ondansetron (ZOFRAN) IV   [ 8 mg ]   [ 8 mg ]   [ 8 mg ]    oxaliplatin (ELOXATIN) IV   75 mg/m2   75 mg/m2   75 mg/m2    palonosetron (ALOXI) IV - - - - - - -  panitumumab (VECTIBIX) IV   400 mg   400 mg   400 mg      INTERVAL HISTORY:  Patient is here for ongoing evaluation and continuation of chemotherapy.  As developed rash on the face secondary to [place].  No chills.  Ppatient is taking occasional doxycycline so rash is more prominent on the head face and neck Occasional nosebleed.  No nausea no vomiting no diarrhea.  No soreness in the mouth.  No tingling.  No numbness. ROS Gen. status: Alert oriented not any acute distress. HEENT: No headache.  No dizziness.  No soreness in the mouth.  Pain in the right side of the throat.  Has some difficulty swallowing Wisdom tooth from the left side of the mouth was removed.  Swelling and bleeding  No palpable masses in the neck Lungs: No cough.  No hemoptysis.  No chest pain.  No shortness of breath. Cardiac: No chest pain.  No paroxysmal dyspnea.  No orthopnea.  No palpitation Abdomen: No abdominal pain.  No diarrhea.  No  nausea.  No vomiting.  Appetite is stable.  No rectal bleeding. Skin: No ecchymosis.   Marland Kitchenrecurrence  Of rash on face and chest wall area. No itching.  Maculopapular rash on the face Neuro: No headache.  No dizziness.  No weakness in upper or lower extremity.  No evidence of tingling numbness.. Lower extremity no edema As per HPI. Otherwise, a complete review of systems is negatve.  PAST MEDICAL HISTORY: Past Medical History  Diagnosis Date  . Kidney stones 1975  . Lung cancer [facility]) 2014    Radiation therapy  . Liver cancer ([place])   . Colon cancer ([place]) 01/2014  . Prostate cancer [facility]) 2005    treated by Dr Eliberto Ivory  . Brain cancer ([place]) 01/2014    Chemotherapy and radiation treatment    PAST SURGICAL HISTORY: Past Surgical History  Procedure Laterality Date  .  Kidney stones  1975  . Prostate surgery  2005    partial removal due to cancer  . Colon surgery  05-03-14    Resection of Sigmoid Colon and liver biopsy  . Portacath placement  2014    FAMILY HISTORY Family History  Problem Relation Age of Onset  . Lung cancer Father   . Alzheimer's disease Mother        ADVANCED DIRECTIVES: Patient does have advanced health care directive   HEALTH MAINTENANCE: Social History  Substance Use Topics  . Smoking status: Former Smoker -- 35 years  . Smokeless tobacco: Never Used  . Alcohol Use: No      No Known Allergies  Current Outpatient Prescriptions  Medication Sig Dispense Refill  . ibuprofen (ADVIL,MOTRIN) 200 MG tablet Take 200 mg by mouth every 6 (six) hours as needed.    . lidocaine-prilocaine (EMLA) cream Apply to affected area once 30 g 3  . oxyCODONE (OXY IR/ROXICODONE) 5 MG immediate release tablet Take 5 mg by mouth every 6 (six) hours as needed.     . predniSONE (DELTASONE) 10 MG tablet Take 1 tablet (10 mg total) by mouth daily with breakfast. 30 tablet 0  . PROVENTIL HFA 108 (90 BASE) MCG/ACT inhaler Inhale 90 mcg into the lungs every 6 (six) hours as needed.    . Triamcinolone Acetonide (TRIAMCINOLONE 0.1 % CREAM : EUCERIN) CREA Apply 1 application topically 2 (two) times daily. 1 each 3   No current facility-administered medications for this visit.   Facility-Administered Medications Ordered in Other Visits  Medication Dose Route Frequency Provider Last Rate Last Dose  . heparin lock flush 100 unit/mL  500 Units Intravenous Once [name], MD      . sodium chloride 0.9 % injection 10 mL  10 mL Intracatheter PRN [name], MD   10 mL at 07/10/14 1428    OBJECTIVE: Filed Vitals:   03/05/15 0846  BP: 136/93  Pulse: 99  Temp: 95 F (35 C)  Resp: 18     Body mass index is 19.13 kg/(m^2).    ECOG FS:1 - Symptomatic but completely ambulatory  Physical Exam  GENERAL:  Well developed, well nourished, sitting  comfortably in the exam room in no acute distress. MENTAL STATUS:  Alert and oriented to person, place and time. HEAD:Normocephalic, atraumatic, face symmetric, no Cushingoid features. EYES:  .  Pupils equal round and reactive to light and accomodation.  No conjunctivitis or scleral icterus. ENT:  Oropharynx clear without lesion.  Tongue normal. Mucous membranes moist. . Patient has pain intermittently on the right side of the throat has enlarged tonsils  right is more enlarged than the left I do not see any ulcer or fungating mass RESPIRATORY:  Clear to auscultation without rales, wheezes or rhonchi. CARDIOVASCULAR:  Regular rate and rhythm without murmur, rub or gallop. BREAST:  Right breast without masses, skin changes or nipple discharge.  Left breast without masses, skin changes or nipple discharge. ABDOMEN:  Soft, non-tender, with active bowel sounds, and no hepatosplenomegaly.  No masses. BACK:  No CVA tenderness.  No tenderness on percussion of the back or rib cage. SKIN: Dry scaly skin and the rash on the face. Grade 2 EXTREMITIES: No edema, no skin discoloration or tenderness.  No palpable cords. LYMPH NODES: No palpable cervical, supraclavicular, axillary or inguinal adenopathy  NEUROLOGICAL: Unremarkable. PSYCH:  Appropriate.   LAB RESULTS:    No results found for: SPEP, UPEP  Lab Results  Component Value Date   WBC 4.3 03/05/2015   NEUTROABS 2.1 03/05/2015   HGB 11.1* 03/05/2015   HCT 33.5* 03/05/2015   MCV 92.1 03/05/2015   PLT 164 03/05/2015      Chemistry             STUDIES    ASSESSMENT:   stage IV undifferentiated carcinoma (small cell) of lung There is no evidence of recurrent disease.2.rectal bleeding.  Colonoscopy revealed mid sigmoid colon cancer 2.  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 CEA is pending Restaging workup for lung cancer with a CT scan of chest and for the  colon cancer with a CT scan of abdomen If patient has responded very well and CEAs coming back to normal maintenance VECTIBIX can be considered. Continue chemotherapy at present time Patient was encouraged to use doxycycline more frequently Carcinoma of sigmoid colon   Staging form: Colon and Rectum, AJCC 7th Edition     Clinical: Stage IVB (yT3, N1a, M1b) - Adam Griffon, MD   03/05/2015 9:16 AM

## 2015-03-06 ENCOUNTER — Other Ambulatory Visit: Payer: Medicare Other

## 2015-03-06 ENCOUNTER — Ambulatory Visit: Payer: Medicare Other

## 2015-03-06 ENCOUNTER — Ambulatory Visit: Payer: Medicare Other | Admitting: Oncology

## 2015-03-06 LAB — CEA: CEA: 5.7 ng/mL — ABNORMAL HIGH (ref 0.0–4.7)

## 2015-03-07 ENCOUNTER — Inpatient Hospital Stay: Payer: Medicare Other

## 2015-03-10 ENCOUNTER — Inpatient Hospital Stay: Payer: Medicare Other

## 2015-03-10 DIAGNOSIS — Z5111 Encounter for antineoplastic chemotherapy: Secondary | ICD-10-CM | POA: Diagnosis not present

## 2015-03-10 DIAGNOSIS — Z85118 Personal history of other malignant neoplasm of bronchus and lung: Secondary | ICD-10-CM | POA: Diagnosis not present

## 2015-03-10 DIAGNOSIS — Z5112 Encounter for antineoplastic immunotherapy: Secondary | ICD-10-CM | POA: Diagnosis not present

## 2015-03-10 DIAGNOSIS — Z85841 Personal history of malignant neoplasm of brain: Secondary | ICD-10-CM | POA: Diagnosis not present

## 2015-03-10 DIAGNOSIS — C187 Malignant neoplasm of sigmoid colon: Secondary | ICD-10-CM | POA: Diagnosis not present

## 2015-03-10 DIAGNOSIS — C787 Secondary malignant neoplasm of liver and intrahepatic bile duct: Secondary | ICD-10-CM | POA: Diagnosis not present

## 2015-03-10 DIAGNOSIS — C801 Malignant (primary) neoplasm, unspecified: Secondary | ICD-10-CM

## 2015-03-10 MED ORDER — SODIUM CHLORIDE 0.9 % IJ SOLN
10.0000 mL | Freq: Once | INTRAMUSCULAR | Status: AC
Start: 2015-03-10 — End: 2015-03-10
  Administered 2015-03-10: 10 mL via INTRAVENOUS
  Filled 2015-03-10: qty 10

## 2015-03-10 MED ORDER — SODIUM CHLORIDE 0.9 % IJ SOLN
10.0000 mL | INTRAMUSCULAR | Status: DC | PRN
  Filled 2015-03-10: qty 10

## 2015-03-10 MED ORDER — HEPARIN SOD (PORK) LOCK FLUSH 100 UNIT/ML IV SOLN
500.0000 [IU] | Freq: Once | INTRAVENOUS | Status: AC
  Administered 2015-03-10: 500 [IU] via INTRAVENOUS
  Filled 2015-03-10: qty 5

## 2015-03-10 MED ORDER — HEPARIN SOD (PORK) LOCK FLUSH 100 UNIT/ML IV SOLN: 500.0000 [IU] | Freq: Once | INTRAVENOUS | Status: DC

## 2015-03-19 ENCOUNTER — Ambulatory Visit
Admission: RE | Admit: 2015-03-19 | Discharge: 2015-03-19 | Disposition: A | Discharge status: Home / Self Care | Payer: Medicare Other | Source: Ambulatory Visit | Attending: Oncology | Admitting: Oncology

## 2015-03-19 DIAGNOSIS — J439 Emphysema, unspecified: Secondary | ICD-10-CM | POA: Diagnosis not present

## 2015-03-19 DIAGNOSIS — C787 Secondary malignant neoplasm of liver and intrahepatic bile duct: Secondary | ICD-10-CM | POA: Diagnosis present

## 2015-03-19 DIAGNOSIS — K769 Liver disease, unspecified: Secondary | ICD-10-CM | POA: Insufficient documentation

## 2015-03-19 DIAGNOSIS — C3490 Malignant neoplasm of unspecified part of unspecified bronchus or lung: Secondary | ICD-10-CM | POA: Insufficient documentation

## 2015-03-19 DIAGNOSIS — J479 Bronchiectasis, uncomplicated: Secondary | ICD-10-CM | POA: Diagnosis not present

## 2015-03-19 DIAGNOSIS — Z8546 Personal history of malignant neoplasm of prostate: Secondary | ICD-10-CM | POA: Diagnosis not present

## 2015-03-19 DIAGNOSIS — Z8505 Personal history of malignant neoplasm of liver: Secondary | ICD-10-CM | POA: Diagnosis not present

## 2015-03-19 DIAGNOSIS — C187 Malignant neoplasm of sigmoid colon: Secondary | ICD-10-CM | POA: Insufficient documentation

## 2015-03-19 DIAGNOSIS — R911 Solitary pulmonary nodule: Secondary | ICD-10-CM | POA: Diagnosis not present

## 2015-03-19 DIAGNOSIS — Z859 Personal history of malignant neoplasm, unspecified: Secondary | ICD-10-CM | POA: Diagnosis not present

## 2015-03-19 MED ORDER — IOHEXOL 300 MG/ML  SOLN
75.0000 mL | Freq: Once | INTRAMUSCULAR | Status: AC | PRN
  Administered 2015-03-19: 75 mL via INTRAVENOUS

## 2015-03-26 ENCOUNTER — Telehealth: Payer: Self-pay | Admitting: Pharmacist

## 2015-03-26 ENCOUNTER — Encounter: Payer: Self-pay | Admitting: Oncology

## 2015-03-26 ENCOUNTER — Inpatient Hospital Stay: Payer: Medicare Other | Attending: Oncology

## 2015-03-26 ENCOUNTER — Inpatient Hospital Stay: Payer: Medicare Other

## 2015-03-26 ENCOUNTER — Inpatient Hospital Stay (HOSPITAL_BASED_OUTPATIENT_CLINIC_OR_DEPARTMENT_OTHER): Payer: Medicare Other | Admitting: Oncology

## 2015-03-26 VITALS — BP 115/77 | HR 92 | Temp 95.4°F | Resp 18 | Wt 152.6 lb

## 2015-03-26 DIAGNOSIS — R110 Nausea: Secondary | ICD-10-CM

## 2015-03-26 DIAGNOSIS — R040 Epistaxis: Secondary | ICD-10-CM

## 2015-03-26 DIAGNOSIS — C187 Malignant neoplasm of sigmoid colon: Secondary | ICD-10-CM

## 2015-03-26 DIAGNOSIS — Z85118 Personal history of other malignant neoplasm of bronchus and lung: Secondary | ICD-10-CM

## 2015-03-26 DIAGNOSIS — Z923 Personal history of irradiation: Secondary | ICD-10-CM

## 2015-03-26 DIAGNOSIS — Z87891 Personal history of nicotine dependence: Secondary | ICD-10-CM | POA: Diagnosis not present

## 2015-03-26 DIAGNOSIS — Z79899 Other long term (current) drug therapy: Secondary | ICD-10-CM | POA: Insufficient documentation

## 2015-03-26 DIAGNOSIS — T451X5S Adverse effect of antineoplastic and immunosuppressive drugs, sequela: Secondary | ICD-10-CM | POA: Diagnosis not present

## 2015-03-26 DIAGNOSIS — Z85841 Personal history of malignant neoplasm of brain: Secondary | ICD-10-CM

## 2015-03-26 DIAGNOSIS — D701 Agranulocytosis secondary to cancer chemotherapy: Secondary | ICD-10-CM

## 2015-03-26 DIAGNOSIS — L271 Localized skin eruption due to drugs and medicaments taken internally: Secondary | ICD-10-CM | POA: Diagnosis not present

## 2015-03-26 DIAGNOSIS — Z8546 Personal history of malignant neoplasm of prostate: Secondary | ICD-10-CM | POA: Diagnosis not present

## 2015-03-26 DIAGNOSIS — K0889 Other specified disorders of teeth and supporting structures: Secondary | ICD-10-CM | POA: Insufficient documentation

## 2015-03-26 DIAGNOSIS — Z5112 Encounter for antineoplastic immunotherapy: Secondary | ICD-10-CM | POA: Insufficient documentation

## 2015-03-26 DIAGNOSIS — C787 Secondary malignant neoplasm of liver and intrahepatic bile duct: Secondary | ICD-10-CM | POA: Diagnosis not present

## 2015-03-26 DIAGNOSIS — T451X5D Adverse effect of antineoplastic and immunosuppressive drugs, subsequent encounter: Secondary | ICD-10-CM

## 2015-03-26 DIAGNOSIS — R1310 Dysphagia, unspecified: Secondary | ICD-10-CM | POA: Diagnosis not present

## 2015-03-26 DIAGNOSIS — C3490 Malignant neoplasm of unspecified part of unspecified bronchus or lung: Secondary | ICD-10-CM

## 2015-03-26 DIAGNOSIS — Z87442 Personal history of urinary calculi: Secondary | ICD-10-CM | POA: Diagnosis not present

## 2015-03-26 DIAGNOSIS — Z7952 Long term (current) use of systemic steroids: Secondary | ICD-10-CM | POA: Insufficient documentation

## 2015-03-26 LAB — COMPREHENSIVE METABOLIC PANEL
ALT: 20 U/L (ref 17–63)
ANION GAP: 6 (ref 5–15)
AST: 27 U/L (ref 15–41)
Albumin: 3.9 g/dL (ref 3.5–5.0)
Alkaline Phosphatase: 101 U/L (ref 38–126)
BILIRUBIN TOTAL: 0.5 mg/dL (ref 0.3–1.2)
BUN: 8 mg/dL (ref 6–20)
CHLORIDE: 103 mmol/L (ref 101–111)
CO2: 27 mmol/L (ref 22–32)
Calcium: 9.2 mg/dL (ref 8.9–10.3)
Creatinine, Ser: 0.75 mg/dL (ref 0.61–1.24)
Glucose, Bld: 79 mg/dL (ref 65–99)
POTASSIUM: 3.8 mmol/L (ref 3.5–5.1)
Sodium: 136 mmol/L (ref 135–145)
TOTAL PROTEIN: 6.8 g/dL (ref 6.5–8.1)

## 2015-03-26 LAB — CBC WITH DIFFERENTIAL/PLATELET
BASOS ABS: 0 10*3/uL (ref 0–0.1)
Basophils Relative: 1 %
EOS PCT: 5 %
Eosinophils Absolute: 0.1 10*3/uL (ref 0–0.7)
HEMATOCRIT: 33.2 % — AB (ref 40.0–52.0)
Hemoglobin: 11.1 g/dL — ABNORMAL LOW (ref 13.0–18.0)
LYMPHS PCT: 41 %
Lymphs Abs: 1.1 10*3/uL (ref 1.0–3.6)
MCH: 30.3 pg (ref 26.0–34.0)
MCHC: 33.5 g/dL (ref 32.0–36.0)
MCV: 90.7 fL (ref 80.0–100.0)
MONO ABS: 0.6 10*3/uL (ref 0.2–1.0)
MONOS PCT: 24 %
NEUTROS ABS: 0.8 10*3/uL — AB (ref 1.4–6.5)
Neutrophils Relative %: 29 %
PLATELETS: 132 10*3/uL — AB (ref 150–440)
RBC: 3.66 MIL/uL — ABNORMAL LOW (ref 4.40–5.90)
RDW: 16.1 % — AB (ref 11.5–14.5)
WBC: 2.7 10*3/uL — ABNORMAL LOW (ref 3.8–10.6)

## 2015-03-26 LAB — MAGNESIUM: MAGNESIUM: 1.8 mg/dL (ref 1.7–2.4)

## 2015-03-26 MED ORDER — SODIUM CHLORIDE 0.9 % IV SOLN
Freq: Once | INTRAVENOUS | Status: AC
  Administered 2015-03-26: 10:00:00 via INTRAVENOUS
  Filled 2015-03-26: qty 1000

## 2015-03-26 MED ORDER — HEPARIN SOD (PORK) LOCK FLUSH 100 UNIT/ML IV SOLN
500.0000 [IU] | Freq: Once | INTRAVENOUS | Status: AC | PRN
  Administered 2015-03-26: 500 [IU]
  Filled 2015-03-26 (×2): qty 5

## 2015-03-26 MED ORDER — SODIUM CHLORIDE 0.9 % IV SOLN
400.0000 mg | Freq: Once | INTRAVENOUS | Status: AC
  Administered 2015-03-26: 400 mg via INTRAVENOUS
  Filled 2015-03-26: qty 20

## 2015-03-26 MED ORDER — SODIUM CHLORIDE 0.9% FLUSH
10.0000 mL | INTRAVENOUS | Status: DC | PRN
  Administered 2015-03-26: 10 mL
  Filled 2015-03-26: qty 10

## 2015-03-26 NOTE — Progress Notes (Signed)
[place] @ [facility] Telephone:(336) [phone]  Fax:(336) [phone]     Kamoni Gentles OB: 31-Dec-1940  MR#: [id number]  JSH#:[id number]  Patient Care Team: No Pcp Per Patient as PCP - General (General Practice) [name], MD (Oncology) [name], MD (General Surgery)  CHIEF COMPLAINT:  No chief complaint on file.  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 MRI scan of brain shows the right parietal lobe lesion (T4, N2, M1 disease) September, 2014 stage IV 3. Stereotactic radiation therapy to the brain been arranged.  Patient was started on carboplatinum and VP-16. 4. Chest radiation started on December, 2014 5. Patient finished radiation to the chest in January of 2015.  Received total 6000 rads 6. Finished consolidation chemotherapy on March 14, 2013 7. PET scan shows progressive disease in the liver.  September, 2015 8. Started on carboplatinum and CPT-11 from November 14, 2013 9.cancer of the mid sigmoid colon.  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 patient is on FOLFOX and vectibix from April of 2016          04/24/2014 Initial Diagnosis Carcinoma of sigmoid colon      INTERVAL HISTORY:  Patient is here for ongoing evaluation and continuation of chemotherapy.  As developed rash on the face secondary to [place].  No chills.  Ppatient is taking occasional doxycycline so rash is more prominent on the head face and neck Occasional nosebleed.  No nausea no vomiting no diarrhea.  No soreness in the mouth.  No tingling.  No numbness. Patient is here for ongoing  evaluation and treatment consideration Complains of  nausea with doxycycline.   ROS Gen. status: Alert oriented not any acute distress. HEENT: No headache.  No dizziness.  No soreness in the mouth.  Pain in the right side of the throat.  Has some difficulty swallowing Wisdom tooth from the left side of the mouth was removed.  Swelling and bleeding  No palpable masses in the neck Lungs: No cough.  No hemoptysis.  No chest pain.  No shortness of breath. Cardiac: No chest pain.  No paroxysmal dyspnea.  No orthopnea.  No palpitation Abdomen: No abdominal pain.  No diarrhea.  No nausea.  No vomiting.  Appetite is stable.  No rectal bleeding. Skin: No ecchymosis.   Marland Kitchenrecurrence  Of rash on face and chest wall area. No itching.  Maculopapular rash on the face Neuro: No headache.  No dizziness.  No weakness in upper or lower extremity.  No evidence of tingling numbness.. Lower extremity no edema As per HPI. Otherwise, a complete review of systems is negatve.  PAST MEDICAL HISTORY: Past Medical History  Diagnosis Date  . Kidney stones 1975  . Lung cancer [facility]) 2014    Radiation therapy  . Liver cancer ([place])   . Colon cancer ([place]) 01/2014  . Prostate cancer [facility]) 2005    treated by Dr Eliberto Ivory  . Brain cancer ([place]) 01/2014    Chemotherapy and radiation treatment    PAST SURGICAL HISTORY: Past Surgical History  Procedure Laterality Date  . Kidney stones  1975  . Prostate surgery  2005    partial removal due  to cancer  . Colon surgery  05-03-14    Resection of Sigmoid Colon and liver biopsy  . Portacath placement  2014    FAMILY HISTORY Family History  Problem Relation Age of Onset  . Lung cancer Father   . Alzheimer's disease Mother        ADVANCED DIRECTIVES: Patient does have advanced health care directive   HEALTH MAINTENANCE: Social History  Substance Use Topics  . Smoking status: Former Smoker -- 35 years  . Smokeless tobacco: Never Used  . Alcohol Use: No      No  Known Allergies  Current Outpatient Prescriptions  Medication Sig Dispense Refill  . ibuprofen (ADVIL,MOTRIN) 200 MG tablet Take 200 mg by mouth every 6 (six) hours as needed.    . lidocaine-prilocaine (EMLA) cream Apply to affected area once 30 g 3  . oxyCODONE (OXY IR/ROXICODONE) 5 MG immediate release tablet Take 5 mg by mouth every 6 (six) hours as needed.     . predniSONE (DELTASONE) 10 MG tablet Take 1 tablet (10 mg total) by mouth daily with breakfast. 30 tablet 0  . PROVENTIL HFA 108 (90 BASE) MCG/ACT inhaler Inhale 90 mcg into the lungs every 6 (six) hours as needed.    . Triamcinolone Acetonide (TRIAMCINOLONE 0.1 % CREAM : EUCERIN) CREA Apply 1 application topically 2 (two) times daily. 1 each 3   No current facility-administered medications for this visit.   Facility-Administered Medications Ordered in Other Visits  Medication Dose Route Frequency Provider Last Rate Last Dose  . sodium chloride 0.9 % injection 10 mL  10 mL Intracatheter PRN [name], MD   10 mL at 07/10/14 1428    OBJECTIVE: There were no vitals filed for this visit.   There is no weight on file to calculate BMI.    ECOG FS:1 - Symptomatic but completely ambulatory  Physical Exam  GENERAL:  Well developed, well nourished, sitting comfortably in the exam room in no acute distress. MENTAL STATUS:  Alert and oriented to person, place and time. HEAD:Normocephalic, atraumatic, face symmetric, no Cushingoid features. EYES:  .  Pupils equal round and reactive to light and accomodation.  No conjunctivitis or scleral icterus. ENT:  Oropharynx clear without lesion.  Tongue normal. Mucous membranes moist. . Patient has pain intermittently on the right side of the throat has enlarged tonsils right is more enlarged than the left I do not see any ulcer or fungating mass RESPIRATORY:  Clear to auscultation without rales, wheezes or rhonchi. CARDIOVASCULAR:  Regular rate and rhythm without murmur, rub or gallop. BREAST:   Right breast without masses, skin changes or nipple discharge.  Left breast without masses, skin changes or nipple discharge. ABDOMEN:  Soft, non-tender, with active bowel sounds, and no hepatosplenomegaly.  No masses. BACK:  No CVA tenderness.  No tenderness on percussion of the back or rib cage. SKIN: Dry scaly skin and the rash on the face. Grade 2 EXTREMITIES: No edema, no skin discoloration or tenderness.  No palpable cords. LYMPH NODES: No palpable cervical, supraclavicular, axillary or inguinal adenopathy  NEUROLOGICAL: Unremarkable. PSYCH:  Appropriate.   LAB RESULTS:    No results found for: SPEP, UPEP  Lab Results  Component Value Date   WBC 2.7* 03/26/2015   NEUTROABS 0.8* 03/26/2015   HGB 11.1* 03/26/2015   HCT 33.2* 03/26/2015   MCV 90.7 03/26/2015   PLT 132* 03/26/2015      Chemistry  STUDIES    ASSESSMENT:   stage IV undifferentiated carcinoma (small cell) of lung There is no evidence of recurrent disease.2.rectal bleeding.  Colonoscopy revealed mid sigmoid colon cancer 2.  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  We will hold off any further chemotherapy. Continue   vectibix  Continue chemotherapy at present time Patient was encouraged to use doxycycline more frequently Carcinoma of sigmoid colon   Staging form: Colon and Rectum, AJCC 7th Edition     Clinical: Stage IVB (yT3, N1a, M1b) - Marni Griffon, MD   03/26/2015 8:52 AM

## 2015-03-26 NOTE — Telephone Encounter (Signed)
No folfox today. MD only wants to give vectibix.

## 2015-03-26 NOTE — Progress Notes (Signed)
Patient states he has an area in his abdomen that is not healing properly.  States he has occasional pain.

## 2015-04-16 ENCOUNTER — Inpatient Hospital Stay: Payer: Medicare Other | Attending: Oncology

## 2015-04-16 ENCOUNTER — Inpatient Hospital Stay: Payer: Medicare Other

## 2015-04-16 ENCOUNTER — Inpatient Hospital Stay (HOSPITAL_BASED_OUTPATIENT_CLINIC_OR_DEPARTMENT_OTHER): Payer: Medicare Other | Admitting: Oncology

## 2015-04-16 ENCOUNTER — Other Ambulatory Visit: Payer: Self-pay | Admitting: *Deleted

## 2015-04-16 ENCOUNTER — Encounter: Payer: Self-pay | Admitting: Oncology

## 2015-04-16 VITALS — BP 131/87 | HR 96 | Temp 95.0°F | Resp 18 | Wt 154.3 lb

## 2015-04-16 DIAGNOSIS — L271 Localized skin eruption due to drugs and medicaments taken internally: Secondary | ICD-10-CM | POA: Diagnosis not present

## 2015-04-16 DIAGNOSIS — Z85118 Personal history of other malignant neoplasm of bronchus and lung: Secondary | ICD-10-CM | POA: Insufficient documentation

## 2015-04-16 DIAGNOSIS — K0889 Other specified disorders of teeth and supporting structures: Secondary | ICD-10-CM

## 2015-04-16 DIAGNOSIS — T451X5D Adverse effect of antineoplastic and immunosuppressive drugs, subsequent encounter: Secondary | ICD-10-CM

## 2015-04-16 DIAGNOSIS — Z9889 Other specified postprocedural states: Secondary | ICD-10-CM | POA: Insufficient documentation

## 2015-04-16 DIAGNOSIS — Z8546 Personal history of malignant neoplasm of prostate: Secondary | ICD-10-CM | POA: Diagnosis not present

## 2015-04-16 DIAGNOSIS — X58XXXS Exposure to other specified factors, sequela: Secondary | ICD-10-CM | POA: Insufficient documentation

## 2015-04-16 DIAGNOSIS — J351 Hypertrophy of tonsils: Secondary | ICD-10-CM | POA: Diagnosis not present

## 2015-04-16 DIAGNOSIS — Z5112 Encounter for antineoplastic immunotherapy: Secondary | ICD-10-CM | POA: Diagnosis not present

## 2015-04-16 DIAGNOSIS — Z923 Personal history of irradiation: Secondary | ICD-10-CM | POA: Insufficient documentation

## 2015-04-16 DIAGNOSIS — C187 Malignant neoplasm of sigmoid colon: Secondary | ICD-10-CM

## 2015-04-16 DIAGNOSIS — Z85841 Personal history of malignant neoplasm of brain: Secondary | ICD-10-CM | POA: Diagnosis not present

## 2015-04-16 DIAGNOSIS — Z79899 Other long term (current) drug therapy: Secondary | ICD-10-CM | POA: Diagnosis not present

## 2015-04-16 DIAGNOSIS — R0602 Shortness of breath: Secondary | ICD-10-CM | POA: Diagnosis not present

## 2015-04-16 DIAGNOSIS — C787 Secondary malignant neoplasm of liver and intrahepatic bile duct: Secondary | ICD-10-CM

## 2015-04-16 DIAGNOSIS — Z87442 Personal history of urinary calculi: Secondary | ICD-10-CM | POA: Insufficient documentation

## 2015-04-16 DIAGNOSIS — Z87891 Personal history of nicotine dependence: Secondary | ICD-10-CM | POA: Diagnosis not present

## 2015-04-16 LAB — MAGNESIUM: Magnesium: 1.7 mg/dL (ref 1.7–2.4)

## 2015-04-16 LAB — CBC WITH DIFFERENTIAL/PLATELET
Basophils Absolute: 0 10*3/uL (ref 0–0.1)
Basophils Relative: 1 %
Eosinophils Absolute: 0.1 10*3/uL (ref 0–0.7)
Eosinophils Relative: 2 %
HEMATOCRIT: 33 % — AB (ref 40.0–52.0)
HEMOGLOBIN: 11 g/dL — AB (ref 13.0–18.0)
LYMPHS ABS: 1.2 10*3/uL (ref 1.0–3.6)
LYMPHS PCT: 27 %
MCH: 29.6 pg (ref 26.0–34.0)
MCHC: 33.2 g/dL (ref 32.0–36.0)
MCV: 89.2 fL (ref 80.0–100.0)
Monocytes Absolute: 0.6 10*3/uL (ref 0.2–1.0)
Monocytes Relative: 14 %
NEUTROS ABS: 2.5 10*3/uL (ref 1.4–6.5)
NEUTROS PCT: 56 %
Platelets: 175 10*3/uL (ref 150–440)
RBC: 3.7 MIL/uL — AB (ref 4.40–5.90)
RDW: 16 % — ABNORMAL HIGH (ref 11.5–14.5)
WBC: 4.5 10*3/uL (ref 3.8–10.6)

## 2015-04-16 LAB — COMPREHENSIVE METABOLIC PANEL
ALT: 14 U/L — AB (ref 17–63)
ANION GAP: 5 (ref 5–15)
AST: 22 U/L (ref 15–41)
Albumin: 3.9 g/dL (ref 3.5–5.0)
Alkaline Phosphatase: 88 U/L (ref 38–126)
BUN: 9 mg/dL (ref 6–20)
CHLORIDE: 105 mmol/L (ref 101–111)
CO2: 26 mmol/L (ref 22–32)
CREATININE: 0.71 mg/dL (ref 0.61–1.24)
Calcium: 8.9 mg/dL (ref 8.9–10.3)
GFR calc non Af Amer: >60 mL/min (ref >60)
Glucose, Bld: 106 mg/dL — ABNORMAL HIGH (ref 65–99)
Potassium: 3.5 mmol/L (ref 3.5–5.1)
SODIUM: 136 mmol/L (ref 135–145)
Total Bilirubin: 0.5 mg/dL (ref 0.3–1.2)
Total Protein: 7.4 g/dL (ref 6.5–8.1)

## 2015-04-16 MED ORDER — SODIUM CHLORIDE 0.9 % IV SOLN
400.0000 mg | Freq: Once | INTRAVENOUS | Status: AC
  Administered 2015-04-16: 400 mg via INTRAVENOUS
  Filled 2015-04-16: qty 20

## 2015-04-16 MED ORDER — SODIUM CHLORIDE 0.9 % IV SOLN
Freq: Once | INTRAVENOUS | Status: AC
  Administered 2015-04-16: 15:00:00 via INTRAVENOUS
  Filled 2015-04-16: qty 1000

## 2015-04-16 MED ORDER — HEPARIN SOD (PORK) LOCK FLUSH 100 UNIT/ML IV SOLN
500.0000 [IU] | Freq: Once | INTRAVENOUS | Status: AC | PRN
  Administered 2015-04-16: 500 [IU]
  Filled 2015-04-16: qty 5

## 2015-04-16 MED ORDER — SODIUM CHLORIDE 0.9 % IV SOLN
1.0000 g | Freq: Once | INTRAVENOUS | Status: AC
  Administered 2015-04-16: 1 g via INTRAVENOUS
  Filled 2015-04-16: qty 2

## 2015-04-16 NOTE — Progress Notes (Signed)
[place] @ [facility] Telephone:(336) [phone]  Fax:(336) [phone]     Squire Withey OB: 1941/01/06  MR#: [id number]  BPP#:[id number]  Patient Care Team: No Pcp Per Patient as PCP - General (General Practice) [name], MD (Oncology) [name], MD (General Surgery)  CHIEF COMPLAINT:  Chief Complaint  Patient presents with  . 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 MRI scan of brain shows the right parietal lobe lesion (T4, N2, M1 disease) September, 2014 stage IV 3. Stereotactic radiation therapy to the brain been arranged.  Patient was started on carboplatinum and VP-16. 4. Chest radiation started on December, 2014 5. Patient finished radiation to the chest in January of 2015.  Received total 6000 rads 6. Finished consolidation chemotherapy on March 14, 2013 7. PET scan shows progressive disease in the liver.  September, 2015 8. Started on carboplatinum and CPT-11 from November 14, 2013 9.cancer of the mid sigmoid colon.  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 patient is on FOLFOX and vectibix from April of 2016  11. repeat CT scan shows complete response so patient has been switched over to   vectibix as a maintenance therapy from February of 2017          04/24/2014 Initial Diagnosis Carcinoma of sigmoid colon      INTERVAL HISTORY:  Patient is here for ongoing evaluation and continuation of chemotherapy.  As developed rash on the face secondary to [place].  No chills.  Ppatient is taking occasional doxycycline so rash is  more prominent on the head face and neck Occasional nosebleed.  No nausea no vomiting no diarrhea.  No soreness in the mouth.  No tingling.  No numbness. Patient is here for ongoing evaluation and treatment consideration Patient is continuing maintenance therapy with  Vectibix.  Grade 1 rash.  Patient is taking doxycycline intermittently.  No nausea no vomiting or diarrhea continues to have mild hypomagnesemia   ROS Gen. status: Alert oriented not any acute distress. HEENT: No headache.  No dizziness.  No soreness in the mouth.  Pain in the right side of the throat.  Has some difficulty swallowing Wisdom tooth from the left side of the mouth was removed.  Swelling and bleeding  No palpable masses in the neck Lungs: No cough.  No hemoptysis.  No chest pain.  No shortness of breath. Cardiac: No chest pain.  No paroxysmal dyspnea.  No orthopnea.  No palpitation Abdomen: No abdominal pain.  No diarrhea.  No nausea.  No vomiting.  Appetite is stable.  No rectal bleeding. Skin: No ecchymosis.   Adam Kitchenrecurrence  Of rash on face and chest wall area. No itching.  Maculopapular rash on the face Neuro: No headache.  No dizziness.  No weakness in upper or lower extremity.  No evidence of tingling numbness.. Lower extremity no edema As per HPI. Otherwise, a complete review of systems is negatve.  PAST MEDICAL HISTORY: Past Medical History  Diagnosis Date  . Kidney stones 1975  . Lung cancer [facility]) 2014    Radiation therapy  . Liver cancer ([place])   . Colon cancer ([place])  01/2014  . Prostate cancer [facility]) 2005    treated by Dr Eliberto Ivory  . Brain cancer ([place]) 01/2014    Chemotherapy and radiation treatment    PAST SURGICAL HISTORY: Past Surgical History  Procedure Laterality Date  . Kidney stones  1975  . Prostate surgery  2005    partial removal due to cancer  . Colon surgery  05-03-14    Resection of Sigmoid Colon and liver biopsy  . Portacath placement  2014    FAMILY HISTORY Family History  Problem  Relation Age of Onset  . Lung cancer Father   . Alzheimer's disease Mother        ADVANCED DIRECTIVES: Patient does have advanced health care directive   HEALTH MAINTENANCE: Social History  Substance Use Topics  . Smoking status: Former Smoker -- 35 years  . Smokeless tobacco: Never Used  . Alcohol Use: No      No Known Allergies  Current Outpatient Prescriptions  Medication Sig Dispense Refill  . ibuprofen (ADVIL,MOTRIN) 200 MG tablet Take 200 mg by mouth every 6 (six) hours as needed.    [name] oxyCODONE (OXY IR/ROXICODONE) 5 MG immediate release tablet Take 5 mg by mouth every 6 (six) hours as needed.     . predniSONE (DELTASONE) 10 MG tablet Take 1 tablet (10 mg total) by mouth daily with breakfast. 30 tablet 0  . PROVENTIL HFA 108 (90 BASE) MCG/ACT inhaler Inhale 90 mcg into the lungs every 6 (six) hours as needed.    . Triamcinolone Acetonide (TRIAMCINOLONE 0.1 % CREAM : EUCERIN) CREA Apply 1 application topically 2 (two) times daily. 1 each 3   No current facility-administered medications for this visit.   Facility-Administered Medications Ordered in Other Visits  Medication Dose Route Frequency Provider Last Rate Last Dose  . sodium chloride 0.9 % injection 10 mL  10 mL Intracatheter PRN [name], MD   10 mL at 07/10/14 1428    OBJECTIVE: Filed Vitals:   04/16/15 1405  BP: 131/87  Pulse: 96  Temp: 95 F (35 C)  Resp: 18     Body mass index is 19.81 kg/(m^2).    ECOG FS:1 - Symptomatic but completely ambulatory  Physical Exam  GENERAL:  Well developed, well nourished, sitting comfortably in the exam room in no acute distress. MENTAL STATUS:  Alert and oriented to person, place and time. HEAD:Normocephalic, atraumatic, face symmetric, no Cushingoid features. EYES:  .  Pupils equal round and reactive to light and accomodation.  No conjunctivitis or scleral icterus. ENT:  Oropharynx clear without lesion.  Tongue normal. Mucous membranes moist. . Patient has  pain intermittently on the right side of the throat has enlarged tonsils right is more enlarged than the left I do not see any ulcer or fungating mass RESPIRATORY:  Clear to auscultation without rales, wheezes or rhonchi. CARDIOVASCULAR:  Regular rate and rhythm without murmur, rub or gallop. BREAST:  Right breast without masses, skin changes or nipple discharge.  Left breast without masses, skin changes or nipple discharge. ABDOMEN:  Soft, non-tender, with active bowel sounds, and no hepatosplenomegaly.  No masses. BACK:  No CVA tenderness.  No tenderness on percussion of the back or rib cage. SKIN: Dry scaly skin and the rash on the face. Grade 1 EXTREMITIES: No edema, no skin discoloration or tenderness.  No palpable cords. LYMPH NODES: No palpable cervical, supraclavicular, axillary or inguinal adenopathy  NEUROLOGICAL: Unremarkable. PSYCH:  Appropriate.   LAB RESULTS:      Lab Results  Component Value Date   WBC 4.5 04/16/2015   NEUTROABS 2.5 04/16/2015   HGB 11.0* 04/16/2015   HCT 33.0* 04/16/2015   MCV 89.2 04/16/2015   PLT 175 04/16/2015      Chemistry             STUDIES   Lab data has been reviewed [place] is 1.7.  The CEA has been pending. Last CEA in January was 5.7   ASSESSMENT:   stage IV undifferentiated carcinoma (small cell) of lung There is no evidence of recurrent disease.2.rectal bleeding.  Colonoscopy revealed mid sigmoid colon cancer 2.  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

## 2015-04-17 ENCOUNTER — Encounter: Payer: Self-pay | Admitting: Oncology

## 2015-05-07 ENCOUNTER — Inpatient Hospital Stay (HOSPITAL_BASED_OUTPATIENT_CLINIC_OR_DEPARTMENT_OTHER): Payer: Medicare Other | Admitting: Oncology

## 2015-05-07 ENCOUNTER — Inpatient Hospital Stay: Payer: Medicare Other

## 2015-05-07 ENCOUNTER — Encounter: Payer: Self-pay | Admitting: Oncology

## 2015-05-07 VITALS — BP 130/88 | HR 91 | Temp 95.3°F | Resp 18 | Wt 157.6 lb

## 2015-05-07 DIAGNOSIS — L271 Localized skin eruption due to drugs and medicaments taken internally: Secondary | ICD-10-CM

## 2015-05-07 DIAGNOSIS — Z8546 Personal history of malignant neoplasm of prostate: Secondary | ICD-10-CM

## 2015-05-07 DIAGNOSIS — Z923 Personal history of irradiation: Secondary | ICD-10-CM | POA: Diagnosis not present

## 2015-05-07 DIAGNOSIS — Z85118 Personal history of other malignant neoplasm of bronchus and lung: Secondary | ICD-10-CM

## 2015-05-07 DIAGNOSIS — C187 Malignant neoplasm of sigmoid colon: Secondary | ICD-10-CM | POA: Diagnosis not present

## 2015-05-07 DIAGNOSIS — Z85841 Personal history of malignant neoplasm of brain: Secondary | ICD-10-CM | POA: Diagnosis not present

## 2015-05-07 DIAGNOSIS — T451X5D Adverse effect of antineoplastic and immunosuppressive drugs, subsequent encounter: Secondary | ICD-10-CM

## 2015-05-07 DIAGNOSIS — Z5112 Encounter for antineoplastic immunotherapy: Secondary | ICD-10-CM | POA: Diagnosis not present

## 2015-05-07 DIAGNOSIS — X58XXXS Exposure to other specified factors, sequela: Secondary | ICD-10-CM

## 2015-05-07 DIAGNOSIS — C787 Secondary malignant neoplasm of liver and intrahepatic bile duct: Secondary | ICD-10-CM

## 2015-05-07 DIAGNOSIS — Z79899 Other long term (current) drug therapy: Secondary | ICD-10-CM

## 2015-05-07 DIAGNOSIS — R0602 Shortness of breath: Secondary | ICD-10-CM

## 2015-05-07 LAB — CBC WITH DIFFERENTIAL/PLATELET
BASOS ABS: 0 10*3/uL (ref 0–0.1)
BASOS PCT: 0 %
Eosinophils Absolute: 0.1 10*3/uL (ref 0–0.7)
Eosinophils Relative: 2 %
HEMATOCRIT: 34.7 % — AB (ref 40.0–52.0)
HEMOGLOBIN: 11.5 g/dL — AB (ref 13.0–18.0)
LYMPHS PCT: 26 %
Lymphs Abs: 1.5 10*3/uL (ref 1.0–3.6)
MCH: 29 pg (ref 26.0–34.0)
MCHC: 33.2 g/dL (ref 32.0–36.0)
MCV: 87.4 fL (ref 80.0–100.0)
MONO ABS: 0.9 10*3/uL (ref 0.2–1.0)
Monocytes Relative: 16 %
NEUTROS ABS: 3.2 10*3/uL (ref 1.4–6.5)
NEUTROS PCT: 56 %
Platelets: 197 10*3/uL (ref 150–440)
RBC: 3.97 MIL/uL — AB (ref 4.40–5.90)
RDW: 15.5 % — AB (ref 11.5–14.5)
WBC: 5.7 10*3/uL (ref 3.8–10.6)

## 2015-05-07 LAB — COMPREHENSIVE METABOLIC PANEL
ALBUMIN: 4.2 g/dL (ref 3.5–5.0)
ALK PHOS: 95 U/L (ref 38–126)
ALT: 18 U/L (ref 17–63)
AST: 25 U/L (ref 15–41)
Anion gap: 6 (ref 5–15)
BILIRUBIN TOTAL: 0.7 mg/dL (ref 0.3–1.2)
BUN: 9 mg/dL (ref 6–20)
CALCIUM: 9 mg/dL (ref 8.9–10.3)
CO2: 26 mmol/L (ref 22–32)
CREATININE: 0.65 mg/dL (ref 0.61–1.24)
Chloride: 104 mmol/L (ref 101–111)
GFR calc Af Amer: >60 mL/min (ref >60)
GLUCOSE: 95 mg/dL (ref 65–99)
POTASSIUM: 4 mmol/L (ref 3.5–5.1)
Sodium: 136 mmol/L (ref 135–145)
TOTAL PROTEIN: 7.4 g/dL (ref 6.5–8.1)

## 2015-05-07 LAB — MAGNESIUM: MAGNESIUM: 1.6 mg/dL — AB (ref 1.7–2.4)

## 2015-05-07 MED ORDER — SODIUM CHLORIDE 0.9 % IV SOLN
Freq: Once | INTRAVENOUS | Status: AC
  Administered 2015-05-07: 14:00:00 via INTRAVENOUS
  Filled 2015-05-07: qty 1000

## 2015-05-07 MED ORDER — SODIUM CHLORIDE 0.9% FLUSH
10.0000 mL | INTRAVENOUS | Status: DC | PRN
  Administered 2015-05-07: 10 mL
  Filled 2015-05-07: qty 10

## 2015-05-07 MED ORDER — MAGNESIUM SULFATE 2 GM/50ML IV SOLN
2.0000 g | Freq: Once | INTRAVENOUS | Status: AC
  Administered 2015-05-07: 2 g via INTRAVENOUS
  Filled 2015-05-07: qty 50

## 2015-05-07 MED ORDER — HEPARIN SOD (PORK) LOCK FLUSH 100 UNIT/ML IV SOLN
500.0000 [IU] | Freq: Once | INTRAVENOUS | Status: AC | PRN
  Administered 2015-05-07: 500 [IU]
  Filled 2015-05-07: qty 5

## 2015-05-07 MED ORDER — PANITUMUMAB CHEMO INJECTION 100 MG/5ML
400.0000 mg | Freq: Once | INTRAVENOUS | Status: AC
  Administered 2015-05-07: 400 mg via INTRAVENOUS
  Filled 2015-05-07: qty 20

## 2015-05-07 NOTE — Progress Notes (Signed)
[place] @ [facility] Telephone:(336) [phone]  Fax:(336) [phone]     Takeshi Teasdale OB: [birth year]  MR#: [id number]  ZHG#:[id number]  Patient Care Team: No Pcp Per Patient as PCP - General (General Practice) [name], MD (Oncology) [name], MD (General Surgery)  CHIEF COMPLAINT:  Chief Complaint  Patient presents with  . 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 MRI scan of brain shows the right parietal lobe lesion (T4, N2, M1 disease) September, 2014 stage IV 3. Stereotactic radiation therapy to the brain been arranged.  Patient was started on carboplatinum and VP-16. 4. Chest radiation started on December, 2014 5. Patient finished radiation to the chest in January of 2015.  Received total 6000 rads 6. Finished consolidation chemotherapy on March 14, 2013 7. PET scan shows progressive disease in the liver.  September, 2015 8. Started on carboplatinum and CPT-11 from November 14, 2013 9.cancer of the mid sigmoid colon.  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 patient is on FOLFOX and vectibix from April of 2016  11. repeat CT scan shows complete response so patient has been switched over to   vectibix as a maintenance therapy from February of 2017          04/24/2014 Initial Diagnosis Carcinoma of sigmoid colon      INTERVAL HISTORY:  Patient is here for ongoing evaluation and continuation of chemotherapy.  As developed rash on the face secondary to [place].  No chills.  Ppatient is taking occasional doxycycline so rash is  more prominent on the head face and neck Occasional nosebleed.  No nausea no vomiting no diarrhea.  No soreness in the mouth.  No tingling.  No numbness. Patient is here for ongoing evaluation and treatment consideration Patient is continuing maintenance therapy with  Vectibix.  Grade 1 rash.  Patient is taking doxycycline intermittently.  No nausea no vomiting or diarrhea continues to have mild hypomagnesemia Patient is here for ongoing evaluation and continuation of treatment.  Except for rash and occasional shortness of breath patient does not have any significant complaint.  ROS Gen. status: Alert oriented not any acute distress. HEENT: No headache.  No dizziness.  No soreness in the mouth.  Pain in the right side of the throat.  Has some difficulty swallowing Wisdom tooth from the left side of the mouth was removed.  Swelling and bleeding  No palpable masses in the neck Lungs: No cough.  No hemoptysis.  No chest pain.  No shortness of breath. Cardiac: No chest pain.  No paroxysmal dyspnea.  No orthopnea.  No palpitation Abdomen: No abdominal pain.  No diarrhea.  No nausea.  No vomiting.  Appetite is stable.  No rectal bleeding. Skin: No ecchymosis.   Marland Kitchenrecurrence  Of rash on face and chest wall area. No itching.  Maculopapular rash on the face Neuro: No headache.  No dizziness.  No weakness in upper or lower extremity.  No evidence of tingling numbness.. Lower extremity no edema As per HPI. Otherwise, a complete review of systems is negatve.  PAST MEDICAL HISTORY: Past Medical History  Diagnosis Date  .  Kidney stones 1975  . Lung cancer [facility]) 2014    Radiation therapy  . Liver cancer ([place])   . Colon cancer ([place]) 01/2014  . Prostate cancer [facility]) 2005    treated by Dr Eliberto Ivory  . Brain cancer ([place]) 01/2014    Chemotherapy and radiation treatment    PAST SURGICAL HISTORY: Past Surgical History  Procedure Laterality Date  . Kidney stones  1975  . Prostate surgery  2005    partial  removal due to cancer  . Colon surgery  05-03-14    Resection of Sigmoid Colon and liver biopsy  . Portacath placement  2014    FAMILY HISTORY Family History  Problem Relation Age of Onset  . Lung cancer Father   . Alzheimer's disease Mother        ADVANCED DIRECTIVES: Patient does have advanced health care directive   HEALTH MAINTENANCE: Social History  Substance Use Topics  . Smoking status: Former Smoker -- 35 years  . Smokeless tobacco: Never Used  . Alcohol Use: No      No Known Allergies  Current Outpatient Prescriptions  Medication Sig Dispense Refill  . ibuprofen (ADVIL,MOTRIN) 200 MG tablet Take 200 mg by mouth every 6 (six) hours as needed.    Marland Kitchen oxyCODONE (OXY IR/ROXICODONE) 5 MG immediate release tablet Take 5 mg by mouth every 6 (six) hours as needed.     . predniSONE (DELTASONE) 10 MG tablet Take 1 tablet (10 mg total) by mouth daily with breakfast. 30 tablet 0  . PROVENTIL HFA 108 (90 BASE) MCG/ACT inhaler Inhale 90 mcg into the lungs every 6 (six) hours as needed.    . Triamcinolone Acetonide (TRIAMCINOLONE 0.1 % CREAM : EUCERIN) CREA Apply 1 application topically 2 (two) times daily. 1 each 3   No current facility-administered medications for this visit.   Facility-Administered Medications Ordered in Other Visits  Medication Dose Route Frequency Provider Last Rate Last Dose  . sodium chloride 0.9 % injection 10 mL  10 mL Intracatheter PRN [name], MD   10 mL at 07/10/14 1428    OBJECTIVE: Filed Vitals:   05/07/15 1344  BP: 130/88  Pulse: 91  Temp: 95.3 F (35.2 C)  Resp: 18     Body mass index is 20.23 kg/(m^2).    ECOG FS:1 - Symptomatic but completely ambulatory  Physical Exam  GENERAL:  Well developed, well nourished, sitting comfortably in the exam room in no acute distress. MENTAL STATUS:  Alert and oriented to person, place and time. HEAD:Normocephalic, atraumatic, face symmetric, no Cushingoid features. EYES:  .  Pupils equal round  and reactive to light and accomodation.  No conjunctivitis or scleral icterus. ENT:  Oropharynx clear without lesion.  Tongue normal. Mucous membranes moist. . Patient has pain intermittently on the right side of the throat has enlarged tonsils right is more enlarged than the left I do not see any ulcer or fungating mass RESPIRATORY:  Clear to auscultation without rales, wheezes or rhonchi. CARDIOVASCULAR:  Regular rate and rhythm without murmur, rub or gallop. BREAST:  Right breast without masses, skin changes or nipple discharge.  Left breast without masses, skin changes or nipple discharge. ABDOMEN:  Soft, non-tender, with active bowel sounds, and no hepatosplenomegaly.  No masses. BACK:  No CVA tenderness.  No tenderness on percussion of the back or rib cage. SKIN: Dry scaly skin and the rash on the face. Grade 1 EXTREMITIES: No edema, no skin discoloration or tenderness.  No palpable cords. LYMPH NODES:  No palpable cervical, supraclavicular, axillary or inguinal adenopathy  NEUROLOGICAL: Unremarkable. PSYCH:  Appropriate.   LAB RESULTS:      Lab Results  Component Value Date   WBC 5.7 05/07/2015   NEUTROABS 3.2 05/07/2015   HGB 11.5* 05/07/2015   HCT 34.7* 05/07/2015   MCV 87.4 05/07/2015   PLT 197 05/07/2015      Chemistry             STUDIES   Lab data has been reviewed Mg is 1.6  We will replace magnesium Last CEA in January was 5.7   ASSESSMENT:   stage IV undifferentiated carcinoma (small cell) of lung There is no evidence of recurrent disease.2.rectal bleeding.  Colonoscopy revealed mid sigmoid colon cancer 2.  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

## 2015-05-08 LAB — CEA: CEA: 10.6 ng/mL — AB (ref 0.0–4.7)

## 2015-05-28 ENCOUNTER — Inpatient Hospital Stay: Payer: Medicare Other | Attending: Oncology

## 2015-05-28 ENCOUNTER — Inpatient Hospital Stay: Payer: Medicare Other

## 2015-05-28 VITALS — BP 128/77 | HR 99 | Temp 94.5°F | Resp 20

## 2015-05-28 DIAGNOSIS — C787 Secondary malignant neoplasm of liver and intrahepatic bile duct: Secondary | ICD-10-CM | POA: Diagnosis not present

## 2015-05-28 DIAGNOSIS — C187 Malignant neoplasm of sigmoid colon: Secondary | ICD-10-CM

## 2015-05-28 DIAGNOSIS — Z85118 Personal history of other malignant neoplasm of bronchus and lung: Secondary | ICD-10-CM | POA: Insufficient documentation

## 2015-05-28 DIAGNOSIS — Z923 Personal history of irradiation: Secondary | ICD-10-CM | POA: Diagnosis not present

## 2015-05-28 DIAGNOSIS — Z5112 Encounter for antineoplastic immunotherapy: Secondary | ICD-10-CM | POA: Diagnosis not present

## 2015-05-28 DIAGNOSIS — Z85841 Personal history of malignant neoplasm of brain: Secondary | ICD-10-CM | POA: Insufficient documentation

## 2015-05-28 LAB — COMPREHENSIVE METABOLIC PANEL
ALT: 16 U/L — ABNORMAL LOW (ref 17–63)
ANION GAP: 5 (ref 5–15)
AST: 25 U/L (ref 15–41)
Albumin: 3.9 g/dL (ref 3.5–5.0)
Alkaline Phosphatase: 88 U/L (ref 38–126)
BUN: 13 mg/dL (ref 6–20)
CALCIUM: 8.8 mg/dL — AB (ref 8.9–10.3)
CHLORIDE: 107 mmol/L (ref 101–111)
CO2: 26 mmol/L (ref 22–32)
Creatinine, Ser: 0.71 mg/dL (ref 0.61–1.24)
GFR calc non Af Amer: >60 mL/min (ref >60)
Glucose, Bld: 131 mg/dL — ABNORMAL HIGH (ref 65–99)
POTASSIUM: 3.3 mmol/L — AB (ref 3.5–5.1)
SODIUM: 138 mmol/L (ref 135–145)
Total Bilirubin: 0.4 mg/dL (ref 0.3–1.2)
Total Protein: 7 g/dL (ref 6.5–8.1)

## 2015-05-28 LAB — CBC WITH DIFFERENTIAL/PLATELET
BASOS PCT: 0 %
Basophils Absolute: 0 10*3/uL (ref 0–0.1)
EOS ABS: 0.2 10*3/uL (ref 0–0.7)
EOS PCT: 3 %
HCT: 33.5 % — ABNORMAL LOW (ref 40.0–52.0)
Hemoglobin: 11.3 g/dL — ABNORMAL LOW (ref 13.0–18.0)
LYMPHS ABS: 1.2 10*3/uL (ref 1.0–3.6)
Lymphocytes Relative: 23 %
MCH: 28.9 pg (ref 26.0–34.0)
MCHC: 33.6 g/dL (ref 32.0–36.0)
MCV: 85.8 fL (ref 80.0–100.0)
MONOS PCT: 9 %
Monocytes Absolute: 0.5 10*3/uL (ref 0.2–1.0)
Neutro Abs: 3.3 10*3/uL (ref 1.4–6.5)
Neutrophils Relative %: 65 %
PLATELETS: 190 10*3/uL (ref 150–440)
RBC: 3.9 MIL/uL — ABNORMAL LOW (ref 4.40–5.90)
RDW: 15.8 % — AB (ref 11.5–14.5)
WBC: 5.1 10*3/uL (ref 3.8–10.6)

## 2015-05-28 LAB — MAGNESIUM: Magnesium: 1.7 mg/dL (ref 1.7–2.4)

## 2015-05-28 MED ORDER — HEPARIN SOD (PORK) LOCK FLUSH 100 UNIT/ML IV SOLN
500.0000 [IU] | Freq: Once | INTRAVENOUS | Status: AC | PRN
  Administered 2015-05-28: 500 [IU]
  Filled 2015-05-28: qty 5

## 2015-05-28 MED ORDER — SODIUM CHLORIDE 0.9% FLUSH
10.0000 mL | INTRAVENOUS | Status: DC | PRN
  Administered 2015-05-28: 10 mL
  Filled 2015-05-28: qty 10

## 2015-05-28 MED ORDER — MAGNESIUM SULFATE 2 GM/50ML IV SOLN
2.0000 g | Freq: Once | INTRAVENOUS | Status: AC
  Administered 2015-05-28: 2 g via INTRAVENOUS
  Filled 2015-05-28: qty 50

## 2015-05-28 MED ORDER — SODIUM CHLORIDE 0.9 % IV SOLN
Freq: Once | INTRAVENOUS | Status: AC
  Administered 2015-05-28: 10:00:00 via INTRAVENOUS
  Filled 2015-05-28: qty 1000

## 2015-05-28 MED ORDER — SODIUM CHLORIDE 0.9 % IV SOLN
400.0000 mg | Freq: Once | INTRAVENOUS | Status: AC
  Administered 2015-05-28: 400 mg via INTRAVENOUS
  Filled 2015-05-28: qty 20

## 2015-06-18 ENCOUNTER — Inpatient Hospital Stay: Payer: Medicare Other | Attending: Oncology

## 2015-06-18 ENCOUNTER — Encounter: Payer: Self-pay | Admitting: Oncology

## 2015-06-18 ENCOUNTER — Inpatient Hospital Stay (HOSPITAL_BASED_OUTPATIENT_CLINIC_OR_DEPARTMENT_OTHER): Payer: Medicare Other | Admitting: Oncology

## 2015-06-18 ENCOUNTER — Inpatient Hospital Stay: Payer: Medicare Other

## 2015-06-18 VITALS — BP 129/84 | HR 83 | Temp 95.5°F | Resp 18 | Wt 159.4 lb

## 2015-06-18 DIAGNOSIS — Z7952 Long term (current) use of systemic steroids: Secondary | ICD-10-CM

## 2015-06-18 DIAGNOSIS — Z85841 Personal history of malignant neoplasm of brain: Secondary | ICD-10-CM | POA: Insufficient documentation

## 2015-06-18 DIAGNOSIS — R040 Epistaxis: Secondary | ICD-10-CM | POA: Diagnosis not present

## 2015-06-18 DIAGNOSIS — C787 Secondary malignant neoplasm of liver and intrahepatic bile duct: Secondary | ICD-10-CM | POA: Insufficient documentation

## 2015-06-18 DIAGNOSIS — Z5112 Encounter for antineoplastic immunotherapy: Secondary | ICD-10-CM | POA: Insufficient documentation

## 2015-06-18 DIAGNOSIS — C187 Malignant neoplasm of sigmoid colon: Secondary | ICD-10-CM

## 2015-06-18 DIAGNOSIS — R970 Elevated carcinoembryonic antigen [CEA]: Secondary | ICD-10-CM | POA: Diagnosis not present

## 2015-06-18 DIAGNOSIS — Z923 Personal history of irradiation: Secondary | ICD-10-CM | POA: Insufficient documentation

## 2015-06-18 DIAGNOSIS — Z85118 Personal history of other malignant neoplasm of bronchus and lung: Secondary | ICD-10-CM

## 2015-06-18 DIAGNOSIS — R0602 Shortness of breath: Secondary | ICD-10-CM | POA: Insufficient documentation

## 2015-06-18 DIAGNOSIS — Z79899 Other long term (current) drug therapy: Secondary | ICD-10-CM

## 2015-06-18 DIAGNOSIS — Z87891 Personal history of nicotine dependence: Secondary | ICD-10-CM

## 2015-06-18 DIAGNOSIS — Z8546 Personal history of malignant neoplasm of prostate: Secondary | ICD-10-CM

## 2015-06-18 DIAGNOSIS — R21 Rash and other nonspecific skin eruption: Secondary | ICD-10-CM | POA: Diagnosis not present

## 2015-06-18 LAB — CBC WITH DIFFERENTIAL/PLATELET
BASOS ABS: 0 10*3/uL (ref 0–0.1)
BASOS PCT: 0 %
Eosinophils Absolute: 0.2 10*3/uL (ref 0–0.7)
Eosinophils Relative: 4 %
HEMATOCRIT: 34.6 % — AB (ref 40.0–52.0)
Hemoglobin: 11.5 g/dL — ABNORMAL LOW (ref 13.0–18.0)
LYMPHS PCT: 31 %
Lymphs Abs: 1.7 10*3/uL (ref 1.0–3.6)
MCH: 27.9 pg (ref 26.0–34.0)
MCHC: 33.3 g/dL (ref 32.0–36.0)
MCV: 83.8 fL (ref 80.0–100.0)
Monocytes Absolute: 0.7 10*3/uL (ref 0.2–1.0)
Monocytes Relative: 13 %
NEUTROS ABS: 2.7 10*3/uL (ref 1.4–6.5)
NEUTROS PCT: 52 %
Platelets: 199 10*3/uL (ref 150–440)
RBC: 4.13 MIL/uL — AB (ref 4.40–5.90)
RDW: 15.3 % — ABNORMAL HIGH (ref 11.5–14.5)
WBC: 5.3 10*3/uL (ref 3.8–10.6)

## 2015-06-18 LAB — COMPREHENSIVE METABOLIC PANEL
ALBUMIN: 4.1 g/dL (ref 3.5–5.0)
ALT: 17 U/L (ref 17–63)
ANION GAP: 6 (ref 5–15)
AST: 28 U/L (ref 15–41)
Alkaline Phosphatase: 94 U/L (ref 38–126)
BILIRUBIN TOTAL: 0.6 mg/dL (ref 0.3–1.2)
BUN: 7 mg/dL (ref 6–20)
CALCIUM: 9.3 mg/dL (ref 8.9–10.3)
CO2: 26 mmol/L (ref 22–32)
Chloride: 105 mmol/L (ref 101–111)
Creatinine, Ser: 0.61 mg/dL (ref 0.61–1.24)
GFR calc Af Amer: >60 mL/min (ref >60)
Glucose, Bld: 96 mg/dL (ref 65–99)
POTASSIUM: 3.9 mmol/L (ref 3.5–5.1)
Sodium: 137 mmol/L (ref 135–145)
TOTAL PROTEIN: 7.4 g/dL (ref 6.5–8.1)

## 2015-06-18 LAB — MAGNESIUM: Magnesium: 1.7 mg/dL (ref 1.7–2.4)

## 2015-06-18 MED ORDER — SODIUM CHLORIDE 0.9 % IV SOLN
400.0000 mg | Freq: Once | INTRAVENOUS | Status: AC
  Administered 2015-06-18: 400 mg via INTRAVENOUS
  Filled 2015-06-18: qty 20

## 2015-06-18 MED ORDER — SODIUM CHLORIDE 0.9 % IV SOLN
Freq: Once | INTRAVENOUS | Status: AC
  Administered 2015-06-18: 14:00:00 via INTRAVENOUS
  Filled 2015-06-18: qty 1000

## 2015-06-18 MED ORDER — HEPARIN SOD (PORK) LOCK FLUSH 100 UNIT/ML IV SOLN
500.0000 [IU] | Freq: Once | INTRAVENOUS | Status: AC
  Administered 2015-06-18: 500 [IU] via INTRAVENOUS
  Filled 2015-06-18: qty 5

## 2015-06-18 MED ORDER — SODIUM CHLORIDE 0.9% FLUSH
10.0000 mL | Freq: Once | INTRAVENOUS | Status: AC
  Administered 2015-06-18: 10 mL via INTRAVENOUS
  Filled 2015-06-18: qty 10

## 2015-06-18 MED ORDER — DOXYCYCLINE HYCLATE 100 MG PO TABS: 100.0000 mg | ORAL_TABLET | Freq: Two times a day (BID) | ORAL | Status: DC

## 2015-06-19 LAB — CEA: CEA: 42.6 ng/mL — ABNORMAL HIGH (ref 0.0–4.7)

## 2015-06-23 ENCOUNTER — Encounter: Payer: Self-pay | Admitting: Oncology

## 2015-06-23 NOTE — Progress Notes (Signed)
[place] @ [facility] [phone]  [phone]     [name] OB: August 12, 1940  MR#: [id number]  CBJ#:[id number]  Patient Care Team: No Pcp Per Patient as PCP - General (General Practice) [name], MD (Oncology) [name], MD (General Surgery)  CHIEF COMPLAINT:  Chief Complaint  Patient presents with  . 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 MRI scan of brain shows the right parietal lobe lesion (T4, N2, M1 disease) September, 2014 stage IV 3. Stereotactic radiation therapy to the brain been arranged.  Patient was started on carboplatinum and VP-16. 4. Chest radiation started on December, 2014 5. Patient finished radiation to the chest in January of 2015.  Received total 6000 rads 6. Finished consolidation chemotherapy on March 14, 2013 7. PET scan shows progressive disease in the liver.  September, 2015 8. Started on carboplatinum and CPT-11 from November 14, 2013 9.cancer of the mid sigmoid colon.  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 patient is on FOLFOX and vectibix from April of 2016  11. repeat CT scan shows complete response so patient has been switched over to   vectibix as a maintenance therapy from February of 2017          04/24/2014 Initial Diagnosis Carcinoma of sigmoid colon      INTERVAL HISTORY:  Patient is here for ongoing evaluation and continuation of chemotherapy.  As developed rash on the face secondary to [place].  No chills.  Ppatient is taking occasional doxycycline so rash is more prominent  on the head face and neck Occasional nosebleed.  No nausea no vomiting no diarrhea.  No soreness in the mouth.  No tingling.  No numbness. Patient is here for ongoing evaluation and treatment consideration Patient is continuing maintenance therapy with  Vectibix.  Grade 1 rash.  Patient is taking doxycycline intermittently.  No nausea no vomiting or diarrhea continues to have mild hypomagnesemia Patient is here for ongoing evaluation and continuation of treatment.  Except for rash and occasional shortness of breath patient does not have any significant complaint. Patient is getting maintenance  vectibix  ROS Gen. status: Alert oriented not any acute distress. HEENT: No headache.  No dizziness.  No soreness in the mouth.  Pain in the right side of the throat.  Has some difficulty swallowing Wisdom tooth from the left side of the mouth was removed.  Swelling and bleeding  No palpable masses in the neck Lungs: No cough.  No hemoptysis.  No chest pain.  No shortness of breath. Cardiac: No chest pain.  No paroxysmal dyspnea.  No orthopnea.  No palpitation Abdomen: No abdominal pain.  No diarrhea.  No nausea.  No vomiting.  Appetite is stable.  No rectal bleeding. Skin: No ecchymosis.   Marland Kitchenrecurrence  Of rash on face and chest wall area. No itching.  Maculopapular rash on the face Neuro: No headache.  No dizziness.  No weakness in upper or lower extremity.  No evidence of tingling numbness.. Lower extremity no edema As per HPI. Otherwise, a complete review of systems is negatve.  PAST MEDICAL HISTORY: Past Medical History  Diagnosis Date  . Kidney stones 1975  . Lung cancer [facility]) 2014    Radiation therapy  . Liver cancer ([place])   . Colon cancer ([place]) 01/2014  . Prostate cancer [facility]) 2005    treated by Dr Eliberto Ivory  . Brain cancer ([place]) 01/2014    Chemotherapy and radiation treatment    PAST SURGICAL HISTORY: Past Surgical History  Procedure Laterality Date  . Kidney stones  1975  . Prostate  surgery  2005    partial removal due to cancer  . Colon surgery  05-03-14    Resection of Sigmoid Colon and liver biopsy  . Portacath placement  2014    FAMILY HISTORY Family History  Problem Relation Age of Onset  . Lung cancer Father   . Alzheimer's disease Mother        ADVANCED DIRECTIVES: Patient does have advanced health care directive   HEALTH MAINTENANCE: Social History  Substance Use Topics  . Smoking status: Former Smoker -- 35 years  . Smokeless tobacco: Never Used  . Alcohol Use: No      No Known Allergies  Current Outpatient Prescriptions  Medication Sig Dispense Refill  . ibuprofen (ADVIL,MOTRIN) 200 MG tablet Take 200 mg by mouth every 6 (six) hours as needed.    Marland Kitchen oxyCODONE (OXY IR/ROXICODONE) 5 MG immediate release tablet Take 5 mg by mouth every 6 (six) hours as needed.     . predniSONE (DELTASONE) 10 MG tablet Take 1 tablet (10 mg total) by mouth daily with breakfast. 30 tablet 0  . PROVENTIL HFA 108 (90 BASE) MCG/ACT inhaler Inhale 90 mcg into the lungs every 6 (six) hours as needed.    . Triamcinolone Acetonide (TRIAMCINOLONE 0.1 % CREAM : EUCERIN) CREA Apply 1 application topically 2 (two) times daily. 1 each 3  . doxycycline (VIBRA-TABS) 100 MG tablet Take 1 tablet (100 mg total) by mouth 2 (two) times daily. 60 tablet 3   No current facility-administered medications for this visit.   Facility-Administered Medications Ordered in Other Visits  Medication Dose Route Frequency Provider Last Rate Last Dose  . sodium chloride 0.9 % injection 10 mL  10 mL Intracatheter PRN [name], MD   10 mL at 07/10/14 1428    OBJECTIVE: Filed Vitals:   06/18/15 1356  BP: 129/84  Pulse: 83  Temp: 95.5 F (35.3 C)  Resp: 18     Body mass index is 20.45 kg/(m^2).    ECOG FS:1 - Symptomatic but completely ambulatory  Physical Exam  GENERAL:  Well developed, well nourished, sitting comfortably in the exam room in no acute distress. MENTAL STATUS:  Alert  and oriented to person, place and time. HEAD:Normocephalic, atraumatic, face symmetric, no Cushingoid features. EYES:  .  Pupils equal round and reactive to light and accomodation.  No conjunctivitis or scleral icterus. ENT:  Oropharynx clear without lesion.  Tongue normal. Mucous membranes moist. . Patient has pain intermittently on the right side of the throat has enlarged tonsils right is more enlarged than the left I do not see any ulcer or fungating mass RESPIRATORY:  Clear to auscultation without rales, wheezes or rhonchi. CARDIOVASCULAR:  Regular rate and rhythm without murmur, rub or gallop. BREAST:  Right breast without masses, skin changes or nipple discharge.  Left breast without masses, skin changes or nipple discharge. ABDOMEN:  Soft, non-tender, with active bowel sounds, and no hepatosplenomegaly.  No masses. BACK:  No CVA tenderness.  No tenderness on percussion of the back or rib cage.  SKIN: Dry scaly skin and the rash on the face. Grade 1 EXTREMITIES: No edema, no skin discoloration or tenderness.  No palpable cords. LYMPH NODES: No palpable cervical, supraclavicular, axillary or inguinal adenopathy  NEUROLOGICAL: Unremarkable. PSYCH:  Appropriate.   LAB RESULTS:      Lab Results  Component Value Date   WBC 5.3 06/18/2015   NEUTROABS 2.7 06/18/2015   HGB 11.5* 06/18/2015   HCT 34.6* 06/18/2015   MCV 83.8 06/18/2015   PLT 199 06/18/2015      Chemistry             STUDIES   Lab data has been reviewed Mg is 1.6  We will replace magnesium Last CEA in January was 5.7   ASSESSMENT:   stage IV undifferentiated carcinoma (small cell) of lung There is no evidence of recurrent disease.2.rectal bleeding.  Colonoscopy revealed mid sigmoid colon cancer 2.  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

## 2015-07-07 ENCOUNTER — Ambulatory Visit
Admission: RE | Admit: 2015-07-07 | Discharge: 2015-07-07 | Disposition: A | Discharge status: Home / Self Care | Payer: Medicare Other | Source: Ambulatory Visit | Attending: Oncology | Admitting: Oncology

## 2015-07-07 DIAGNOSIS — C187 Malignant neoplasm of sigmoid colon: Secondary | ICD-10-CM | POA: Diagnosis not present

## 2015-07-07 DIAGNOSIS — C189 Malignant neoplasm of colon, unspecified: Secondary | ICD-10-CM | POA: Diagnosis not present

## 2015-07-07 DIAGNOSIS — C787 Secondary malignant neoplasm of liver and intrahepatic bile duct: Secondary | ICD-10-CM | POA: Diagnosis not present

## 2015-07-07 LAB — GLUCOSE, CAPILLARY: GLUCOSE-CAPILLARY: 68 mg/dL (ref 65–99)

## 2015-07-07 MED ORDER — FLUDEOXYGLUCOSE F - 18 (FDG) INJECTION
10.9700 | Freq: Once | INTRAVENOUS | Status: AC | PRN
  Administered 2015-07-07: 10.97 via INTRAVENOUS

## 2015-07-09 ENCOUNTER — Inpatient Hospital Stay: Payer: Medicare Other | Admitting: Oncology

## 2015-07-09 ENCOUNTER — Inpatient Hospital Stay: Payer: Medicare Other

## 2015-07-10 ENCOUNTER — Inpatient Hospital Stay: Payer: Medicare Other

## 2015-07-10 ENCOUNTER — Inpatient Hospital Stay (HOSPITAL_BASED_OUTPATIENT_CLINIC_OR_DEPARTMENT_OTHER): Payer: Medicare Other | Admitting: Oncology

## 2015-07-10 ENCOUNTER — Encounter: Payer: Self-pay | Admitting: Oncology

## 2015-07-10 VITALS — BP 128/82 | HR 93 | Temp 94.4°F | Resp 18 | Wt 161.2 lb

## 2015-07-10 DIAGNOSIS — C187 Malignant neoplasm of sigmoid colon: Secondary | ICD-10-CM

## 2015-07-10 DIAGNOSIS — Z923 Personal history of irradiation: Secondary | ICD-10-CM

## 2015-07-10 DIAGNOSIS — C787 Secondary malignant neoplasm of liver and intrahepatic bile duct: Secondary | ICD-10-CM | POA: Diagnosis not present

## 2015-07-10 DIAGNOSIS — C3490 Malignant neoplasm of unspecified part of unspecified bronchus or lung: Secondary | ICD-10-CM

## 2015-07-10 DIAGNOSIS — Z87891 Personal history of nicotine dependence: Secondary | ICD-10-CM

## 2015-07-10 DIAGNOSIS — Z5112 Encounter for antineoplastic immunotherapy: Secondary | ICD-10-CM | POA: Diagnosis not present

## 2015-07-10 DIAGNOSIS — Z85841 Personal history of malignant neoplasm of brain: Secondary | ICD-10-CM | POA: Diagnosis not present

## 2015-07-10 DIAGNOSIS — Z8546 Personal history of malignant neoplasm of prostate: Secondary | ICD-10-CM

## 2015-07-10 DIAGNOSIS — Z7952 Long term (current) use of systemic steroids: Secondary | ICD-10-CM

## 2015-07-10 DIAGNOSIS — R21 Rash and other nonspecific skin eruption: Secondary | ICD-10-CM

## 2015-07-10 DIAGNOSIS — R970 Elevated carcinoembryonic antigen [CEA]: Secondary | ICD-10-CM

## 2015-07-10 DIAGNOSIS — Z85118 Personal history of other malignant neoplasm of bronchus and lung: Secondary | ICD-10-CM

## 2015-07-10 DIAGNOSIS — Z79899 Other long term (current) drug therapy: Secondary | ICD-10-CM

## 2015-07-10 LAB — CBC WITH DIFFERENTIAL/PLATELET
BASOS PCT: 0 %
Basophils Absolute: 0 10*3/uL (ref 0–0.1)
EOS ABS: 0.3 10*3/uL (ref 0–0.7)
EOS PCT: 5 %
HCT: 38.2 % — ABNORMAL LOW (ref 40.0–52.0)
HEMOGLOBIN: 13 g/dL (ref 13.0–18.0)
LYMPHS ABS: 1.6 10*3/uL (ref 1.0–3.6)
Lymphocytes Relative: 28 %
MCH: 28.6 pg (ref 26.0–34.0)
MCHC: 33.9 g/dL (ref 32.0–36.0)
MCV: 84.3 fL (ref 80.0–100.0)
MONOS PCT: 9 %
Monocytes Absolute: 0.5 10*3/uL (ref 0.2–1.0)
NEUTROS PCT: 58 %
Neutro Abs: 3.2 10*3/uL (ref 1.4–6.5)
PLATELETS: 222 10*3/uL (ref 150–440)
RBC: 4.53 MIL/uL (ref 4.40–5.90)
RDW: 16.1 % — AB (ref 11.5–14.5)
WBC: 5.7 10*3/uL (ref 3.8–10.6)

## 2015-07-10 LAB — COMPREHENSIVE METABOLIC PANEL
ALBUMIN: 4.2 g/dL (ref 3.5–5.0)
ALT: 17 U/L (ref 17–63)
ANION GAP: 7 (ref 5–15)
AST: 32 U/L (ref 15–41)
Alkaline Phosphatase: 99 U/L (ref 38–126)
BUN: 9 mg/dL (ref 6–20)
CHLORIDE: 103 mmol/L (ref 101–111)
CO2: 29 mmol/L (ref 22–32)
Calcium: 9.5 mg/dL (ref 8.9–10.3)
Creatinine, Ser: 0.82 mg/dL (ref 0.61–1.24)
GFR calc non Af Amer: >60 mL/min (ref >60)
GLUCOSE: 101 mg/dL — AB (ref 65–99)
POTASSIUM: 4 mmol/L (ref 3.5–5.1)
SODIUM: 139 mmol/L (ref 135–145)
TOTAL PROTEIN: 7.6 g/dL (ref 6.5–8.1)
Total Bilirubin: 0.6 mg/dL (ref 0.3–1.2)

## 2015-07-10 LAB — MAGNESIUM: MAGNESIUM: 1.7 mg/dL (ref 1.7–2.4)

## 2015-07-10 NOTE — Progress Notes (Signed)
Patient wants to know if he can get a generic of doxycycline(  His rx is costing $168.

## 2015-07-10 NOTE — Progress Notes (Signed)
Pt was educated regarding new oral chemo, lonsurf. Neurosurgeon from [email or web address] given to patient regarding how to take med, side effects, and when to call clinic. Informed consent has been obtained for lonsurf. Pt verbalized understanding.

## 2015-07-11 ENCOUNTER — Telehealth: Payer: Self-pay | Admitting: *Deleted

## 2015-07-11 ENCOUNTER — Inpatient Hospital Stay: Payer: Medicare Other

## 2015-07-11 ENCOUNTER — Encounter: Payer: Self-pay | Admitting: Oncology

## 2015-07-11 DIAGNOSIS — C187 Malignant neoplasm of sigmoid colon: Secondary | ICD-10-CM

## 2015-07-11 MED ORDER — TRIFLURIDINE-TIPIRACIL 15-6.14 MG PO TABS: ORAL_TABLET | ORAL | Status: DC

## 2015-07-11 MED ORDER — TRIFLURIDINE-TIPIRACIL 15-6.14 MG PO TABS
ORAL_TABLET | ORAL | Status: DC
Start: 2015-07-11 — End: 2015-07-11

## 2015-07-11 MED ORDER — TRIFLURIDINE-TIPIRACIL 20-8.19 MG PO TABS: ORAL_TABLET | ORAL | Status: DC

## 2015-07-11 MED ORDER — TRIFLURIDINE-TIPIRACIL 20-8.19 MG PO TABS
ORAL_TABLET | ORAL | Status: DC
Start: 2015-07-11 — End: 2015-07-11

## 2015-07-11 NOTE — Telephone Encounter (Signed)
Prescription was escribed to [facility]ologics to process. Pt's last path report shows MSI-stable. 

## 2015-07-11 NOTE — Progress Notes (Signed)
[place] @ [facility] [phone]  [phone]     Barnie Sopko OB: 1940/12/14  MR#: [id number]  NGE#:[id number]  Patient Care Team: No Pcp Per Patient as PCP - General (General Practice) [name], MD (Oncology) [name], MD (General Surgery)  CHIEF COMPLAINT:  Chief Complaint  Patient presents with  . 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 MRI scan of brain shows the right parietal lobe lesion (T4, N2, M1 disease) September, 2014 stage IV 3. Stereotactic radiation therapy to the brain been arranged.  Patient was started on carboplatinum and VP-16. 4. Chest radiation started on December, 2014 5. Patient finished radiation to the chest in January of 2015.  Received total 6000 rads 6. Finished consolidation chemotherapy on March 14, 2013 7. PET scan shows progressive disease in the liver.  September, 2015 8. Started on carboplatinum and CPT-11 from November 14, 2013 9.cancer of the mid sigmoid colon.  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 patient is on FOLFOX and vectibix from April of 2016  11. repeat CT scan shows complete response so patient has been switched over to   vectibix as a maintenance therapy from February of 2017    12. progressive disease by rising CEA and PET scan showing multiple liver metastases  Patient will be started on   LONSURF      04/24/2014 Initial Diagnosis Carcinoma of sigmoid colon      INTERVAL HISTORY:  Patient is here for ongoing evaluation and continuation of chemotherapy.  As developed  rash on the face secondary to [place].  No chills.  Ppatient is taking occasional doxycycline so rash is more prominent on the head face and neck Occasional nosebleed.  No nausea no vomiting no diarrhea.  No soreness in the mouth.  No tingling.  No numbness. Patient is here for ongoing evaluation and treatment consideration Patient is continuing maintenance therapy with  Vectibix.  Grade 1 rash.  Patient is taking doxycycline intermittently.  No nausea no vomiting or diarrhea continues to have mild hypomagnesemia Patient is here for ongoing evaluation and continuation of treatment.  Except for rash and occasional shortness of breath patient does not have any significant complaint.  Patient had PET scan which has been reviewed independently CEA is rising. Here for further evaluation and treatment consideration  ROS Gen. status: Alert oriented not any acute distress. HEENT: No headache.  No dizziness.  No soreness in the mouth.  Pain in the right side of the throat.  Has some difficulty swallowing Wisdom tooth from the left side of the mouth was removed.  Swelling and bleeding  No palpable masses in the neck Lungs: No cough.  No hemoptysis.  No chest pain.  No shortness of breath. Cardiac: No chest pain.  No paroxysmal dyspnea.  No orthopnea.  No palpitation Abdomen: No abdominal pain.  No diarrhea.  No nausea.  No vomiting.  Appetite is stable.  No rectal bleeding. Skin: No ecchymosis.   Marland Kitchenrecurrence  Of rash on face and chest wall area. No itching.  Maculopapular rash on the face Neuro: No headache.  No dizziness.  No  weakness in upper or lower extremity.  No evidence of tingling numbness.. Lower extremity no edema As per HPI. Otherwise, a complete review of systems is negatve.  PAST MEDICAL HISTORY: Past Medical History  Diagnosis Date  . Kidney stones 1975  . Lung cancer [facility]) 2014    Radiation therapy  . Liver cancer ([place])   . Colon cancer ([place]) 01/2014  . Prostate cancer [facility]) 2005      treated by Dr Eliberto Ivory  . Brain cancer ([place]) 01/2014    Chemotherapy and radiation treatment    PAST SURGICAL HISTORY: Past Surgical History  Procedure Laterality Date  . Kidney stones  1975  . Prostate surgery  2005    partial removal due to cancer  . Colon surgery  05-03-14    Resection of Sigmoid Colon and liver biopsy  . Portacath placement  2014    FAMILY HISTORY Family History  Problem Relation Age of Onset  . Lung cancer Father   . Alzheimer's disease Mother        ADVANCED DIRECTIVES: Patient does have advanced health care directive   HEALTH MAINTENANCE: Social History  Substance Use Topics  . Smoking status: Former Smoker -- 35 years  . Smokeless tobacco: Never Used  . Alcohol Use: No      No Known Allergies  Current Outpatient Prescriptions  Medication Sig Dispense Refill  . doxycycline (VIBRA-TABS) 100 MG tablet Take 1 tablet (100 mg total) by mouth 2 (two) times daily. 60 tablet 3  . ibuprofen (ADVIL,MOTRIN) 200 MG tablet Take 200 mg by mouth every 6 (six) hours as needed.    Marland Kitchen oxyCODONE (OXY IR/ROXICODONE) 5 MG immediate release tablet Take 5 mg by mouth every 6 (six) hours as needed.     . predniSONE (DELTASONE) 10 MG tablet Take 1 tablet (10 mg total) by mouth daily with breakfast. 30 tablet 0  . PROVENTIL HFA 108 (90 BASE) MCG/ACT inhaler Inhale 90 mcg into the lungs every 6 (six) hours as needed.    . Triamcinolone Acetonide (TRIAMCINOLONE 0.1 % CREAM : EUCERIN) CREA Apply 1 application topically 2 (two) times daily. 1 each 3   No current facility-administered medications for this visit.   Facility-Administered Medications Ordered in Other Visits  Medication Dose Route Frequency Provider Last Rate Last Dose  . sodium chloride 0.9 % injection 10 mL  10 mL Intracatheter PRN [name], MD   10 mL at 07/10/14 1428    OBJECTIVE: Filed Vitals:   07/10/15 1025  BP: 128/82  Pulse: 93  Temp: 94.4 F (34.7 C)  Resp: 18     Body mass index is  20.68 kg/(m^2).    ECOG FS:1 - Symptomatic but completely ambulatory  Physical Exam  GENERAL:  Well developed, well nourished, sitting comfortably in the exam room in no acute distress. MENTAL STATUS:  Alert and oriented to person, place and time. HEAD:Normocephalic, atraumatic, face symmetric, no Cushingoid features. EYES:  .  Pupils equal round and reactive to light and accomodation.  No conjunctivitis or scleral icterus. ENT:  Oropharynx clear without lesion.  Tongue normal. Mucous membranes moist. . Patient has pain intermittently on the right side of the throat has enlarged tonsils right is more enlarged than the left I do not see any ulcer or fungating mass RESPIRATORY:  Clear to auscultation without rales, wheezes or rhonchi. CARDIOVASCULAR:  Regular rate and rhythm without murmur, rub or gallop. BREAST:  Right breast without masses, skin changes or nipple discharge.  Left breast without  masses, skin changes or nipple discharge. ABDOMEN:  Soft, non-tender, with active bowel sounds, and no hepatosplenomegaly.  No masses. BACK:  No CVA tenderness.  No tenderness on percussion of the back or rib cage. SKIN: Dry scaly skin and the rash on the face. Grade 1 EXTREMITIES: No edema, no skin discoloration or tenderness.  No palpable cords. LYMPH NODES: No palpable cervical, supraclavicular, axillary or inguinal adenopathy  NEUROLOGICAL: Unremarkable. PSYCH:  Appropriate.   LAB RESULTS:      Lab Results  Component Value Date   WBC 5.7 07/10/2015   NEUTROABS 3.2 07/10/2015   HGB 13.0 07/10/2015   HCT 38.2* 07/10/2015   MCV 84.3 07/10/2015   PLT 222 07/10/2015      Chemistry             STUDIES  CEA is 42.6,   3 weeks ago 1. Examination is positive for recurrent multifocal hypermetabolic liver metastasis involving both lobes of liver. 2. No evidence for recurrent tumor within the chest.  (Jul 07, 2015)  ASSESSMENT:   stage IV undifferentiated carcinoma (small cell)  of lung There is no evidence of recurrent disease.2.rectal bleeding.  Colonoscopy revealed mid sigmoid colon cancer 2.  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 Situation has been discussed with the patient will proceed with lonSURF       Pt was educated regarding new oral chemo, lonsurf. Neurosurgeon from [email or web address] given to patient regarding how to take med, side effects, and when to call clinic. Informed consent has been obtained for lonsurf. Pt verbalized understanding.     Carcinoma of sigmoid colon   Staging form: Colon and Rectum, AJCC 7th Edition 70 mg twice a day Monday through 5 Friday 2 weeks.  Weeks off cycle.  Radius side effects have been discussed. We will also order an MSI on colon tissue for future need     Clinical: Stage IVB (yT3, N1a, M1b) - Marni Griffon, MD   07/11/2015 7:14 AM

## 2015-07-11 NOTE — Telephone Encounter (Signed)
-----  Message from [name], MD sent at 07/11/2015  7:21 AM EDT ----- Regarding: LONSURF 70 mg twice a day Monday through Friday for 2 weeks and 2 weeks off 3 refills  2.  Ordered an MSI on colon tissue

## 2015-07-16 ENCOUNTER — Telehealth: Payer: Self-pay | Admitting: *Deleted

## 2015-07-16 NOTE — Telephone Encounter (Signed)
Called regarding Longsurf for patient.  Patient has Medicare Part A&B which does not cover [place].  BCBS plan cancelled 02-15-15.  [place] also contacted patient to inquire if he has additional drug coverage.  Called back to say he does not.  She will be faxing a Agricultural consultant for free drug for this patient.  If questions contact her @ [phone]

## 2015-08-07 ENCOUNTER — Telehealth: Payer: Self-pay | Admitting: *Deleted

## 2015-08-07 NOTE — Telephone Encounter (Signed)
Pt has been approved for free drug of Lonsurf through [place]. Called pt to see if has received phone call to set up delivery of Lonsurf. Pt did not answer and voicemail was left for patient to give me a call back in order to set up follow up appt.

## 2015-08-11 ENCOUNTER — Other Ambulatory Visit: Payer: Self-pay | Admitting: *Deleted

## 2015-08-11 DIAGNOSIS — C187 Malignant neoplasm of sigmoid colon: Secondary | ICD-10-CM

## 2015-08-18 ENCOUNTER — Other Ambulatory Visit: Payer: Self-pay | Admitting: Nurse Practitioner

## 2015-08-25 ENCOUNTER — Inpatient Hospital Stay: Payer: Medicare Other | Attending: Oncology

## 2015-08-25 DIAGNOSIS — Z87442 Personal history of urinary calculi: Secondary | ICD-10-CM | POA: Diagnosis not present

## 2015-08-25 DIAGNOSIS — D696 Thrombocytopenia, unspecified: Secondary | ICD-10-CM | POA: Insufficient documentation

## 2015-08-25 DIAGNOSIS — Z85118 Personal history of other malignant neoplasm of bronchus and lung: Secondary | ICD-10-CM | POA: Insufficient documentation

## 2015-08-25 DIAGNOSIS — Z8546 Personal history of malignant neoplasm of prostate: Secondary | ICD-10-CM | POA: Insufficient documentation

## 2015-08-25 DIAGNOSIS — Z9221 Personal history of antineoplastic chemotherapy: Secondary | ICD-10-CM | POA: Diagnosis not present

## 2015-08-25 DIAGNOSIS — Z923 Personal history of irradiation: Secondary | ICD-10-CM | POA: Insufficient documentation

## 2015-08-25 DIAGNOSIS — C187 Malignant neoplasm of sigmoid colon: Secondary | ICD-10-CM | POA: Diagnosis not present

## 2015-08-25 DIAGNOSIS — Z79899 Other long term (current) drug therapy: Secondary | ICD-10-CM | POA: Diagnosis not present

## 2015-08-25 DIAGNOSIS — C787 Secondary malignant neoplasm of liver and intrahepatic bile duct: Secondary | ICD-10-CM | POA: Diagnosis not present

## 2015-08-25 DIAGNOSIS — D701 Agranulocytosis secondary to cancer chemotherapy: Secondary | ICD-10-CM | POA: Diagnosis not present

## 2015-08-25 DIAGNOSIS — Z87891 Personal history of nicotine dependence: Secondary | ICD-10-CM | POA: Diagnosis not present

## 2015-08-25 DIAGNOSIS — D6481 Anemia due to antineoplastic chemotherapy: Secondary | ICD-10-CM | POA: Insufficient documentation

## 2015-08-25 DIAGNOSIS — T451X5S Adverse effect of antineoplastic and immunosuppressive drugs, sequela: Secondary | ICD-10-CM | POA: Insufficient documentation

## 2015-08-25 DIAGNOSIS — Z800 Family history of malignant neoplasm of digestive organs: Secondary | ICD-10-CM | POA: Insufficient documentation

## 2015-08-25 LAB — CBC WITH DIFFERENTIAL/PLATELET
BASOS PCT: 0 %
Basophils Absolute: 0 10*3/uL (ref 0–0.1)
EOS ABS: 0.1 10*3/uL (ref 0–0.7)
EOS PCT: 2 %
HCT: 35.7 % — ABNORMAL LOW (ref 40.0–52.0)
Hemoglobin: 11.9 g/dL — ABNORMAL LOW (ref 13.0–18.0)
LYMPHS ABS: 1.8 10*3/uL (ref 1.0–3.6)
Lymphocytes Relative: 42 %
MCH: 27.9 pg (ref 26.0–34.0)
MCHC: 33.3 g/dL (ref 32.0–36.0)
MCV: 83.6 fL (ref 80.0–100.0)
MONOS PCT: 6 %
Monocytes Absolute: 0.3 10*3/uL (ref 0.2–1.0)
Neutro Abs: 2.1 10*3/uL (ref 1.4–6.5)
Neutrophils Relative %: 50 %
PLATELETS: 149 10*3/uL — AB (ref 150–440)
RBC: 4.27 MIL/uL — AB (ref 4.40–5.90)
RDW: 17.1 % — ABNORMAL HIGH (ref 11.5–14.5)
WBC: 4.3 10*3/uL (ref 3.8–10.6)

## 2015-08-25 LAB — MAGNESIUM: Magnesium: 1.8 mg/dL (ref 1.7–2.4)

## 2015-08-25 LAB — COMPREHENSIVE METABOLIC PANEL
ALBUMIN: 4.3 g/dL (ref 3.5–5.0)
ALT: 17 U/L (ref 17–63)
ANION GAP: 6 (ref 5–15)
AST: 24 U/L (ref 15–41)
Alkaline Phosphatase: 97 U/L (ref 38–126)
BUN: 16 mg/dL (ref 6–20)
CHLORIDE: 101 mmol/L (ref 101–111)
CO2: 26 mmol/L (ref 22–32)
Calcium: 9 mg/dL (ref 8.9–10.3)
Creatinine, Ser: 0.82 mg/dL (ref 0.61–1.24)
GFR calc non Af Amer: >60 mL/min (ref >60)
Glucose, Bld: 94 mg/dL (ref 65–99)
Potassium: 4 mmol/L (ref 3.5–5.1)
SODIUM: 133 mmol/L — AB (ref 135–145)
Total Bilirubin: 0.8 mg/dL (ref 0.3–1.2)
Total Protein: 7.5 g/dL (ref 6.5–8.1)

## 2015-08-25 LAB — PHOSPHORUS: PHOSPHORUS: 4 mg/dL (ref 2.5–4.6)

## 2015-09-08 ENCOUNTER — Inpatient Hospital Stay: Payer: Medicare Other

## 2015-09-08 ENCOUNTER — Inpatient Hospital Stay (HOSPITAL_BASED_OUTPATIENT_CLINIC_OR_DEPARTMENT_OTHER): Payer: Medicare Other | Admitting: Oncology

## 2015-09-08 ENCOUNTER — Ambulatory Visit: Payer: Medicare Other | Admitting: Oncology

## 2015-09-08 ENCOUNTER — Other Ambulatory Visit: Payer: Medicare Other

## 2015-09-08 VITALS — BP 148/91 | HR 73 | Temp 95.0°F | Resp 18 | Wt 159.8 lb

## 2015-09-08 DIAGNOSIS — C787 Secondary malignant neoplasm of liver and intrahepatic bile duct: Secondary | ICD-10-CM

## 2015-09-08 DIAGNOSIS — C187 Malignant neoplasm of sigmoid colon: Secondary | ICD-10-CM

## 2015-09-08 DIAGNOSIS — D6481 Anemia due to antineoplastic chemotherapy: Secondary | ICD-10-CM

## 2015-09-08 DIAGNOSIS — Z87442 Personal history of urinary calculi: Secondary | ICD-10-CM

## 2015-09-08 DIAGNOSIS — D701 Agranulocytosis secondary to cancer chemotherapy: Secondary | ICD-10-CM | POA: Diagnosis not present

## 2015-09-08 DIAGNOSIS — D696 Thrombocytopenia, unspecified: Secondary | ICD-10-CM

## 2015-09-08 DIAGNOSIS — Z85118 Personal history of other malignant neoplasm of bronchus and lung: Secondary | ICD-10-CM

## 2015-09-08 DIAGNOSIS — Z9221 Personal history of antineoplastic chemotherapy: Secondary | ICD-10-CM

## 2015-09-08 DIAGNOSIS — T451X5S Adverse effect of antineoplastic and immunosuppressive drugs, sequela: Secondary | ICD-10-CM | POA: Diagnosis not present

## 2015-09-08 DIAGNOSIS — Z8546 Personal history of malignant neoplasm of prostate: Secondary | ICD-10-CM

## 2015-09-08 DIAGNOSIS — Z800 Family history of malignant neoplasm of digestive organs: Secondary | ICD-10-CM

## 2015-09-08 DIAGNOSIS — Z87891 Personal history of nicotine dependence: Secondary | ICD-10-CM

## 2015-09-08 DIAGNOSIS — Z923 Personal history of irradiation: Secondary | ICD-10-CM

## 2015-09-08 DIAGNOSIS — Z79899 Other long term (current) drug therapy: Secondary | ICD-10-CM

## 2015-09-08 LAB — CBC WITH DIFFERENTIAL/PLATELET
Basophils Absolute: 0 10*3/uL (ref 0–0.1)
Basophils Relative: 0 %
EOS PCT: 2 %
Eosinophils Absolute: 0 10*3/uL (ref 0–0.7)
HEMATOCRIT: 33.2 % — AB (ref 40.0–52.0)
HEMOGLOBIN: 11.3 g/dL — AB (ref 13.0–18.0)
LYMPHS ABS: 1 10*3/uL (ref 1.0–3.6)
LYMPHS PCT: 56 %
MCH: 28.8 pg (ref 26.0–34.0)
MCHC: 34 g/dL (ref 32.0–36.0)
MCV: 84.7 fL (ref 80.0–100.0)
Monocytes Absolute: 0.3 10*3/uL (ref 0.2–1.0)
Monocytes Relative: 14 %
NEUTROS ABS: 0.5 10*3/uL — AB (ref 1.4–6.5)
NEUTROS PCT: 28 %
Platelets: 128 10*3/uL — ABNORMAL LOW (ref 150–440)
RBC: 3.93 MIL/uL — AB (ref 4.40–5.90)
RDW: 16.8 % — ABNORMAL HIGH (ref 11.5–14.5)
WBC: 1.9 10*3/uL — AB (ref 3.8–10.6)

## 2015-09-08 LAB — COMPREHENSIVE METABOLIC PANEL
ALK PHOS: 116 U/L (ref 38–126)
ALT: 21 U/L (ref 17–63)
AST: 27 U/L (ref 15–41)
Albumin: 4 g/dL (ref 3.5–5.0)
Anion gap: 6 (ref 5–15)
BILIRUBIN TOTAL: 0.8 mg/dL (ref 0.3–1.2)
BUN: 10 mg/dL (ref 6–20)
CALCIUM: 9.2 mg/dL (ref 8.9–10.3)
CO2: 25 mmol/L (ref 22–32)
CREATININE: 0.74 mg/dL (ref 0.61–1.24)
Chloride: 104 mmol/L (ref 101–111)
Glucose, Bld: 90 mg/dL (ref 65–99)
Potassium: 4.1 mmol/L (ref 3.5–5.1)
Sodium: 135 mmol/L (ref 135–145)
Total Protein: 7.3 g/dL (ref 6.5–8.1)

## 2015-09-08 LAB — PHOSPHORUS: PHOSPHORUS: 3.6 mg/dL (ref 2.5–4.6)

## 2015-09-08 LAB — MAGNESIUM: MAGNESIUM: 1.9 mg/dL (ref 1.7–2.4)

## 2015-09-08 NOTE — Progress Notes (Signed)
States is feeling well today. Tolerating lonsurf well.

## 2015-09-10 NOTE — Progress Notes (Signed)
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  INTERVAL HISTORY: Patient returns to clinic today for further evaluation, laboratory work, and to assess his toleration of Lonsurf. He is tolerating his treatments well without significant side effects. He has no neurologic complaints. He denies any recent fevers or illnesses. He has a good appetite and denies weight loss. He has no chest pain or shortness of breath. He denies any nausea, vomiting, constipation, or diarrhea. He has no urinary complaints. Patient feels at his baseline and offers no specific complaints today.  REVIEW OF SYSTEMS:   Review of Systems  Constitutional: Negative.  Negative for fever, malaise/fatigue and weight loss.  Respiratory: Negative.  Negative for cough and shortness of breath.   Cardiovascular: Negative.  Negative for chest pain.  Gastrointestinal: Negative.  Negative for abdominal pain.  Musculoskeletal: Negative.   Neurological: Negative.  Negative for weakness.  Psychiatric/Behavioral: Negative.     As per HPI. Otherwise, a complete review of systems is negatve.  PAST MEDICAL HISTORY: Past Medical History:  Diagnosis Date  . Brain cancer ([place]) 01/2014   Chemotherapy and radiation treatment  . Colon cancer ([place]) 01/2014  . Kidney stones 1975  . Liver cancer ([place])   . Lung cancer [facility]) 2014   Radiation therapy  . Prostate cancer [facility]) 2005   treated by Dr Eliberto Ivory    PAST SURGICAL HISTORY: Past Surgical History:  Procedure Laterality Date  . COLON SURGERY  05-03-14   Resection of Sigmoid Colon and liver biopsy  . kidney stones  1975  . PORTACATH PLACEMENT  2014  . PROSTATE SURGERY  2005   partial removal due to cancer    FAMILY HISTORY: Family History  Problem Relation Age of Onset  . Lung cancer Father    . Alzheimer's disease Mother        ADVANCED DIRECTIVES:    HEALTH MAINTENANCE: Social History  Substance Use Topics  . Smoking status: Former Smoker    Years: 35.00  . Smokeless tobacco: Never Used  . Alcohol use No     Colonoscopy:  PAP:  Bone density:  Lipid panel:  No Known Allergies  Current Outpatient Prescriptions  Medication Sig Dispense Refill  . ibuprofen (ADVIL,MOTRIN) 200 MG tablet Take 200 mg by mouth every 6 (six) hours as needed.    Marland Kitchen oxyCODONE (OXY IR/ROXICODONE) 5 MG immediate release tablet Take 5 mg by mouth every 6 (six) hours as needed.     Marland Kitchen PROVENTIL HFA 108 (90 BASE) MCG/ACT inhaler Inhale 90 mcg into the lungs every 6 (six) hours as needed.    Marland Kitchen trifluridine-tipiracil (LONSURF) 15-6.14 MG tablet Take 2 tabs by mouth twice a day Monday-Friday for 2 weeks, then 2 weeks off. Total dose 44m. 40 tablet 3  . trifluridine-tipiracil (LONSURF) 20-8.19 MG tablet Take 2 tabs by mouth twice a day Monday-Friday for 2 weeks, then 2 weeks off. Total dose 762m 40 tablet 3   No current facility-administered medications for this visit.    Facility-Administered Medications Ordered in Other Visits  Medication Dose Route Frequency Provider Last Rate Last Dose  . sodium chloride 0.9 % injection 10 mL  10 mL Intracatheter PRN JaForest GleasonMD   10 mL at 07/10/14 1428    OBJECTIVE: Vitals:   09/08/15 0940  BP: (!) 148/91  Pulse: 73  Resp: 18  Temp: (!) 95 F (35 C)     Body mass index is 20.52 kg/m.    ECOG FS:0 - Asymptomatic  General: Well-developed, well-nourished, no acute distress. Eyes: Pink conjunctiva, anicteric sclera. HEENT: Normocephalic, moist mucous membranes, clear oropharnyx. Lungs: Clear to auscultation bilaterally. Heart: Regular rate and rhythm. No rubs, murmurs, or gallops. Abdomen: Soft, nontender, nondistended. No organomegaly noted, normoactive bowel sounds. Musculoskeletal: No edema, cyanosis, or clubbing. Neuro: Alert, answering  all questions appropriately. Cranial nerves grossly intact. Skin: No rashes or petechiae noted. Psych: Normal affect. Lymphatics: No cervical, calvicular, axillary or inguinal LAD.   LAB RESULTS:  Lab Results  Component Value Date   NA 135 09/08/2015   K 4.1 09/08/2015   CL 104 09/08/2015   CO2 25 09/08/2015   GLUCOSE 90 09/08/2015   BUN 10 09/08/2015   CREATININE 0.74 09/08/2015   CALCIUM 9.2 09/08/2015   PROT 7.3 09/08/2015   ALBUMIN 4.0 09/08/2015   AST 27 09/08/2015   ALT 21 09/08/2015   ALKPHOS 116 09/08/2015   BILITOT 0.8 09/08/2015   GFRNONAA >60 09/08/2015   GFRAA >60 09/08/2015    Lab Results  Component Value Date   WBC 1.9 (L) 09/08/2015   NEUTROABS 0.5 (L) 09/08/2015   HGB 11.3 (L) 09/08/2015   HCT 33.2 (L) 09/08/2015   MCV 84.7 09/08/2015   PLT 128 (L) 09/08/2015   Lab Results  Component Value Date   CEA 42.6 (H) 06/18/2015     STUDIES: No results found.  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 He received carboplatinum and etoposide along with XRT completing in approximately January 2015. Patient had a recurrence in his liver in September 2015 and was reinitiated on treatment with carboplatinum and etoposide again. He was then diagnosed with adenocarcinoma of the sigmoid colon with biopsy-proven metastasis to the liver in March 2016. K-ras wild-type. Patient initiated FOLFOX and Vectibix in April 2016. Patient was found to have a complete response, therefore was switched to Vectibix maintenance therapy in February 2017. Patient was then found to have progressive disease on a PET scan in May 2017 and was subsequently switched to [facility].   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  PLAN:    1.  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 Patient is  tolerating Lonsurf well without significant side effects. He will finish this round of chemotherapy on September 10, 2015. Patient is taking 35 mg/m of Lonsurf on days 1 through 5 and days 8 through 12 with 2 weeks off.  Return to clinic on September 26, 2015 for laboratory work only which will be one day prior to initiation of his next cycle. Patient will then return to clinic on October 08, 2015 for repeat laboratory work and further evaluation. 2. Neutropenia: Secondary to treatment. Patient has been instructed to complete his therapy as prescribed and will check laboratory work as above. 3. Anemia: Mild, secondary treatment. Monitor. 4. Thrombocytopenia: Mild, secondary treatment. Monitor. 5. 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 We will continue to monitor with routine imaging.   Patient expressed understanding and was in agreement with this plan. He also understands that He can call clinic at any time with any questions, concerns, or complaints.   Carcinoma of sigmoid colon [facility])   Staging form: Colon and Rectum,  AJCC 7th Edition   - Clinical: Stage IVB (yT3, N1a, M1b) - Unsigned  Lloyd Huger, MD   09/10/2015 8:17 AM

## 2015-09-26 ENCOUNTER — Telehealth: Payer: Self-pay | Admitting: *Deleted

## 2015-09-26 ENCOUNTER — Inpatient Hospital Stay: Payer: Medicare Other | Attending: Oncology

## 2015-09-26 DIAGNOSIS — Z85038 Personal history of other malignant neoplasm of large intestine: Secondary | ICD-10-CM | POA: Insufficient documentation

## 2015-09-26 DIAGNOSIS — Z923 Personal history of irradiation: Secondary | ICD-10-CM | POA: Diagnosis not present

## 2015-09-26 DIAGNOSIS — C187 Malignant neoplasm of sigmoid colon: Secondary | ICD-10-CM | POA: Diagnosis not present

## 2015-09-26 DIAGNOSIS — Z9221 Personal history of antineoplastic chemotherapy: Secondary | ICD-10-CM | POA: Insufficient documentation

## 2015-09-26 DIAGNOSIS — Z87891 Personal history of nicotine dependence: Secondary | ICD-10-CM | POA: Insufficient documentation

## 2015-09-26 DIAGNOSIS — C787 Secondary malignant neoplasm of liver and intrahepatic bile duct: Secondary | ICD-10-CM | POA: Diagnosis not present

## 2015-09-26 DIAGNOSIS — Z85841 Personal history of malignant neoplasm of brain: Secondary | ICD-10-CM | POA: Diagnosis not present

## 2015-09-26 DIAGNOSIS — Z79899 Other long term (current) drug therapy: Secondary | ICD-10-CM | POA: Insufficient documentation

## 2015-09-26 DIAGNOSIS — Z85118 Personal history of other malignant neoplasm of bronchus and lung: Secondary | ICD-10-CM | POA: Insufficient documentation

## 2015-09-26 DIAGNOSIS — D649 Anemia, unspecified: Secondary | ICD-10-CM | POA: Insufficient documentation

## 2015-09-26 DIAGNOSIS — Z8546 Personal history of malignant neoplasm of prostate: Secondary | ICD-10-CM | POA: Insufficient documentation

## 2015-09-26 DIAGNOSIS — Z800 Family history of malignant neoplasm of digestive organs: Secondary | ICD-10-CM | POA: Insufficient documentation

## 2015-09-26 DIAGNOSIS — Z87442 Personal history of urinary calculi: Secondary | ICD-10-CM | POA: Insufficient documentation

## 2015-09-26 LAB — CBC WITH DIFFERENTIAL/PLATELET
BASOS ABS: 0 10*3/uL (ref 0–0.1)
Eosinophils Absolute: 0 10*3/uL (ref 0–0.7)
HEMATOCRIT: 27 % — AB (ref 40.0–52.0)
Hemoglobin: 9.5 g/dL — ABNORMAL LOW (ref 13.0–18.0)
Lymphs Abs: 0.7 10*3/uL — ABNORMAL LOW (ref 1.0–3.6)
MCH: 29.5 pg (ref 26.0–34.0)
MCHC: 35.3 g/dL (ref 32.0–36.0)
MCV: 83.5 fL (ref 80.0–100.0)
Monocytes Absolute: 0.1 10*3/uL — ABNORMAL LOW (ref 0.2–1.0)
Monocytes Relative: 12 %
Neutro Abs: 0.2 10*3/uL — ABNORMAL LOW (ref 1.7–7.7)
PLATELETS: 67 10*3/uL — AB (ref 150–440)
RBC: 3.23 MIL/uL — AB (ref 4.40–5.90)
RDW: 16.8 % — ABNORMAL HIGH (ref 11.5–14.5)
WBC: 1 10*3/uL — AB (ref 3.8–10.6)

## 2015-09-26 LAB — COMPREHENSIVE METABOLIC PANEL
ALBUMIN: 3.8 g/dL (ref 3.5–5.0)
ALT: 16 U/L — AB (ref 17–63)
AST: 24 U/L (ref 15–41)
Alkaline Phosphatase: 126 U/L (ref 38–126)
Anion gap: 6 (ref 5–15)
BUN: 10 mg/dL (ref 6–20)
CHLORIDE: 102 mmol/L (ref 101–111)
CO2: 27 mmol/L (ref 22–32)
CREATININE: 0.89 mg/dL (ref 0.61–1.24)
Calcium: 8.9 mg/dL (ref 8.9–10.3)
GFR calc Af Amer: >60 mL/min (ref >60)
GLUCOSE: 97 mg/dL (ref 65–99)
Potassium: 4 mmol/L (ref 3.5–5.1)
Sodium: 135 mmol/L (ref 135–145)
Total Bilirubin: 0.6 mg/dL (ref 0.3–1.2)
Total Protein: 7.5 g/dL (ref 6.5–8.1)

## 2015-09-26 LAB — MAGNESIUM: MAGNESIUM: 1.9 mg/dL (ref 1.7–2.4)

## 2015-09-26 LAB — PHOSPHORUS: Phosphorus: 2.6 mg/dL (ref 2.5–4.6)

## 2015-09-26 NOTE — Telephone Encounter (Signed)
Dr. Mike Gip notified of critical [place] and WBC. MD reviewed CBC and recommended that patient return on Monday 8.14 to recheck labs. Pt stated is to start on Lonsurf on 8/16. Pt instructed of neutropenic precautions and to call back if develops fever, bleeding. Pt verbalized understanding.

## 2015-09-27 LAB — CEA: CEA: 319 ng/mL — ABNORMAL HIGH (ref 0.0–4.7)

## 2015-09-29 ENCOUNTER — Telehealth: Payer: Self-pay | Admitting: *Deleted

## 2015-09-29 ENCOUNTER — Inpatient Hospital Stay: Payer: Medicare Other | Admitting: *Deleted

## 2015-09-29 DIAGNOSIS — C187 Malignant neoplasm of sigmoid colon: Secondary | ICD-10-CM

## 2015-09-29 DIAGNOSIS — Z9221 Personal history of antineoplastic chemotherapy: Secondary | ICD-10-CM | POA: Diagnosis not present

## 2015-09-29 DIAGNOSIS — D649 Anemia, unspecified: Secondary | ICD-10-CM | POA: Diagnosis not present

## 2015-09-29 DIAGNOSIS — Z85841 Personal history of malignant neoplasm of brain: Secondary | ICD-10-CM | POA: Diagnosis not present

## 2015-09-29 DIAGNOSIS — Z923 Personal history of irradiation: Secondary | ICD-10-CM | POA: Diagnosis not present

## 2015-09-29 DIAGNOSIS — C787 Secondary malignant neoplasm of liver and intrahepatic bile duct: Secondary | ICD-10-CM | POA: Diagnosis not present

## 2015-09-29 LAB — CBC WITH DIFFERENTIAL/PLATELET
Basophils Absolute: 0 10*3/uL (ref 0–0.1)
Eosinophils Absolute: 0 10*3/uL (ref 0–0.7)
Eosinophils Relative: 1 %
HCT: 28.3 % — ABNORMAL LOW (ref 40.0–52.0)
Hemoglobin: 10 g/dL — ABNORMAL LOW (ref 13.0–18.0)
Lymphocytes Relative: 54 %
Lymphs Abs: 1 10*3/uL (ref 1.0–3.6)
MCH: 29.8 pg (ref 26.0–34.0)
MCHC: 35.2 g/dL (ref 32.0–36.0)
MCV: 84.7 fL (ref 80.0–100.0)
Monocytes Absolute: 0.3 10*3/uL (ref 0.2–1.0)
Monocytes Relative: 16 %
NEUTROS ABS: 0.5 10*3/uL — AB (ref 1.4–6.5)
Platelets: 91 10*3/uL — ABNORMAL LOW (ref 150–440)
RBC: 3.35 MIL/uL — ABNORMAL LOW (ref 4.40–5.90)
RDW: 16.7 % — AB (ref 11.5–14.5)
WBC: 1.8 10*3/uL — ABNORMAL LOW (ref 3.8–10.6)

## 2015-09-29 NOTE — Telephone Encounter (Signed)
Notified pt of lab results from this morning and informed pt that blood counts are still too low to start taking oral chemo, Lonsurf at this time. Informed pt that our office will move appt on 8/23 to Monday 8/21 to recheck labs and meet with Dr. Grayland Ormond to discuss when he can start taking lonsurf again. Pt verbalized understanding. Message sent to scheduling to move appt.

## 2015-10-03 ENCOUNTER — Other Ambulatory Visit: Payer: Self-pay | Admitting: *Deleted

## 2015-10-03 DIAGNOSIS — C187 Malignant neoplasm of sigmoid colon: Secondary | ICD-10-CM

## 2015-10-05 DIAGNOSIS — C787 Secondary malignant neoplasm of liver and intrahepatic bile duct: Secondary | ICD-10-CM | POA: Insufficient documentation

## 2015-10-05 NOTE — Progress Notes (Signed)
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  INTERVAL HISTORY: Patient returns to clinic today for further evaluation, laboratory work, and consideration of initiating cycle 3 of Lonsurf. He is tolerating his treatments well without significant side effects. He has no neurologic complaints. He denies any recent fevers or illnesses. He has a good appetite and denies weight loss. He has no chest pain or shortness of breath. He denies any nausea, vomiting, constipation, or diarrhea. He has no urinary complaints. Patient feels at his baseline and offers no specific complaints today.  REVIEW OF SYSTEMS:   Review of Systems  Constitutional: Negative.  Negative for fever, malaise/fatigue and weight loss.  Respiratory: Negative.  Negative for cough and shortness of breath.   Cardiovascular: Negative.  Negative for chest pain.  Gastrointestinal: Negative.  Negative for abdominal pain.  Musculoskeletal: Negative.   Neurological: Negative.  Negative for weakness.  Psychiatric/Behavioral: Negative.     As per HPI. Otherwise, a complete review of systems is negatve.  PAST MEDICAL HISTORY: Past Medical History:  Diagnosis Date  . Brain cancer ([place]) 01/2014   Chemotherapy and radiation treatment  . Colon cancer ([place]) 01/2014  . Kidney stones 1975  . Liver cancer ([place])   . Lung cancer [facility]) 2014   Radiation therapy  . Prostate cancer [facility]) 2005   treated by Dr Eliberto Ivory    PAST SURGICAL HISTORY: Past Surgical History:  Procedure Laterality Date  . COLON SURGERY  05-03-14   Resection of Sigmoid Colon and liver biopsy  . kidney stones  1975  . PORTACATH PLACEMENT  2014  . PROSTATE SURGERY  2005   partial removal due to cancer    FAMILY HISTORY: Family History  Problem Relation Age of Onset  . Lung  cancer Father   . Alzheimer's disease Mother        ADVANCED DIRECTIVES:    HEALTH MAINTENANCE: Social History  Substance Use Topics  . Smoking status: Former Smoker    Years: 35.00  . Smokeless tobacco: Never Used  . Alcohol use No     Colonoscopy:  PAP:  Bone density:  Lipid panel:  No Known Allergies  Current Outpatient Prescriptions  Medication Sig Dispense Refill  . ibuprofen (ADVIL,MOTRIN) 200 MG tablet Take 200 mg by mouth every 6 (six) hours as needed.    Marland Kitchen oxyCODONE (OXY IR/ROXICODONE) 5 MG immediate release tablet Take 5 mg by mouth every 6 (six) hours as needed.     Marland Kitchen PROVENTIL HFA 108 (90 BASE) MCG/ACT inhaler Inhale 90 mcg into the lungs every 6 (six) hours as needed.    Marland Kitchen trifluridine-tipiracil (LONSURF) 15-6.14 MG tablet Take 2 tabs by mouth twice a day Monday-Friday for 2 weeks, then 2 weeks off. Total dose 10m. 40 tablet 3  . trifluridine-tipiracil (LONSURF) 20-8.19 MG tablet Take 2 tabs by mouth twice a day Monday-Friday for 2 weeks, then 2 weeks off. Total dose 771m 40 tablet 3   No current facility-administered medications for this visit.    Facility-Administered Medications Ordered in Other Visits  Medication Dose Route Frequency Provider Last Rate Last Dose  . sodium chloride 0.9 % injection 10 mL  10 mL Intracatheter PRN JaForest GleasonMD   10 mL at 07/10/14 1428    OBJECTIVE: Vitals:   10/06/15 0844  BP: 126/85  Pulse: 94  Resp: 18  Temp: (!) 95.4 F (35.2 C)     Body mass index is 19.93 kg/m.    ECOG FS:0 - Asymptomatic  General: Well-developed, well-nourished, no acute distress. Eyes: Pink conjunctiva, anicteric sclera. Lungs: Clear to auscultation bilaterally. Heart: Regular rate and rhythm. No rubs, murmurs, or gallops. Abdomen: Soft, nontender, nondistended. No organomegaly noted, normoactive bowel sounds. Musculoskeletal: No edema, cyanosis, or clubbing. Neuro: Alert, answering all questions appropriately. Cranial nerves grossly  intact. Skin: No rashes or petechiae noted. Psych: Normal affect.    LAB RESULTS:  Lab Results  Component Value Date   NA 135 10/06/2015   K 3.9 10/06/2015   CL 103 10/06/2015   CO2 24 10/06/2015   GLUCOSE 93 10/06/2015   BUN 11 10/06/2015   CREATININE 0.81 10/06/2015   CALCIUM 9.0 10/06/2015   PROT 7.8 10/06/2015   ALBUMIN 3.8 10/06/2015   AST 31 10/06/2015   ALT 18 10/06/2015   ALKPHOS 150 (H) 10/06/2015   BILITOT 0.4 10/06/2015   GFRNONAA >60 10/06/2015   GFRAA >60 10/06/2015    Lab Results  Component Value Date   WBC 3.0 (L) 10/06/2015   NEUTROABS 1.2 (L) 10/06/2015   HGB 9.7 (L) 10/06/2015   HCT 27.7 (L) 10/06/2015   MCV 85.4 10/06/2015   PLT 169 10/06/2015   Lab Results  Component Value Date   CEA 319.0 (H) 09/26/2015     STUDIES: No results found.  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 He received carboplatinum and etoposide along with XRT completing in approximately January 2015. Patient had a recurrence in his liver in September 2015 and was reinitiated on treatment with carboplatinum and etoposide again. He was then diagnosed with adenocarcinoma of the sigmoid colon with biopsy-proven metastasis to the liver in March 2016. K-ras wild-type. Patient initiated FOLFOX and Vectibix in April 2016. Patient was found to have a complete response, therefore was switched to Vectibix maintenance therapy in February 2017. Patient was then found to have progressive disease on a PET scan in May 2017 and was subsequently switched to [facility].   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  PLAN:    1.  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 Patient is tolerating Lonsurf well without significant side effects. Patient states she fell count has now recovered therefore  he will proceed with cycle 3 of Lonsurf today. He is taking 35 mg/m of Lonsurf on days 1 through 5 and days 8 through 12 with 2 weeks off.  Return to clinic in 2 weeks for laboratory work and then in 4 weeks for consideration of initiation of cycle 4. Will reimage at the conclusion of cycle 4.  2. Neutropenia: Resolved. Secondary to treatment.  3. Anemia: Mild, secondary treatment. Monitor. 4. Thrombocytopenia: Resolved.. Monitor. 5. 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 We will continue to monitor with routine imaging.   Patient expressed understanding and was in agreement with this plan. He also understands that He can call clinic at any time with any questions, concerns, or complaints.   Carcinoma of sigmoid colon [facility])   Staging form: Colon and Rectum, AJCC 7th Edition   - Clinical: Stage IVB (yT3, N1a, M1b) - Unsigned  Lloyd Huger, MD   10/06/2015 9:22 AM

## 2015-10-06 ENCOUNTER — Inpatient Hospital Stay (HOSPITAL_BASED_OUTPATIENT_CLINIC_OR_DEPARTMENT_OTHER): Payer: Medicare Other | Admitting: Oncology

## 2015-10-06 ENCOUNTER — Inpatient Hospital Stay: Payer: Medicare Other

## 2015-10-06 VITALS — BP 126/85 | HR 94 | Temp 95.4°F | Resp 18 | Wt 155.2 lb

## 2015-10-06 DIAGNOSIS — D649 Anemia, unspecified: Secondary | ICD-10-CM

## 2015-10-06 DIAGNOSIS — C187 Malignant neoplasm of sigmoid colon: Secondary | ICD-10-CM

## 2015-10-06 DIAGNOSIS — Z8546 Personal history of malignant neoplasm of prostate: Secondary | ICD-10-CM

## 2015-10-06 DIAGNOSIS — Z85038 Personal history of other malignant neoplasm of large intestine: Secondary | ICD-10-CM

## 2015-10-06 DIAGNOSIS — Z85841 Personal history of malignant neoplasm of brain: Secondary | ICD-10-CM

## 2015-10-06 DIAGNOSIS — Z85118 Personal history of other malignant neoplasm of bronchus and lung: Secondary | ICD-10-CM

## 2015-10-06 DIAGNOSIS — Z800 Family history of malignant neoplasm of digestive organs: Secondary | ICD-10-CM

## 2015-10-06 DIAGNOSIS — C787 Secondary malignant neoplasm of liver and intrahepatic bile duct: Secondary | ICD-10-CM

## 2015-10-06 DIAGNOSIS — Z87891 Personal history of nicotine dependence: Secondary | ICD-10-CM

## 2015-10-06 DIAGNOSIS — Z87442 Personal history of urinary calculi: Secondary | ICD-10-CM

## 2015-10-06 DIAGNOSIS — Z9221 Personal history of antineoplastic chemotherapy: Secondary | ICD-10-CM | POA: Diagnosis not present

## 2015-10-06 DIAGNOSIS — Z923 Personal history of irradiation: Secondary | ICD-10-CM | POA: Diagnosis not present

## 2015-10-06 DIAGNOSIS — Z79899 Other long term (current) drug therapy: Secondary | ICD-10-CM

## 2015-10-06 LAB — CBC WITH DIFFERENTIAL/PLATELET
Basophils Absolute: 0 10*3/uL (ref 0–0.1)
Basophils Relative: 0 %
Eosinophils Absolute: 0 10*3/uL (ref 0–0.7)
Eosinophils Relative: 0 %
HEMATOCRIT: 27.7 % — AB (ref 40.0–52.0)
Hemoglobin: 9.7 g/dL — ABNORMAL LOW (ref 13.0–18.0)
LYMPHS ABS: 1.1 10*3/uL (ref 1.0–3.6)
LYMPHS PCT: 37 %
MCH: 29.8 pg (ref 26.0–34.0)
MCHC: 34.9 g/dL (ref 32.0–36.0)
MCV: 85.4 fL (ref 80.0–100.0)
MONO ABS: 0.7 10*3/uL (ref 0.2–1.0)
MONOS PCT: 24 %
NEUTROS ABS: 1.2 10*3/uL — AB (ref 1.4–6.5)
Neutrophils Relative %: 39 %
Platelets: 169 10*3/uL (ref 150–440)
RBC: 3.24 MIL/uL — ABNORMAL LOW (ref 4.40–5.90)
RDW: 19.1 % — AB (ref 11.5–14.5)
WBC: 3 10*3/uL — ABNORMAL LOW (ref 3.8–10.6)

## 2015-10-06 LAB — COMPREHENSIVE METABOLIC PANEL
ALT: 18 U/L (ref 17–63)
ANION GAP: 8 (ref 5–15)
AST: 31 U/L (ref 15–41)
Albumin: 3.8 g/dL (ref 3.5–5.0)
Alkaline Phosphatase: 150 U/L — ABNORMAL HIGH (ref 38–126)
BILIRUBIN TOTAL: 0.4 mg/dL (ref 0.3–1.2)
BUN: 11 mg/dL (ref 6–20)
CO2: 24 mmol/L (ref 22–32)
Calcium: 9 mg/dL (ref 8.9–10.3)
Chloride: 103 mmol/L (ref 101–111)
Creatinine, Ser: 0.81 mg/dL (ref 0.61–1.24)
Glucose, Bld: 93 mg/dL (ref 65–99)
POTASSIUM: 3.9 mmol/L (ref 3.5–5.1)
Sodium: 135 mmol/L (ref 135–145)
TOTAL PROTEIN: 7.8 g/dL (ref 6.5–8.1)

## 2015-10-06 NOTE — Progress Notes (Signed)
States is feeling well. Offers no complaints. Here to check labs to see if can restart Lonsurf.

## 2015-10-08 ENCOUNTER — Other Ambulatory Visit: Payer: Medicare Other

## 2015-10-08 ENCOUNTER — Ambulatory Visit: Payer: Medicare Other | Admitting: Oncology

## 2015-10-21 ENCOUNTER — Inpatient Hospital Stay: Payer: Medicare Other | Attending: Oncology

## 2015-10-21 DIAGNOSIS — Z85841 Personal history of malignant neoplasm of brain: Secondary | ICD-10-CM | POA: Diagnosis not present

## 2015-10-21 DIAGNOSIS — Z923 Personal history of irradiation: Secondary | ICD-10-CM | POA: Diagnosis not present

## 2015-10-21 DIAGNOSIS — Z801 Family history of malignant neoplasm of trachea, bronchus and lung: Secondary | ICD-10-CM | POA: Diagnosis not present

## 2015-10-21 DIAGNOSIS — T451X5S Adverse effect of antineoplastic and immunosuppressive drugs, sequela: Secondary | ICD-10-CM | POA: Diagnosis not present

## 2015-10-21 DIAGNOSIS — Z9221 Personal history of antineoplastic chemotherapy: Secondary | ICD-10-CM | POA: Diagnosis not present

## 2015-10-21 DIAGNOSIS — Z87891 Personal history of nicotine dependence: Secondary | ICD-10-CM | POA: Diagnosis not present

## 2015-10-21 DIAGNOSIS — Z8546 Personal history of malignant neoplasm of prostate: Secondary | ICD-10-CM | POA: Insufficient documentation

## 2015-10-21 DIAGNOSIS — D6481 Anemia due to antineoplastic chemotherapy: Secondary | ICD-10-CM | POA: Insufficient documentation

## 2015-10-21 DIAGNOSIS — Z79899 Other long term (current) drug therapy: Secondary | ICD-10-CM | POA: Insufficient documentation

## 2015-10-21 DIAGNOSIS — C787 Secondary malignant neoplasm of liver and intrahepatic bile duct: Secondary | ICD-10-CM | POA: Diagnosis not present

## 2015-10-21 DIAGNOSIS — Z87442 Personal history of urinary calculi: Secondary | ICD-10-CM | POA: Diagnosis not present

## 2015-10-21 DIAGNOSIS — C187 Malignant neoplasm of sigmoid colon: Secondary | ICD-10-CM | POA: Diagnosis not present

## 2015-10-21 DIAGNOSIS — D701 Agranulocytosis secondary to cancer chemotherapy: Secondary | ICD-10-CM | POA: Diagnosis not present

## 2015-10-21 LAB — CBC WITH DIFFERENTIAL/PLATELET
BASOS ABS: 0 10*3/uL (ref 0–0.1)
Basophils Relative: 1 %
Eosinophils Absolute: 0 10*3/uL (ref 0–0.7)
Eosinophils Relative: 1 %
HEMATOCRIT: 25.5 % — AB (ref 40.0–52.0)
HEMOGLOBIN: 8.9 g/dL — AB (ref 13.0–18.0)
Lymphs Abs: 0.7 10*3/uL — ABNORMAL LOW (ref 1.0–3.6)
MCH: 29.7 pg (ref 26.0–34.0)
MCHC: 34.8 g/dL (ref 32.0–36.0)
MCV: 85.4 fL (ref 80.0–100.0)
MONO ABS: 0.1 10*3/uL — AB (ref 0.2–1.0)
Monocytes Relative: 5 %
NEUTROS ABS: 0.8 10*3/uL — AB (ref 1.4–6.5)
Neutrophils Relative %: 50 %
Platelets: 66 10*3/uL — ABNORMAL LOW (ref 150–440)
RBC: 2.99 MIL/uL — AB (ref 4.40–5.90)
RDW: 20.8 % — ABNORMAL HIGH (ref 11.5–14.5)
WBC: 1.5 10*3/uL — AB (ref 3.8–10.6)

## 2015-10-27 ENCOUNTER — Other Ambulatory Visit: Payer: Self-pay | Admitting: *Deleted

## 2015-11-02 NOTE — Progress Notes (Deleted)
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  INTERVAL HISTORY: Patient returns to clinic today for further evaluation, laboratory work, and consideration of initiating cycle 3 of Lonsurf. He is tolerating his treatments well without significant side effects. He has no neurologic complaints. He denies any recent fevers or illnesses. He has a good appetite and denies weight loss. He has no chest pain or shortness of breath. He denies any nausea, vomiting, constipation, or diarrhea. He has no urinary complaints. Patient feels at his baseline and offers no specific complaints today.  REVIEW OF SYSTEMS:   Review of Systems  Constitutional: Negative.  Negative for fever, malaise/fatigue and weight loss.  Respiratory: Negative.  Negative for cough and shortness of breath.   Cardiovascular: Negative.  Negative for chest pain.  Gastrointestinal: Negative.  Negative for abdominal pain.  Musculoskeletal: Negative.   Neurological: Negative.  Negative for weakness.  Psychiatric/Behavioral: Negative.     As per HPI. Otherwise, a complete review of systems is negatve.  PAST MEDICAL HISTORY: Past Medical History:  Diagnosis Date  . Brain cancer ([place]) 01/2014   Chemotherapy and radiation treatment  . Colon cancer ([place]) 01/2014  . Kidney stones 1975  . Liver cancer ([place])   . Lung cancer [facility]) 2014   Radiation therapy  . Prostate cancer [facility]) 2005   treated by Dr Eliberto Ivory    PAST SURGICAL HISTORY: Past Surgical History:  Procedure Laterality Date  . COLON SURGERY  05-03-14   Resection of Sigmoid Colon and liver biopsy  . kidney stones  1975  . PORTACATH PLACEMENT  2014  . PROSTATE SURGERY  2005   partial removal due to cancer    FAMILY HISTORY: Family History  Problem Relation Age of Onset  . Lung  cancer Father   . Alzheimer's disease Mother        ADVANCED DIRECTIVES:    HEALTH MAINTENANCE: Social History  Substance Use Topics  . Smoking status: Former Smoker    Years: 35.00  . Smokeless tobacco: Never Used  . Alcohol use No     Colonoscopy:  PAP:  Bone density:  Lipid panel:  No Known Allergies  Current Outpatient Prescriptions  Medication Sig Dispense Refill  . ibuprofen (ADVIL,MOTRIN) 200 MG tablet Take 200 mg by mouth every 6 (six) hours as needed.    Marland Kitchen oxyCODONE (OXY IR/ROXICODONE) 5 MG immediate release tablet Take 5 mg by mouth every 6 (six) hours as needed.     Marland Kitchen PROVENTIL HFA 108 (90 BASE) MCG/ACT inhaler Inhale 90 mcg into the lungs every 6 (six) hours as needed.    Marland Kitchen trifluridine-tipiracil (LONSURF) 15-6.14 MG tablet Take 2 tabs by mouth twice a day Monday-Friday for 2 weeks, then 2 weeks off. Total dose 70m. 40 tablet 3  . trifluridine-tipiracil (LONSURF) 20-8.19 MG tablet Take 2 tabs by mouth twice a day Monday-Friday for 2 weeks, then 2 weeks off. Total dose 726m 40 tablet 3   No current facility-administered medications for this visit.    Facility-Administered Medications Ordered in Other Visits  Medication Dose Route Frequency Provider Last Rate Last Dose  . sodium chloride 0.9 % injection 10 mL  10 mL Intracatheter PRN JaForest GleasonMD   10 mL at 07/10/14 1428    OBJECTIVE: There were no vitals filed for this visit.   There is no height or  weight on file to calculate BMI.    ECOG FS:0 - Asymptomatic  General: Well-developed, well-nourished, no acute distress. Eyes: Pink conjunctiva, anicteric sclera. Lungs: Clear to auscultation bilaterally. Heart: Regular rate and rhythm. No rubs, murmurs, or gallops. Abdomen: Soft, nontender, nondistended. No organomegaly noted, normoactive bowel sounds. Musculoskeletal: No edema, cyanosis, or clubbing. Neuro: Alert, answering all questions appropriately. Cranial nerves grossly intact. Skin: No rashes or  petechiae noted. Psych: Normal affect.    LAB RESULTS:  Lab Results  Component Value Date   NA 135 10/06/2015   K 3.9 10/06/2015   CL 103 10/06/2015   CO2 24 10/06/2015   GLUCOSE 93 10/06/2015   BUN 11 10/06/2015   CREATININE 0.81 10/06/2015   CALCIUM 9.0 10/06/2015   PROT 7.8 10/06/2015   ALBUMIN 3.8 10/06/2015   AST 31 10/06/2015   ALT 18 10/06/2015   ALKPHOS 150 (H) 10/06/2015   BILITOT 0.4 10/06/2015   GFRNONAA >60 10/06/2015   GFRAA >60 10/06/2015    Lab Results  Component Value Date   WBC 1.5 (L) 10/21/2015   NEUTROABS 0.8 (L) 10/21/2015   HGB 8.9 (L) 10/21/2015   HCT 25.5 (L) 10/21/2015   MCV 85.4 10/21/2015   PLT 66 (L) 10/21/2015   Lab Results  Component Value Date   CEA 319.0 (H) 09/26/2015     STUDIES: No results found.  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 He received carboplatinum and etoposide along with XRT completing in approximately January 2015. Patient had a recurrence in his liver in September 2015 and was reinitiated on treatment with carboplatinum and etoposide again. He was then diagnosed with adenocarcinoma of the sigmoid colon with biopsy-proven metastasis to the liver in March 2016. K-ras wild-type. Patient initiated FOLFOX and Vectibix in April 2016. Patient was found to have a complete response, therefore was switched to Vectibix maintenance therapy in February 2017. Patient was then found to have progressive disease on a PET scan in May 2017 and was subsequently switched to [facility].   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  PLAN:    1.  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 Patient is tolerating Lonsurf well without significant side effects. Patient states she fell count has now recovered therefore he will proceed with  cycle 3 of Lonsurf today. He is taking 35 mg/m of Lonsurf on days 1 through 5 and days 8 through 12 with 2 weeks off.  Return to clinic in 2 weeks for laboratory work and then in 4 weeks for consideration of initiation of cycle 4. Will reimage at the conclusion of cycle 4.  2. Neutropenia: Resolved. Secondary to treatment.  3. Anemia: Mild, secondary treatment. Monitor. 4. Thrombocytopenia: Resolved.. Monitor. 5. 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 We will continue to monitor with routine imaging.   Patient expressed understanding and was in agreement with this plan. He also understands that He can call clinic at any time with any questions, concerns, or complaints.   Carcinoma of sigmoid colon [facility])   Staging form: Colon and Rectum, AJCC 7th Edition   - Clinical: Stage IVB (yT3, N1a, M1b) - Unsigned  Lloyd Huger, MD   11/02/2015 9:56 PM

## 2015-11-03 ENCOUNTER — Inpatient Hospital Stay: Payer: Medicare Other | Admitting: Oncology

## 2015-11-03 ENCOUNTER — Inpatient Hospital Stay: Payer: Medicare Other

## 2015-11-03 NOTE — Progress Notes (Signed)
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  INTERVAL HISTORY: Patient returns to clinic today for further evaluation, laboratory work, and consideration of initiating cycle 4 of Lonsurf. He is tolerating his treatments well without significant side effects. He has no neurologic complaints. He denies any recent fevers or illnesses. He has a good appetite and denies weight loss. He has no chest pain or shortness of breath. He denies any nausea, vomiting, constipation, or diarrhea. He has no urinary complaints. Patient feels at his baseline and offers no specific complaints today.  REVIEW OF SYSTEMS:   Review of Systems  Constitutional: Negative.  Negative for fever, malaise/fatigue and weight loss.  Respiratory: Negative.  Negative for cough and shortness of breath.   Cardiovascular: Negative.  Negative for chest pain.  Gastrointestinal: Negative.  Negative for abdominal pain.  Musculoskeletal: Negative.   Neurological: Negative.  Negative for weakness.  Psychiatric/Behavioral: Negative.     As per HPI. Otherwise, a complete review of systems is negative.  PAST MEDICAL HISTORY: Past Medical History:  Diagnosis Date  . Brain cancer ([place]) 01/2014   Chemotherapy and radiation treatment  . Colon cancer ([place]) 01/2014  . Kidney stones 1975  . Liver cancer ([place])   . Lung cancer [facility]) 2014   Radiation therapy  . Prostate cancer [facility]) 2005   treated by Dr Eliberto Ivory    PAST SURGICAL HISTORY: Past Surgical History:  Procedure Laterality Date  . COLON SURGERY  05-03-14   Resection of Sigmoid Colon and liver biopsy  . kidney stones  1975  . PORTACATH PLACEMENT  2014  . PROSTATE SURGERY  2005   partial removal due to cancer    FAMILY HISTORY: Family History  Problem Relation Age of Onset  . Lung  cancer Father   . Alzheimer's disease Mother        ADVANCED DIRECTIVES:    HEALTH MAINTENANCE: Social History  Substance Use Topics  . Smoking status: Former Smoker    Years: 35.00  . Smokeless tobacco: Never Used  . Alcohol use No     Colonoscopy:  PAP:  Bone density:  Lipid panel:  No Known Allergies  Current Outpatient Prescriptions  Medication Sig Dispense Refill  . ibuprofen (ADVIL,MOTRIN) 200 MG tablet Take 200 mg by mouth every 6 (six) hours as needed.    Marland Kitchen oxyCODONE (OXY IR/ROXICODONE) 5 MG immediate release tablet Take 5 mg by mouth every 6 (six) hours as needed.     Marland Kitchen PROVENTIL HFA 108 (90 BASE) MCG/ACT inhaler Inhale 90 mcg into the lungs every 6 (six) hours as needed.    Marland Kitchen trifluridine-tipiracil (LONSURF) 15-6.14 MG tablet Take 2 tabs by mouth twice a day Monday-Friday for 2 weeks, then 2 weeks off. Total dose 35m. 40 tablet 3  . trifluridine-tipiracil (LONSURF) 20-8.19 MG tablet Take 2 tabs by mouth twice a day Monday-Friday for 2 weeks, then 2 weeks off. Total dose 71m 40 tablet 3   No current facility-administered medications for this visit.    Facility-Administered Medications Ordered in Other Visits  Medication Dose Route Frequency Provider Last Rate Last Dose  . sodium chloride 0.9 % injection 10 mL  10 mL Intracatheter PRN JaForest GleasonMD   10 mL at 07/10/14 1428    OBJECTIVE: Vitals:   11/05/15 1018  BP: 134/79  Pulse: (!) 108  Resp: 18  Temp: (!) 95.9 F (35.5 C)     Body mass index is 18.99 kg/m.    ECOG FS:0 - Asymptomatic  General: Well-developed, well-nourished, no acute distress. Eyes: Pink conjunctiva, anicteric sclera. Lungs: Clear to auscultation bilaterally. Heart: Regular rate and rhythm. No rubs, murmurs, or gallops. Abdomen: Soft, nontender, nondistended. No organomegaly noted, normoactive bowel sounds. Musculoskeletal: No edema, cyanosis, or clubbing. Neuro: Alert, answering all questions appropriately. Cranial nerves  grossly intact. Skin: No rashes or petechiae noted. Psych: Normal affect.    LAB RESULTS:  Lab Results  Component Value Date   NA 137 11/05/2015   K 3.7 11/05/2015   CL 107 11/05/2015   CO2 24 11/05/2015   GLUCOSE 105 (H) 11/05/2015   BUN 9 11/05/2015   CREATININE 0.87 11/05/2015   CALCIUM 9.0 11/05/2015   PROT 8.2 (H) 11/05/2015   ALBUMIN 3.8 11/05/2015   AST 39 11/05/2015   ALT 32 11/05/2015   ALKPHOS 143 (H) 11/05/2015   BILITOT 0.7 11/05/2015   GFRNONAA >60 11/05/2015   GFRAA >60 11/05/2015    Lab Results  Component Value Date   WBC 3.8 11/05/2015   NEUTROABS 1.1 (L) 11/05/2015   HGB 8.1 (L) 11/05/2015   HCT 23.8 (L) 11/05/2015   MCV 90.5 11/05/2015   PLT 91 (L) 11/05/2015   Lab Results  Component Value Date   CEA 319.0 (H) 09/26/2015     STUDIES: No results found.  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 He received carboplatinum and etoposide along with XRT completing in approximately January 2015. Patient had a recurrence in his liver in September 2015 and was reinitiated on treatment with carboplatinum and etoposide again. He was then diagnosed with adenocarcinoma of the sigmoid colon with biopsy-proven metastasis to the liver in March 2016. K-ras wild-type. Patient initiated FOLFOX and Vectibix in April 2016. Patient was found to have a complete response, therefore was switched to Vectibix maintenance therapy in February 2017. Patient was then found to have progressive disease on a PET scan in May 2017 and was subsequently switched to [facility].   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  PLAN:    1.  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 Patient is tolerating Lonsurf well without significant side effectsProceed with cycle 3 of Lonsurf. Patient  actually initiated his treatment several days earlier last Sunday.  He is taking 35 mg/m of Lonsurf on days 1 through 5 and days 8 through 12 with 2 weeks off.  Return to clinic in 4 weeks for repeat PET scan and further evaluation. Patient was instructed not to initiate cycle 5 until he is evaluated and discusses the result of his imaging.   2. Neutropenia: Secondary to treatment. Monitor closely while on treatment.  3. Anemia: Mild, secondary treatment. Monitor. 4. Thrombocytopenia: Resolved. Monitor. 5. 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 We will continue to monitor with routine imaging.   Patient expressed understanding and was in agreement with this plan. He also understands that He can call clinic at any time with any questions, concerns, or complaints.   Carcinoma of sigmoid colon [facility])   Staging form: Colon and Rectum, AJCC 7th Edition   - Clinical: Stage IVB (yT3, N1a, M1b) - Unsigned  Lloyd Huger, MD   11/05/2015 1:20 PM

## 2015-11-05 ENCOUNTER — Inpatient Hospital Stay: Payer: Medicare Other

## 2015-11-05 ENCOUNTER — Inpatient Hospital Stay (HOSPITAL_BASED_OUTPATIENT_CLINIC_OR_DEPARTMENT_OTHER): Payer: Medicare Other | Admitting: Oncology

## 2015-11-05 VITALS — BP 134/79 | HR 108 | Temp 95.9°F | Resp 18 | Wt 147.9 lb

## 2015-11-05 DIAGNOSIS — Z79899 Other long term (current) drug therapy: Secondary | ICD-10-CM

## 2015-11-05 DIAGNOSIS — T451X5S Adverse effect of antineoplastic and immunosuppressive drugs, sequela: Secondary | ICD-10-CM

## 2015-11-05 DIAGNOSIS — Z9221 Personal history of antineoplastic chemotherapy: Secondary | ICD-10-CM

## 2015-11-05 DIAGNOSIS — D701 Agranulocytosis secondary to cancer chemotherapy: Secondary | ICD-10-CM

## 2015-11-05 DIAGNOSIS — C787 Secondary malignant neoplasm of liver and intrahepatic bile duct: Secondary | ICD-10-CM | POA: Diagnosis not present

## 2015-11-05 DIAGNOSIS — C187 Malignant neoplasm of sigmoid colon: Secondary | ICD-10-CM | POA: Diagnosis not present

## 2015-11-05 DIAGNOSIS — Z8546 Personal history of malignant neoplasm of prostate: Secondary | ICD-10-CM

## 2015-11-05 DIAGNOSIS — Z87891 Personal history of nicotine dependence: Secondary | ICD-10-CM

## 2015-11-05 DIAGNOSIS — D6481 Anemia due to antineoplastic chemotherapy: Secondary | ICD-10-CM | POA: Diagnosis not present

## 2015-11-05 DIAGNOSIS — Z923 Personal history of irradiation: Secondary | ICD-10-CM

## 2015-11-05 DIAGNOSIS — Z87442 Personal history of urinary calculi: Secondary | ICD-10-CM

## 2015-11-05 DIAGNOSIS — Z801 Family history of malignant neoplasm of trachea, bronchus and lung: Secondary | ICD-10-CM

## 2015-11-05 DIAGNOSIS — Z85841 Personal history of malignant neoplasm of brain: Secondary | ICD-10-CM

## 2015-11-05 LAB — CBC WITH DIFFERENTIAL/PLATELET
BASOS ABS: 0 10*3/uL (ref 0–0.1)
BASOS PCT: 0 %
EOS ABS: 0 10*3/uL (ref 0–0.7)
Eosinophils Relative: 0 %
HEMATOCRIT: 23.8 % — AB (ref 40.0–52.0)
HEMOGLOBIN: 8.1 g/dL — AB (ref 13.0–18.0)
Lymphocytes Relative: 62 %
Lymphs Abs: 2.4 10*3/uL (ref 1.0–3.6)
MCH: 30.7 pg (ref 26.0–34.0)
MCHC: 33.9 g/dL (ref 32.0–36.0)
MCV: 90.5 fL (ref 80.0–100.0)
Monocytes Absolute: 0.3 10*3/uL (ref 0.2–1.0)
Monocytes Relative: 9 %
NEUTROS ABS: 1.1 10*3/uL — AB (ref 1.4–6.5)
NEUTROS PCT: 29 %
Platelets: 91 10*3/uL — ABNORMAL LOW (ref 150–440)
RBC: 2.63 MIL/uL — AB (ref 4.40–5.90)
RDW: 24.2 % — ABNORMAL HIGH (ref 11.5–14.5)
WBC: 3.8 10*3/uL (ref 3.8–10.6)

## 2015-11-05 LAB — COMPREHENSIVE METABOLIC PANEL
ALBUMIN: 3.8 g/dL (ref 3.5–5.0)
ALK PHOS: 143 U/L — AB (ref 38–126)
ALT: 32 U/L (ref 17–63)
AST: 39 U/L (ref 15–41)
Anion gap: 6 (ref 5–15)
BILIRUBIN TOTAL: 0.7 mg/dL (ref 0.3–1.2)
BUN: 9 mg/dL (ref 6–20)
CALCIUM: 9 mg/dL (ref 8.9–10.3)
CO2: 24 mmol/L (ref 22–32)
Chloride: 107 mmol/L (ref 101–111)
Creatinine, Ser: 0.87 mg/dL (ref 0.61–1.24)
GFR calc Af Amer: >60 mL/min (ref >60)
GFR calc non Af Amer: >60 mL/min (ref >60)
GLUCOSE: 105 mg/dL — AB (ref 65–99)
POTASSIUM: 3.7 mmol/L (ref 3.5–5.1)
SODIUM: 137 mmol/L (ref 135–145)
TOTAL PROTEIN: 8.2 g/dL — AB (ref 6.5–8.1)

## 2015-11-05 NOTE — Progress Notes (Signed)
States has occasional shortness of breath with exertion while taking lonsurf. Shortness of breath resolves when pt is on break from lonsurf. Starting cycle of lonsurf on 11/02/15. Has not missed any doses since that time.

## 2015-11-18 ENCOUNTER — Telehealth: Payer: Self-pay | Admitting: *Deleted

## 2015-11-18 NOTE — Telephone Encounter (Signed)
Called to report that he is having increased SOB "when I lift anything, get up or anything", he is audibly wheezing when taking a breath to speak to me.  I spoke with Dr Grayland Ormond and he asks that the patient go to Urgent care for evaluation and CXR. Patient in agreement with going to Urgent Care.

## 2015-11-23 ENCOUNTER — Inpatient Hospital Stay
Admission: EM | Admit: 2015-11-23 | Discharge: 2015-11-25 | DRG: 871 | Disposition: A | Discharge status: Home / Self Care | Payer: Medicare Other | Attending: Specialist | Admitting: Specialist

## 2015-11-23 ENCOUNTER — Emergency Department: Payer: Medicare Other

## 2015-11-23 ENCOUNTER — Encounter: Payer: Self-pay | Admitting: Emergency Medicine

## 2015-11-23 DIAGNOSIS — R0602 Shortness of breath: Secondary | ICD-10-CM | POA: Diagnosis not present

## 2015-11-23 DIAGNOSIS — Z87891 Personal history of nicotine dependence: Secondary | ICD-10-CM

## 2015-11-23 DIAGNOSIS — Z23 Encounter for immunization: Secondary | ICD-10-CM

## 2015-11-23 DIAGNOSIS — R0902 Hypoxemia: Secondary | ICD-10-CM | POA: Diagnosis not present

## 2015-11-23 DIAGNOSIS — D61818 Other pancytopenia: Secondary | ICD-10-CM | POA: Diagnosis not present

## 2015-11-23 DIAGNOSIS — D630 Anemia in neoplastic disease: Secondary | ICD-10-CM | POA: Diagnosis not present

## 2015-11-23 DIAGNOSIS — C189 Malignant neoplasm of colon, unspecified: Secondary | ICD-10-CM | POA: Diagnosis not present

## 2015-11-23 DIAGNOSIS — A419 Sepsis, unspecified organism: Principal | ICD-10-CM | POA: Diagnosis present

## 2015-11-23 DIAGNOSIS — Z85038 Personal history of other malignant neoplasm of large intestine: Secondary | ICD-10-CM

## 2015-11-23 DIAGNOSIS — Z85841 Personal history of malignant neoplasm of brain: Secondary | ICD-10-CM

## 2015-11-23 DIAGNOSIS — Z8505 Personal history of malignant neoplasm of liver: Secondary | ICD-10-CM

## 2015-11-23 DIAGNOSIS — C787 Secondary malignant neoplasm of liver and intrahepatic bile duct: Secondary | ICD-10-CM | POA: Diagnosis present

## 2015-11-23 DIAGNOSIS — R6520 Severe sepsis without septic shock: Secondary | ICD-10-CM | POA: Diagnosis present

## 2015-11-23 DIAGNOSIS — J9601 Acute respiratory failure with hypoxia: Secondary | ICD-10-CM | POA: Diagnosis not present

## 2015-11-23 DIAGNOSIS — J440 Chronic obstructive pulmonary disease with acute lower respiratory infection: Secondary | ICD-10-CM | POA: Diagnosis present

## 2015-11-23 DIAGNOSIS — Z8546 Personal history of malignant neoplasm of prostate: Secondary | ICD-10-CM

## 2015-11-23 DIAGNOSIS — Z85118 Personal history of other malignant neoplasm of bronchus and lung: Secondary | ICD-10-CM

## 2015-11-23 DIAGNOSIS — J189 Pneumonia, unspecified organism: Secondary | ICD-10-CM | POA: Diagnosis not present

## 2015-11-23 DIAGNOSIS — Z923 Personal history of irradiation: Secondary | ICD-10-CM

## 2015-11-23 DIAGNOSIS — R0600 Dyspnea, unspecified: Secondary | ICD-10-CM | POA: Diagnosis not present

## 2015-11-23 DIAGNOSIS — D649 Anemia, unspecified: Secondary | ICD-10-CM | POA: Diagnosis not present

## 2015-11-23 DIAGNOSIS — Z801 Family history of malignant neoplasm of trachea, bronchus and lung: Secondary | ICD-10-CM

## 2015-11-23 LAB — BASIC METABOLIC PANEL
Anion gap: 7 (ref 5–15)
BUN: 18 mg/dL (ref 6–20)
CALCIUM: 8.6 mg/dL — AB (ref 8.9–10.3)
CHLORIDE: 101 mmol/L (ref 101–111)
CO2: 25 mmol/L (ref 22–32)
Creatinine, Ser: 0.86 mg/dL (ref 0.61–1.24)
GFR calc Af Amer: >60 mL/min (ref >60)
GLUCOSE: 99 mg/dL (ref 65–99)
Potassium: 3.5 mmol/L (ref 3.5–5.1)
SODIUM: 133 mmol/L — AB (ref 135–145)

## 2015-11-23 LAB — CBC
HCT: 21.2 % — ABNORMAL LOW (ref 40.0–52.0)
Hemoglobin: 7.3 g/dL — ABNORMAL LOW (ref 13.0–18.0)
MCH: 31.4 pg (ref 26.0–34.0)
MCHC: 34.4 g/dL (ref 32.0–36.0)
MCV: 91.3 fL (ref 80.0–100.0)
PLATELETS: 80 10*3/uL — AB (ref 150–440)
RBC: 2.32 MIL/uL — ABNORMAL LOW (ref 4.40–5.90)
RDW: 23.5 % — AB (ref 11.5–14.5)
WBC: 2.5 10*3/uL — ABNORMAL LOW (ref 3.8–10.6)

## 2015-11-23 LAB — BRAIN NATRIURETIC PEPTIDE: B NATRIURETIC PEPTIDE 5: 41 pg/mL (ref 0.0–100.0)

## 2015-11-23 LAB — TROPONIN I

## 2015-11-23 MED ORDER — DEXTROSE 5 % IV SOLN
500.0000 mg | Freq: Once | INTRAVENOUS | Status: AC
  Administered 2015-11-24: 500 mg via INTRAVENOUS
  Filled 2015-11-23: qty 500

## 2015-11-23 MED ORDER — IOPAMIDOL (ISOVUE-370) INJECTION 76%
75.0000 mL | Freq: Once | INTRAVENOUS | Status: AC | PRN
  Administered 2015-11-23: 75 mL via INTRAVENOUS

## 2015-11-23 MED ORDER — DEXTROSE 5 % IV SOLN
1.0000 g | Freq: Once | INTRAVENOUS | Status: AC
  Administered 2015-11-24: 1 g via INTRAVENOUS
  Filled 2015-11-23: qty 10

## 2015-11-23 MED ORDER — SODIUM CHLORIDE 0.9 % IV SOLN
10.0000 mL/h | Freq: Once | INTRAVENOUS | Status: AC
  Administered 2015-11-24: 10 mL/h via INTRAVENOUS

## 2015-11-23 NOTE — ED Triage Notes (Signed)
Patient with complaint of shortness of breath for 2- 3 days. Patient states that he gets short of breath just taking a few steps. Patient has lung cancer.

## 2015-11-23 NOTE — ED Provider Notes (Signed)
[facility] Emergency Department Provider Note ____________________________________________   I have reviewed the triage vital signs and the triage nursing note.  HISTORY  Chief Complaint Shortness of Breath   Historian Patient  HPI [name] is a 75 y.o. male with a history of small cell lung cancer, followed by metastatic colon cancer for which she completed radiation several months ago, is here today due to shortness of breath especially with exerting himself just a few steps which started about 5 days ago. No chest pain or fever or coughing. O2 sat in triage found to be in the 70s.  Never has used oxygen at home before. Symptoms are moderate.    Past Medical History:  Diagnosis Date  . Brain cancer ([place]) 01/2014   Chemotherapy and radiation treatment  . Colon cancer ([place]) 01/2014  . Kidney stones 1975  . Liver cancer ([place])   . Lung cancer [facility]) 2014   Radiation therapy  . Prostate cancer [facility]) 2005   treated by Dr Eliberto Ivory    Patient Active Problem List   Diagnosis Date Noted  . 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 COLON SURGERY  05-03-14   Resection of Sigmoid Colon and liver biopsy  . kidney stones  1975  . PORTACATH PLACEMENT  2014  . PROSTATE SURGERY  2005   partial removal due to cancer    Prior to Admission medications   Medication Sig Start Date End Date Taking? Authorizing Provider  ibuprofen (ADVIL,MOTRIN) 200 MG tablet Take 200 mg by mouth every 6 (six) hours as needed.    Historical Provider, MD  oxyCODONE (OXY IR/ROXICODONE) 5 MG immediate release tablet Take 5 mg by mouth every 6 (six) hours as needed.  05/10/14   Historical Provider, MD  PROVENTIL HFA 108 (90 BASE) MCG/ACT inhaler Inhale 90 mcg into the lungs every 6 (six) hours as needed. 06/10/14   Historical Provider, MD  trifluridine-tipiracil (LONSURF) 15-6.14 MG tablet  Take 2 tabs by mouth twice a day Monday-Friday for 2 weeks, then 2 weeks off. Total dose '70mg'$ . 07/11/15   [name], MD  trifluridine-tipiracil (LONSURF) 20-8.19 MG tablet Take 2 tabs by mouth twice a day Monday-Friday for 2 weeks, then 2 weeks off. Total dose '70mg'$ . 07/11/15   [name], MD    No Known Allergies  Family History  Problem Relation Age of Onset  . Lung cancer Father   . Alzheimer's disease Mother     Social History Social History  Substance Use Topics  . Smoking status: Former Smoker    Years: 35.00  . Smokeless tobacco: Never Used  . Alcohol use No    Review of Systems  Constitutional: Negative for fever. Eyes: Negative for visual changes. ENT: Negative for sore throat. Cardiovascular: Negative for chest pain. Respiratory: Positive for shortness of breath. Gastrointestinal: Negative for abdominal pain, vomiting and diarrhea. Genitourinary: Negative for dysuria. Musculoskeletal: Negative for back pain. Skin: Negative for rash. Neurological: Negative for headache. 10 point Review of Systems otherwise negative ____________________________________________   PHYSICAL EXAM:  VITAL SIGNS: ED Triage Vitals  Enc Vitals Group     BP 11/23/15 2135 104/66     Pulse Rate 11/23/15 2135 (!) 114     Resp 11/23/15 2135 (!) 24     Temp 11/23/15 2135 98.3 F (36.8 C)     Temp Source 11/23/15 2135 Oral     SpO2  11/23/15 2135 95 %     Weight 11/23/15 2131 154 lb (69.9 kg)     Height 11/23/15 2131 '6\' 2"'$  (1.88 m)     Head Circumference --      Peak Flow --      Pain Score --      Pain Loc --      Pain Edu? --      Excl. in [place]? --      Constitutional: Alert and oriented. Well appearing and in no distress. HEENT   Head: Normocephalic and atraumatic.      Eyes: Conjunctivae are normal. PERRL. Normal extraocular movements.      Ears:         Nose: No congestion/rhinnorhea.   Mouth/Throat: Mucous membranes are moist.   Neck: No  stridor. Cardiovascular/Chest: Tachycardic, regular rhythm.  No murmurs, rubs, or gallops. Respiratory: Normal respiratory effort without tachypnea nor retractions. Decreased breath sounds throughout without wheezing or rales. Gastrointestinal: Soft. No distention, no guarding, no rebound. Nontender.    Genitourinary/rectal:  Deferred Musculoskeletal: Nontender with normal range of motion in all extremities. No joint effusions.  No lower extremity tenderness.  No edema. Neurologic:  Normal speech and language. No gross or focal neurologic deficits are appreciated. Skin:  Skin is warm, dry and intact. No rash noted. Psychiatric: Mood and affect are normal. Speech and behavior are normal. Patient exhibits appropriate insight and judgment.   ____________________________________________  LABS (pertinent positives/negatives)  Labs Reviewed  BASIC METABOLIC PANEL - Abnormal; Notable for the following:       Result Value   Sodium 133 (*)    Calcium 8.6 (*)    All other components within normal limits  CBC - Abnormal; Notable for the following:    WBC 2.5 (*)    RBC 2.32 (*)    Hemoglobin 7.3 (*)    HCT 21.2 (*)    RDW 23.5 (*)    Platelets 80 (*)    All other components within normal limits  CULTURE, BLOOD (ROUTINE X 2)  CULTURE, BLOOD (ROUTINE X 2)  TROPONIN I  BRAIN NATRIURETIC PEPTIDE  TYPE AND SCREEN  PREPARE RBC (CROSSMATCH)    ____________________________________________    EKG I, Lisa Roca, MD, the attending physician have personally viewed and interpreted all ECGs.  118 bpm. Sinus tachycardia. Narrow QRS. Normal axis. Nonspecific T-wave ____________________________________________  RADIOLOGY All Xrays were viewed by me. Imaging interpreted by Radiologist.  Chest x-ray two-view:  IMPRESSION: 1. Ill-defined patchy opacity at the left lung base, may be atelectasis or pneumonia in the appropriate clinical setting. Given malignancy history, neoplastic etiology is  not entirely excluded but felt less likely. Recommend continued radiographic follow-up after course of treatment. 2. Chronic treatment related change in the right lung.  CT angios chest: Pending __________________________________________  PROCEDURES  Procedure(s) performed: None  Critical Care performed: CRITICAL CARE Performed by: Lisa Roca   Total critical care time: 30 minutes  Critical care time was exclusive of separately billable procedures and treating other patients.  Critical care was necessary to treat or prevent imminent or life-threatening deterioration.  Critical care was time spent personally by me on the following activities: development of treatment plan with patient and/or surrogate as well as nursing, discussions with consultants, evaluation of patient's response to treatment, examination of patient, obtaining history from patient or surrogate, ordering and performing treatments and interventions, ordering and review of laboratory studies, ordering and review of radiographic studies, pulse oximetry and re-evaluation of patient's condition.  ____________________________________________   ED COURSE / ASSESSMENT AND PLAN  Pertinent labs & imaging results that were available during my care of the patient were reviewed by me and considered in my medical decision making (see chart for details).   Mr. Heinzman is here for shortness of breath and was found to be 70% 02 sat on room air. He is placed on 2 L nasal cannula and O2 sat is into the mid 90s.  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  While at radiology standing for his x-ray on 2 L nasal cannula, patient became tachypnea and O2 sats were in the 70s. Upon sitting/lying and resting his O2 sats did come up to the mid 90s.  Hemoglobin came  back at 7.3, I will go ahead and order for 1 unit blood transfusion for symptomatic anemia. His white blood cell count is low but similar to prior. His platelet count is low but similar to prior.  X-ray similar to prior with respect to right-sided lung changes, but radiologist commented for possibility of atelectasis versus pneumonia or new malignancy as possibilities.  I did discuss with patient and family as the I am awaiting kidney function before ordering a CT to rule out pulmonary embolism. In either case, he is going to need to be admitted for his hypoxia.  BUN/Cr okay for CT.  Will add antibiotics for possibility of pulmonary infection.  I'll place on sepsis protocol due to tachycardia, no hypotension, lactate will need to be followed up in order to assess whether additional fluids are indicated from a sepsis standpoint.  I discussed with the hospitalist, pending CT. Discussed with Dr. Dineen Kid, transfer of emergency department care until the hospitalist after CT.     CONSULTATIONS: Hospitalist for admission.   Patient / Family / Caregiver informed of clinical course, medical decision-making process, and agree with plan.   ___________________________________________   FINAL CLINICAL IMPRESSION(S) / ED DIAGNOSES   Final diagnoses:  Hypoxia  Dyspnea, unspecified type              Note: This dictation was prepared with Dragon dictation. Any transcriptional errors that result from this process are unintentional    Lisa Roca, MD 11/23/15 2302

## 2015-11-24 DIAGNOSIS — C189 Malignant neoplasm of colon, unspecified: Secondary | ICD-10-CM | POA: Diagnosis present

## 2015-11-24 DIAGNOSIS — Z85841 Personal history of malignant neoplasm of brain: Secondary | ICD-10-CM | POA: Diagnosis not present

## 2015-11-24 DIAGNOSIS — Z23 Encounter for immunization: Secondary | ICD-10-CM | POA: Diagnosis not present

## 2015-11-24 DIAGNOSIS — A419 Sepsis, unspecified organism: Secondary | ICD-10-CM | POA: Diagnosis present

## 2015-11-24 DIAGNOSIS — J189 Pneumonia, unspecified organism: Secondary | ICD-10-CM | POA: Diagnosis present

## 2015-11-24 DIAGNOSIS — R0902 Hypoxemia: Secondary | ICD-10-CM | POA: Diagnosis not present

## 2015-11-24 DIAGNOSIS — Z8546 Personal history of malignant neoplasm of prostate: Secondary | ICD-10-CM | POA: Diagnosis not present

## 2015-11-24 DIAGNOSIS — D649 Anemia, unspecified: Secondary | ICD-10-CM | POA: Diagnosis not present

## 2015-11-24 DIAGNOSIS — Z85038 Personal history of other malignant neoplasm of large intestine: Secondary | ICD-10-CM | POA: Diagnosis not present

## 2015-11-24 DIAGNOSIS — Z8505 Personal history of malignant neoplasm of liver: Secondary | ICD-10-CM | POA: Diagnosis not present

## 2015-11-24 DIAGNOSIS — C799 Secondary malignant neoplasm of unspecified site: Secondary | ICD-10-CM | POA: Diagnosis not present

## 2015-11-24 DIAGNOSIS — C787 Secondary malignant neoplasm of liver and intrahepatic bile duct: Secondary | ICD-10-CM | POA: Diagnosis present

## 2015-11-24 DIAGNOSIS — Z801 Family history of malignant neoplasm of trachea, bronchus and lung: Secondary | ICD-10-CM | POA: Diagnosis not present

## 2015-11-24 DIAGNOSIS — Z923 Personal history of irradiation: Secondary | ICD-10-CM | POA: Diagnosis not present

## 2015-11-24 DIAGNOSIS — Z85118 Personal history of other malignant neoplasm of bronchus and lung: Secondary | ICD-10-CM | POA: Diagnosis not present

## 2015-11-24 DIAGNOSIS — J9601 Acute respiratory failure with hypoxia: Secondary | ICD-10-CM | POA: Diagnosis present

## 2015-11-24 DIAGNOSIS — D630 Anemia in neoplastic disease: Secondary | ICD-10-CM | POA: Diagnosis present

## 2015-11-24 DIAGNOSIS — Z87891 Personal history of nicotine dependence: Secondary | ICD-10-CM | POA: Diagnosis not present

## 2015-11-24 DIAGNOSIS — D61818 Other pancytopenia: Secondary | ICD-10-CM | POA: Diagnosis present

## 2015-11-24 DIAGNOSIS — J440 Chronic obstructive pulmonary disease with acute lower respiratory infection: Secondary | ICD-10-CM | POA: Diagnosis present

## 2015-11-24 DIAGNOSIS — R6520 Severe sepsis without septic shock: Secondary | ICD-10-CM | POA: Diagnosis present

## 2015-11-24 LAB — HEMOGLOBIN: Hemoglobin: 6.8 g/dL — ABNORMAL LOW (ref 13.0–18.0)

## 2015-11-24 LAB — ABO/RH: ABO/RH(D): O POS

## 2015-11-24 LAB — LACTIC ACID, PLASMA
Lactic Acid, Venous: 0.9 mmol/L (ref 0.5–1.9)
Lactic Acid, Venous: 2 mmol/L (ref 0.5–1.9)

## 2015-11-24 LAB — PREPARE RBC (CROSSMATCH)

## 2015-11-24 LAB — MRSA PCR SCREENING: MRSA BY PCR: NEGATIVE

## 2015-11-24 LAB — TSH: TSH: 0.295 u[IU]/mL — ABNORMAL LOW (ref 0.350–4.500)

## 2015-11-24 MED ORDER — SODIUM CHLORIDE 0.9 % IV SOLN
Freq: Once | INTRAVENOUS | Status: AC
  Administered 2015-11-24: 16:00:00 via INTRAVENOUS

## 2015-11-24 MED ORDER — MORPHINE SULFATE (PF) 2 MG/ML IV SOLN: 2.0000 mg | INTRAVENOUS | Status: DC | PRN

## 2015-11-24 MED ORDER — ALBUTEROL SULFATE (2.5 MG/3ML) 0.083% IN NEBU: 2.5000 mg | INHALATION_SOLUTION | RESPIRATORY_TRACT | Status: DC | PRN

## 2015-11-24 MED ORDER — VANCOMYCIN HCL IN DEXTROSE 1-5 GM/200ML-% IV SOLN
1000.0000 mg | Freq: Two times a day (BID) | INTRAVENOUS | Status: DC
  Administered 2015-11-24: 1000 mg via INTRAVENOUS
  Filled 2015-11-24 (×2): qty 200

## 2015-11-24 MED ORDER — ONDANSETRON HCL 4 MG PO TABS: 4.0000 mg | ORAL_TABLET | Freq: Four times a day (QID) | ORAL | Status: DC | PRN

## 2015-11-24 MED ORDER — DOCUSATE SODIUM 100 MG PO CAPS
100.0000 mg | ORAL_CAPSULE | Freq: Two times a day (BID) | ORAL | Status: DC
  Filled 2015-11-24 (×2): qty 1

## 2015-11-24 MED ORDER — OXYCODONE HCL 5 MG PO TABS: 5.0000 mg | ORAL_TABLET | Freq: Four times a day (QID) | ORAL | Status: DC | PRN

## 2015-11-24 MED ORDER — SODIUM CHLORIDE 0.9 % IV BOLUS (SEPSIS)
250.0000 mL | Freq: Once | INTRAVENOUS | Status: AC
  Administered 2015-11-24: 250 mL via INTRAVENOUS

## 2015-11-24 MED ORDER — INFLUENZA VAC SPLIT QUAD 0.5 ML IM SUSY
0.5000 mL | PREFILLED_SYRINGE | INTRAMUSCULAR | Status: AC
  Administered 2015-11-25: 0.5 mL via INTRAMUSCULAR
  Filled 2015-11-24: qty 0.5

## 2015-11-24 MED ORDER — LEVOFLOXACIN 750 MG PO TABS
750.0000 mg | ORAL_TABLET | Freq: Every day | ORAL | Status: DC
  Administered 2015-11-24: 750 mg via ORAL
  Filled 2015-11-24: qty 1

## 2015-11-24 MED ORDER — LEVOFLOXACIN IN D5W 750 MG/150ML IV SOLN: 750.0000 mg | INTRAVENOUS | Status: DC

## 2015-11-24 MED ORDER — VANCOMYCIN HCL IN DEXTROSE 1-5 GM/200ML-% IV SOLN
1000.0000 mg | Freq: Once | INTRAVENOUS | Status: AC
  Administered 2015-11-24: 09:00:00 1000 mg via INTRAVENOUS
  Filled 2015-11-24 (×3): qty 200

## 2015-11-24 MED ORDER — SODIUM CHLORIDE 0.9 % IV BOLUS (SEPSIS)
1000.0000 mL | Freq: Once | INTRAVENOUS | Status: AC
  Administered 2015-11-23: 1000 mL via INTRAVENOUS

## 2015-11-24 MED ORDER — ONDANSETRON HCL 4 MG/2ML IJ SOLN: 4.0000 mg | Freq: Four times a day (QID) | INTRAMUSCULAR | Status: DC | PRN

## 2015-11-24 MED ORDER — ACETAMINOPHEN 325 MG PO TABS: 650.0000 mg | ORAL_TABLET | Freq: Four times a day (QID) | ORAL | Status: DC | PRN

## 2015-11-24 MED ORDER — SODIUM CHLORIDE 0.9 % IV SOLN
INTRAVENOUS | Status: DC
  Administered 2015-11-24: 07:00:00 via INTRAVENOUS

## 2015-11-24 MED ORDER — ENOXAPARIN SODIUM 40 MG/0.4ML ~~LOC~~ SOLN
40.0000 mg | SUBCUTANEOUS | Status: DC
  Administered 2015-11-24: 22:00:00 40 mg via SUBCUTANEOUS
  Filled 2015-11-24: qty 0.4

## 2015-11-24 MED ORDER — ACETAMINOPHEN 650 MG RE SUPP: 650.0000 mg | Freq: Four times a day (QID) | RECTAL | Status: DC | PRN

## 2015-11-24 MED ORDER — LEVOFLOXACIN IN D5W 750 MG/150ML IV SOLN
750.0000 mg | INTRAVENOUS | Status: DC
  Filled 2015-11-24: qty 150

## 2015-11-24 MED ORDER — SODIUM CHLORIDE 0.9% FLUSH
3.0000 mL | Freq: Two times a day (BID) | INTRAVENOUS | Status: DC
  Administered 2015-11-24 – 2015-11-25 (×4): 3 mL via INTRAVENOUS

## 2015-11-24 MED ORDER — SODIUM CHLORIDE 0.9 % IV BOLUS (SEPSIS)
1000.0000 mL | Freq: Once | INTRAVENOUS | Status: AC
  Administered 2015-11-24: 1000 mL via INTRAVENOUS

## 2015-11-24 NOTE — Progress Notes (Signed)
[facility] [place] NAME: [name]    MR#:  [id number]  DATE OF BIRTH:  03-Sep-1940  SUBJECTIVE:   Patient here due to shortness of breath and suspected to have an pneumonia. Feels better. Also noted to anemic and given 1 unit of blood.  Ambulated this a.m. and got short of breath and heart rate dropped.    REVIEW OF SYSTEMS:    Review of Systems  Constitutional: Negative for chills and fever.  HENT: Negative for congestion and tinnitus.   Eyes: Negative for blurred vision and double vision.  Respiratory: Positive for shortness of breath (on exertion). Negative for cough and wheezing.   Cardiovascular: Negative for chest pain, orthopnea and PND.  Gastrointestinal: Negative for abdominal pain, diarrhea, nausea and vomiting.  Genitourinary: Negative for dysuria and hematuria.  Neurological: Negative for dizziness, sensory change and focal weakness.  All other systems reviewed and are negative.   Nutrition: Regular  Tolerating Diet: Yes Tolerating PT: Ambulatory     DRUG ALLERGIES:  No Known Allergies  VITALS:  Blood pressure (!) 95/54, pulse 79, temperature 98.1 F (36.7 C), temperature source Oral, resp. rate 16, height '6\' 2"'$  (1.88 m), weight 68.4 kg (150 lb 14.4 oz), SpO2 100 %.  PHYSICAL EXAMINATION:   Physical Exam  GENERAL:  75 y.o.-year-old patient lying in the bed in no acute distress.  EYES: Pupils equal, round, reactive to light and accommodation. No scleral icterus. Extraocular muscles intact.  HEENT: Head atraumatic, normocephalic. Oropharynx and nasopharynx clear.  NECK:  Supple, no jugular venous distention. No thyroid enlargement, no tenderness.  LUNGS: Normal breath sounds bilaterally, no wheezing, rales, rhonchi. No use of accessory muscles of respiration.  CARDIOVASCULAR: S1, S2 normal. No murmurs, rubs, or gallops.  ABDOMEN: Soft, nontender, nondistended. Bowel sounds present. No organomegaly or mass.   EXTREMITIES: No cyanosis, clubbing or edema b/l.    NEUROLOGIC: Cranial nerves II through XII are intact. No focal Motor or sensory deficits b/l.   PSYCHIATRIC: The patient is alert and oriented x 3.  SKIN: No obvious rash, lesion, or ulcer.    LABORATORY PANEL:   CBC  Recent Labs Lab 11/23/15 2151  WBC 2.5*  HGB 7.3*  HCT 21.2*  PLT 80*   ------------------------------------------------------------------------------------------------------------------  Chemistries   Recent Labs Lab 11/23/15 2151  NA 133*  K 3.5  CL 101  CO2 25  GLUCOSE 99  BUN 18  CREATININE 0.86  CALCIUM 8.6*   ------------------------------------------------------------------------------------------------------------------  Cardiac Enzymes  Recent Labs Lab 11/23/15 2151  TROPONINI <0.03   ------------------------------------------------------------------------------------------------------------------  RADIOLOGY:  Dg Chest 2 View  Result Date: 11/23/2015 CLINICAL DATA:  Shortness of breath. Symptoms for 4 days. History of multiple malignancies including lung, colon, prostate, brain and liver metastasis. EXAM: CHEST  2 VIEW COMPARISON:  Most recent chest imaging PET-CT 07/07/2015 FINDINGS: Tip of the left chest port in the SVC. Chronic treatment related changes in the right lung apex with associated right lung volume loss, unchanged. Left lung nodule on prior PET-CT not well seen radiographically. Minimal ill-defined patchy opacity at the left lung base, only appreciated on the AP view. Unchanged heart size and mediastinal contours. No pneumothorax or pleural fluid. No evidence of acute osseous abnormality. IMPRESSION: 1. Ill-defined patchy opacity at the left lung base, may be atelectasis or pneumonia in the appropriate clinical setting. Given malignancy history, neoplastic etiology is not entirely excluded but felt less likely. Recommend continued radiographic follow-up after course of treatment.  2.  Chronic treatment related change in the right lung. Electronically Signed   By: Jeb Levering M.D.   On: 11/23/2015 22:41   Ct Angio Chest Pe W/cm &/or Wo Cm  Result Date: 11/24/2015 CLINICAL DATA:  Shortness of breath and hypoxia. 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 EXAM: CT ANGIOGRAPHY CHEST WITH CONTRAST TECHNIQUE: Multidetector CT imaging of the chest was performed using the standard protocol during bolus administration of intravenous contrast. Multiplanar CT image reconstructions and MIPs were obtained to evaluate the vascular anatomy. CONTRAST:  75 cc Isovue 370 IV COMPARISON:  Chest radiograph earlier this day. PET-CT 07/07/2015, chest CT 03/19/2015. FINDINGS: Cardiovascular: No filling defects within the pulmonary arteries to suggest pulmonary embolus. Right lung pulmonary arteries are attenuated secondary to post treatment related change. Tortuous thoracic aorta with atherosclerosis, no evidence of dissection. Coronary artery calcifications are seen. Heart size is normal. Chest wall collaterals again noted. Left chest port in place. Mediastinum/Nodes: No mediastinal, hilar, or axillary adenopathy. No pericardial fluid. Minimal intraluminal debris within the esophagus which is nondilated. Lungs/Pleura: Stable appearance of posttreatment related change in the right upper lobe with dense upper lobe consolidation and right upper lobe bronchiectasis. Left upper lobe perifissural nodule measures 9 mm, unchanged. The left upper lobe nodule on prior chest CT is not visualized. An irregular 7 mm opacity in the left lower lobe series 7 image 86 was not seen on prior CT or PET. There is otherwise no focal abnormality at the left lung base corresponding to radiographic abnormality. Upper Abdomen: Calcified hepatic lesions are again seen. Many of the hepatic metastasis on prior PET are not well visualized due to phase of contrast timing. No definite acute abnormality.  Musculoskeletal: No destructive lytic or blastic osseous lesion is seen. Review of the MIP images confirms the above findings. IMPRESSION: 1. No pulmonary embolus. 2. Stable appearance of post treatment related change in the right upper lung. Stable left upper lobe pulmonary nodule measuring 9 mm. 3. New irregular nodular opacity in the subpleural left lower lobe measures 7 mm. Given malignancy history this is concerning for metastasis, however subpleural location is nonspecific. Electronically Signed   By: Jeb Levering M.D.   On: 11/24/2015 00:34     ASSESSMENT AND PLAN:   76 year old male with past medical history of lung cancer, prostate cancer, currently getting treatment for colon cancer who presented to the hospital due to shortness of breath.  1. Sepsis-patient presented to the hospital meeting criteria for sepsis given his leukopenia, tachypnea, tachycardia and elevated lactic acid. -Suspected source is pneumonia. Continue vancomycin, Levaquin for now. -Cultures so far negative. Clinically afebrile and hemodynamically stable.   2. Acute respiratory failure with hypoxia-secondary to pneumonia with underlying COPD and probably scarring from previous history of radiation treatment for lung cancer. -Continue oxygen supplementation, continue IV vancomycin, Levaquin. -Wean O2 as tolerated.  3. Pneumonia-continue vancomycin, Levaquin. Await MRSA PCR is negative can discontinue vancomycin.  4. Symptomatic anemia-patient given 1 unit of packed red blood cells. Will follow hemoglobin.   All the records are reviewed and case discussed with Care Management/Social Worker. Management plans discussed with the patient, family and they are in agreement.  CODE STATUS: Full  DVT Prophylaxis: Lovenox  TOTAL TIME TAKING CARE OF THIS PATIENT: 30 minutes.   POSSIBLE D/C IN 1-2 DAYS, DEPENDING ON CLINICAL CONDITION.   Henreitta Leber M.D on 11/24/2015 at 2:03 PM  Between 7am to 6pm - Pager -  [phone]  After 6pm go to [email or web address] -  password [employer]line pilot  [employer]  [facility]  Office  [phone]  CC: Primary care physician; Lloyd Huger, MD

## 2015-11-24 NOTE — H&P (Signed)
[name] is an 75 y.o. male.   Chief Complaint: Shortness of breath HPI: The patient with past medical history of metastatic cancer presents to the emergency department complaining of shortness of breath.  He states that he cannot walk more than a few feet without becoming very winded.  He denies fevers, nausea, vomiting or diarrhea.  Also denies chest pain.  The patient had desaturations into the 70's while standing up for xray. Questionable pneumonia versus atelectasis was seen on plain film. Also the patient was very tachycardic which prompted CTA to rule out pulmonary embolism. No filling defects were found, however the patient did have a new subpleural nodule not seen on previous CT scans. Laboratory evaluation showed leukopenia and the patient was significantly tachypneic. Blood cultures were obtained and broad-spectrum antibiotics initiated prior to the emergency department staff called the hospitalist service for admission.  Past Medical History:  Diagnosis Date  . Brain cancer ([place]) 01/2014   Chemotherapy and radiation treatment  . Colon cancer ([place]) 01/2014  . Kidney stones 1975  . Liver cancer ([place])   . Lung cancer [facility]) 2014   Radiation therapy  . Prostate cancer [facility]) 2005   treated by Dr Eliberto Ivory    Past Surgical History:  Procedure Laterality Date  . COLON SURGERY  05-03-14   Resection of Sigmoid Colon and liver biopsy  . kidney stones  1975  . PORTACATH PLACEMENT  2014  . PROSTATE SURGERY  2005   partial removal due to cancer    Family History  Problem Relation Age of Onset  . Lung cancer Father   . Alzheimer's disease Mother    Social History:  reports that he has quit smoking. He quit after 35.00 years of use. He has never used smokeless tobacco. He reports that he does not drink alcohol or use drugs.  Allergies: No Known Allergies   (Not in a hospital admission)  Results for orders placed or performed during the hospital encounter of 11/23/15 (from the past 48  hour(s))  Basic metabolic panel     Status: Abnormal   Collection Time: 11/23/15  9:51 PM  Result Value Ref Range   Sodium 133 (L) 135 - 145 mmol/L   Potassium 3.5 3.5 - 5.1 mmol/L   Chloride 101 101 - 111 mmol/L   CO2 25 22 - 32 mmol/L   Glucose, Bld 99 65 - 99 mg/dL   BUN 18 6 - 20 mg/dL   Creatinine, Ser 0.86 0.61 - 1.24 mg/dL   Calcium 8.6 (L) 8.9 - 10.3 mg/dL   GFR calc non Af Amer >60 >60 mL/min   GFR calc Af Amer >60 >60 mL/min    Comment: (NOTE) The eGFR has been calculated using the CKD EPI equation. This calculation has not been validated in all clinical situations. eGFR's persistently <60 mL/min signify possible Chronic Kidney Disease.    Anion gap 7 5 - 15  CBC     Status: Abnormal   Collection Time: 11/23/15  9:51 PM  Result Value Ref Range   WBC 2.5 (L) 3.8 - 10.6 K/uL   RBC 2.32 (L) 4.40 - 5.90 MIL/uL   Hemoglobin 7.3 (L) 13.0 - 18.0 g/dL   HCT 21.2 (L) 40.0 - 52.0 %   MCV 91.3 80.0 - 100.0 fL   MCH 31.4 26.0 - 34.0 pg   MCHC 34.4 32.0 - 36.0 g/dL   RDW 23.5 (H) 11.5 - 14.5 %   Platelets 80 (L) 150 - 440 K/uL  Troponin  I     Status: None   Collection Time: 11/23/15  9:51 PM  Result Value Ref Range   Troponin I <0.03 <0.03 ng/mL  Brain natriuretic peptide     Status: None   Collection Time: 11/23/15  9:51 PM  Result Value Ref Range   B Natriuretic Peptide 41.0 0.0 - 100.0 pg/mL  Type and screen [place]     Status: None (Preliminary result)   Collection Time: 11/23/15  9:51 PM  Result Value Ref Range   ABO/RH(D) O POS    Antibody Screen NEG    Sample Expiration 11/26/2015    Unit Number [id number]    Blood Component Type RBC, LR IRR    Unit division 00    Status of Unit ALLOCATED    Transfusion Status OK TO TRANSFUSE    Crossmatch Result Compatible   Prepare RBC     Status: None   Collection Time: 11/23/15  9:51 PM  Result Value Ref Range   Order Confirmation ORDER PROCESSED BY BLOOD BANK   ABO/Rh     Status: None    Collection Time: 11/23/15 11:42 PM  Result Value Ref Range   ABO/RH(D) O POS   Lactic acid, plasma     Status: Abnormal   Collection Time: 11/23/15 11:46 PM  Result Value Ref Range   Lactic Acid, Venous 2.0 (HH) 0.5 - 1.9 mmol/L    Comment: CRITICAL RESULT CALLED TO, READ BACK BY AND VERIFIED WITH SHANNON MARTIN AT 0040 ON 11/24/15 RWW    Dg Chest 2 View  Result Date: 11/23/2015 CLINICAL DATA:  Shortness of breath. Symptoms for 4 days. History of multiple malignancies including lung, colon, prostate, brain and liver metastasis. EXAM: CHEST  2 VIEW COMPARISON:  Most recent chest imaging PET-CT 07/07/2015 FINDINGS: Tip of the left chest port in the SVC. Chronic treatment related changes in the right lung apex with associated right lung volume loss, unchanged. Left lung nodule on prior PET-CT not well seen radiographically. Minimal ill-defined patchy opacity at the left lung base, only appreciated on the AP view. Unchanged heart size and mediastinal contours. No pneumothorax or pleural fluid. No evidence of acute osseous abnormality. IMPRESSION: 1. Ill-defined patchy opacity at the left lung base, may be atelectasis or pneumonia in the appropriate clinical setting. Given malignancy history, neoplastic etiology is not entirely excluded but felt less likely. Recommend continued radiographic follow-up after course of treatment. 2. Chronic treatment related change in the right lung. Electronically Signed   By: Jeb Levering M.D.   On: 11/23/2015 22:41   Ct Angio Chest Pe W/cm &/or Wo Cm  Result Date: 11/24/2015 CLINICAL DATA:  Shortness of breath and hypoxia. 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 EXAM: CT ANGIOGRAPHY CHEST WITH CONTRAST TECHNIQUE: Multidetector CT imaging of the chest was performed using the standard protocol during bolus administration of intravenous contrast. Multiplanar CT image reconstructions and MIPs were obtained to evaluate the vascular anatomy. CONTRAST:   75 cc Isovue 370 IV COMPARISON:  Chest radiograph earlier this day. PET-CT 07/07/2015, chest CT 03/19/2015. FINDINGS: Cardiovascular: No filling defects within the pulmonary arteries to suggest pulmonary embolus. Right lung pulmonary arteries are attenuated secondary to post treatment related change. Tortuous thoracic aorta with atherosclerosis, no evidence of dissection. Coronary artery calcifications are seen. Heart size is normal. Chest wall collaterals again noted. Left chest port in place. Mediastinum/Nodes: No mediastinal, hilar, or axillary adenopathy. No pericardial fluid. Minimal intraluminal debris within the esophagus which  is nondilated. Lungs/Pleura: Stable appearance of posttreatment related change in the right upper lobe with dense upper lobe consolidation and right upper lobe bronchiectasis. Left upper lobe perifissural nodule measures 9 mm, unchanged. The left upper lobe nodule on prior chest CT is not visualized. An irregular 7 mm opacity in the left lower lobe series 7 image 86 was not seen on prior CT or PET. There is otherwise no focal abnormality at the left lung base corresponding to radiographic abnormality. Upper Abdomen: Calcified hepatic lesions are again seen. Many of the hepatic metastasis on prior PET are not well visualized due to phase of contrast timing. No definite acute abnormality. Musculoskeletal: No destructive lytic or blastic osseous lesion is seen. Review of the MIP images confirms the above findings. IMPRESSION: 1. No pulmonary embolus. 2. Stable appearance of post treatment related change in the right upper lung. Stable left upper lobe pulmonary nodule measuring 9 mm. 3. New irregular nodular opacity in the subpleural left lower lobe measures 7 mm. Given malignancy history this is concerning for metastasis, however subpleural location is nonspecific. Electronically Signed   By: Jeb Levering M.D.   On: 11/24/2015 00:34    Review of Systems  Constitutional: Negative  for chills and fever.  HENT: Negative for sore throat and tinnitus.   Eyes: Negative for blurred vision and redness.  Respiratory: Positive for shortness of breath. Negative for cough.   Cardiovascular: Negative for chest pain, palpitations, orthopnea and PND.  Gastrointestinal: Negative for abdominal pain, diarrhea, nausea and vomiting.  Genitourinary: Negative for dysuria, frequency and urgency.  Musculoskeletal: Negative for joint pain and myalgias.  Skin: Negative for rash.       No lesions  Neurological: Negative for speech change, focal weakness and weakness.  Endo/Heme/Allergies: Does not bruise/bleed easily.       No temperature intolerance  Psychiatric/Behavioral: Negative for depression and suicidal ideas.    Blood pressure 106/75, pulse 89, temperature 98.6 F (37 C), temperature source Oral, resp. rate (!) 24, height _0  (1.88 m), weight 69.9 kg (154 lb), SpO2 100 %. Physical Exam  Nursing note and vitals reviewed. Constitutional: He is oriented to person, place, and time. He appears well-developed and well-nourished. No distress.  HENT:  Head: Normocephalic and atraumatic.  Mouth/Throat: Oropharynx is clear and moist. No oropharyngeal exudate.  Eyes: Conjunctivae and EOM are normal. Pupils are equal, round, and reactive to light. No scleral icterus.  Neck: Normal range of motion. Neck supple. No JVD present. No tracheal deviation present. No thyromegaly present.  Cardiovascular: Normal rate and regular rhythm.  Exam reveals no gallop and no friction rub.   No murmur heard. Respiratory: Effort normal and breath sounds normal. No respiratory distress.  GI: Soft. Bowel sounds are normal. He exhibits no distension.  Genitourinary:  Genitourinary Comments: Deferred  Musculoskeletal: Normal range of motion. He exhibits no edema.  Lymphadenopathy:    He has no cervical adenopathy.  Neurological: He is alert and oriented to person, place, and time. No cranial nerve deficit.   Skin: Skin is warm and dry. No rash noted. No erythema.  Psychiatric: He has a normal mood and affect. His behavior is normal. Judgment and thought content normal.     Assessment/Plan This is a 75 year old male admitted for sepsis. 1. Sepsis: The patient's criteria via tachypnea and leukopenia. Lactic acid is mildly elevated as well. The patient is received fluid resuscitation in the emergency department which we will continue. He has received ceftriaxone and azithromycin in the emergency  department for presumed pneumonia. He is hemodynamically stable. Follow-up blood cultures for growth and sensitivities. 2. Pneumonia: Due to the patient's immunocompromise I will adjust his antibiotic regimen to treat healthcare associated pneumonia. Add Levaquin and vancomycin. Etiology of shortness of breath may also include radiation fibrosis. 3. 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 He is currently off his chemotherapy at this time due to pancytopenia. New lung nodule not contributing to shortness of breath. May work up as an outpatient. 4. Symptomatic anemia: The patient is currently receiving 1 unit packed red blood cells. Continue to monitor telemetry. 5. DVT prophylaxis: Lovenox 6. GI prophylaxis: None The patient is a full code. Time spent on admission orders and patient care approximate 45 minutes  Harrie Foreman, MD 11/24/2015, 2:00 AM

## 2015-11-24 NOTE — ED Provider Notes (Signed)
Signout from Dr. Reita Cliche to follow-up with imaging as well as labs and admit the patient. Physical Exam  BP 99/69   Pulse 95   Temp 98.6 F (37 C) (Oral)   Resp (!) 24   Ht '6\' 2"'$  (1.88 m)   Wt 154 lb (69.9 kg)   SpO2 99%   BMI 19.77 kg/m   Physical Exam  ED Course  Procedures  MDM No PE on CAT scan of the chest. Possible pneumonia visualized on the x-ray of the chest. Signed out to Dr. Jannifer Franklin.       Orbie Pyo, MD 11/24/15 [phone]

## 2015-11-24 NOTE — ED Notes (Signed)
Pt back from ct, ct informed this RN that the iv infiltrated and that the pt needed an icepack to the left arm and to elevate, ice pack applied and pt's arm elevated

## 2015-11-24 NOTE — ED Notes (Signed)
Dr Marcille Blanco at bedside

## 2015-11-24 NOTE — Care Management Note (Signed)
Case Management Note  Patient Details  Name: [name] MRN: [id number] Date of Birth: 1940/04/22  Subjective/Objective:        75yo Adam [name] was admitted with sepsis per PNA. Metastatic colon cancer. Resides at home with his wife who is there 24/7 (retired). PCP=Dr [employer]. Pharmacy=[facility] on [address]. No home oxygen but anticipate discharge home with new home oxygen. No home assistive equipment. Wife is requesting a rolling walker at discharge. Wife will provide transportation to appointments. [name] and his wife chose [facility] to be their provider from a list of providers. Case management will follow for discharge planning.             Action/Plan:   Expected Discharge Date:                  Expected Discharge Plan:     In-House Referral:     Discharge planning Services     Post Acute Care Choice:    Choice offered to:     DME Arranged:    DME Agency:     HH Arranged:    HH Agency:     Status of Service:     If discussed at [employer] of Stay Meetings, dates discussed:    Additional Comments:  [name] A, RN 11/24/2015, 11:28 AM

## 2015-11-24 NOTE — Progress Notes (Signed)
Pharmacy Antibiotic Note  [name] is a 75 y.o. male admitted on 11/23/2015 with pneumonia.  Pharmacy has been consulted for vancomycin and Levaquin dosing.  Plan: DW 69.9kg  Vd 49L kei 0.067 hr-1  T1/2 10 hours Vancomycin 1 gram q 12 hours ordered with stacked dosing. Level before 5th dose. Goal trough 15-20.  Levaquin 750 mg IV q 24 hours ordered to start tomorrow, ~24 hours after azithromycin dose.  Height: '6\' 2"'$  (188 cm) Weight: 150 lb 14.4 oz (68.4 kg) IBW/kg (Calculated) : 82.2  Temp (24hrs), Avg:98.5 F (36.9 C), Min:98.3 F (36.8 C), Max:98.8 F (37.1 C)   Recent Labs Lab 11/23/15 2151 11/23/15 2346  WBC 2.5*  --   CREATININE 0.86  --   LATICACIDVEN  --  2.0*    Estimated Creatinine Clearance (by C-G formula based on SCr of 0.86 mg/dL) Male: 62.9 mL/min Male: 74 mL/min    No Known Allergies  Antimicrobials this admission: ceftriaxone >> vancomycin azithromycin  >> Levaquin  Dose adjustments this admission:   Microbiology results: 10/8 BCx: pending    10/8 CXR: L base opacity  Thank you for allowing pharmacy to be a part of this patient's care.  Darnelle Derrick S 11/24/2015 4:14 AM

## 2015-11-25 LAB — CBC
HEMATOCRIT: 22.9 % — AB (ref 40.0–52.0)
HEMOGLOBIN: 8 g/dL — AB (ref 13.0–18.0)
MCH: 30.6 pg (ref 26.0–34.0)
MCHC: 35.1 g/dL (ref 32.0–36.0)
MCV: 87.1 fL (ref 80.0–100.0)
Platelets: 30 10*3/uL — ABNORMAL LOW (ref 150–440)
RBC: 2.63 MIL/uL — ABNORMAL LOW (ref 4.40–5.90)
RDW: 21.1 % — AB (ref 11.5–14.5)
WBC: 5.3 10*3/uL (ref 3.8–10.6)

## 2015-11-25 LAB — HEMOGLOBIN A1C
Hgb A1c MFr Bld: 6.6 % — ABNORMAL HIGH (ref 4.8–5.6)
Mean Plasma Glucose: 143 mg/dL

## 2015-11-25 MED ORDER — LEVOFLOXACIN 750 MG PO TABS: 750.0000 mg | ORAL_TABLET | Freq: Every day | ORAL | 0 refills | Status: DC

## 2015-11-25 MED ORDER — HEPARIN SOD (PORK) LOCK FLUSH 100 UNIT/ML IV SOLN
500.0000 [IU] | Freq: Once | INTRAVENOUS | Status: AC
  Administered 2015-11-25: 500 [IU] via INTRAVENOUS
  Filled 2015-11-25: qty 5

## 2015-11-25 NOTE — Progress Notes (Signed)
Discussed discharge instructions and medications with pt and his wife. IVs removed and port deaccessed. All questions addressed. Pt transported home via car by his wife.

## 2015-11-25 NOTE — Care Management Note (Signed)
Case Management Note  Patient Details  Name: [name] MRN: [id number] Date of Birth: Oct 14, 1940  Subjective/Objective:       [name] from [facility] was notified that [name] is being discharged home today, chose [facility], and will need HH-PT and a RW.              Action/Plan:   Expected Discharge Date:                  Expected Discharge Plan:     In-House Referral:     Discharge planning Services     Post Acute Care Choice:    Choice offered to:     DME Arranged:    DME Agency:     HH Arranged:    HH Agency:     Status of Service:     If discussed at [employer] of Stay Meetings, dates discussed:    Additional Comments:  [name] A, RN 11/25/2015, 9:04 AM

## 2015-11-25 NOTE — Discharge Summary (Signed)
[place] [facility] [place] NAME: [name]    MR#:  [id number]  DATE OF BIRTH:  [birth year]  DATE OF ADMISSION:  11/23/2015 ADMITTING PHYSICIAN: Harrie Foreman, MD  DATE OF DISCHARGE: 11/25/2015  PRIMARY CARE PHYSICIAN: Lloyd Huger, MD    ADMISSION DIAGNOSIS:  Hypoxia [R09.02] Symptomatic anemia [D64.9] Dyspnea, unspecified type [R06.00]  DISCHARGE DIAGNOSIS:  Active Problems:   Sepsis ([place])   SECONDARY DIAGNOSIS:   Past Medical History:  Diagnosis Date  . Brain cancer ([place]) 01/2014   Chemotherapy and radiation treatment  . Colon cancer ([place]) 01/2014  . Kidney stones 1975  . Liver cancer ([place])   . Lung cancer [facility]) 2014   Radiation therapy  . Prostate cancer [facility]) 2005   treated by Dr Levy Pupa COURSE:   75 year old male with past medical history of lung cancer, prostate cancer, currently getting treatment for colon cancer who presented to the hospital due to shortness of breath.  1. Sepsis-patient presented to the hospital meeting criteria for sepsis given his leukopenia, tachypnea, tachycardia and elevated lactic acid. - this has been now ruled out and pt. Is clinically much better.  - his abx have been narrowed down to treat a CAP and he is being discharged home. His blood and sputum cultures remained negative.   2. Acute respiratory failure with hypoxia-secondary to pneumonia with underlying COPD and probably scarring from previous history of radiation treatment for lung cancer. -Patient was empirically treated with IV antibiotics for pneumonia and also given O2 supplementation. -He has clinically improved and is no longer hypoxic. He is being discharged on oral doxycycline for a few more days along with some prednisone.  3. Pneumonia-while in the hospital patient was treated with IV vancomycin, Levaquin. He has clinically improved and is currently afebrile and hemodynamically stable. His cultures have been  negative. He's being discharged on oral doxycycline.  4. Symptomatic anemia-patient has anemia of chronic disease secondary to malignancy, chemotherapy. Patient was transfused a total of 2 units of packed red blood cells on the hospital. Hemoglobin is improved posttransfusion and can be further followed as an outpatient.   DISCHARGE CONDITIONS:   Stable  CONSULTS OBTAINED:    DRUG ALLERGIES:  No Known Allergies  DISCHARGE MEDICATIONS:     Medication List    TAKE these medications   ibuprofen 200 MG tablet Commonly known as:  ADVIL,MOTRIN Take 200 mg by mouth every 6 (six) hours as needed.   levofloxacin 750 MG tablet Commonly known as:  LEVAQUIN Take 1 tablet (750 mg total) by mouth daily.   oxyCODONE 5 MG immediate release tablet Commonly known as:  Oxy IR/ROXICODONE Take 5 mg by mouth every 6 (six) hours as needed for moderate pain or severe pain.   PROVENTIL HFA 108 (90 Base) MCG/ACT inhaler Generic drug:  albuterol Inhale 90 mcg into the lungs every 6 (six) hours as needed.   trifluridine-tipiracil 15-6.14 MG tablet Commonly known as:  LONSURF Take 2 tabs by mouth twice a day Monday-Friday for 2 weeks, then 2 weeks off. Total dose '70mg'$ .   trifluridine-tipiracil 20-8.19 MG tablet Commonly known as:  LONSURF Take 2 tabs by mouth twice a day Monday-Friday for 2 weeks, then 2 weeks off. Total dose '70mg'$ .         DISCHARGE INSTRUCTIONS:   DIET:  Regular diet  DISCHARGE CONDITION:  Stable  ACTIVITY:  Activity as tolerated  OXYGEN:  Home Oxygen: No.   Oxygen  Delivery: room air  DISCHARGE LOCATION:  home   If you experience worsening of your admission symptoms, develop shortness of breath, life threatening emergency, suicidal or homicidal thoughts you must seek medical attention immediately by calling 911 or calling your MD immediately  if symptoms less severe.  You Must read complete instructions/literature along with all the possible adverse  reactions/side effects for all the Medicines you take and that have been prescribed to you. Take any new Medicines after you have completely understood and accpet all the possible adverse reactions/side effects.   Please note  You were cared for by a hospitalist during your hospital stay. If you have any questions about your discharge medications or the care you received while you were in the hospital after you are discharged, you can call the unit and asked to speak with the hospitalist on call if the hospitalist that took care of you is not available. Once you are discharged, your primary care physician will handle any further medical issues. Please note that NO REFILLS for any discharge medications will be authorized once you are discharged, as it is imperative that you return to your primary care physician (or establish a relationship with a primary care physician if you do not have one) for your aftercare needs so that they can reassess your need for medications and monitor your lab values.     Today   Shortness of breath much improved. Hemoglobin improved posttransfusion. No chest pain.  VITAL SIGNS:  Blood pressure 114/63, pulse 74, temperature 98.3 F (36.8 C), temperature source Oral, resp. rate 18, height '6\' 2"'$  (1.88 m), weight 72 kg (158 lb 12.8 oz), SpO2 97 %.  I/O:   Intake/Output Summary (Last 24 hours) at 11/25/15 1429 Last data filed at 11/25/15 1300  Gross per 24 hour  Intake           806.17 ml  Output             1300 ml  Net          -493.83 ml    PHYSICAL EXAMINATION:  GENERAL:  75 y.o.-year-old patient lying in the bed with no acute distress.  EYES: Pupils equal, round, reactive to light and accommodation. No scleral icterus. Extraocular muscles intact.  HEENT: Head atraumatic, normocephalic. Oropharynx and nasopharynx clear.  NECK:  Supple, no jugular venous distention. No thyroid enlargement, no tenderness.  LUNGS: Normal breath sounds bilaterally, no wheezing,  rales,rhonchi. No use of accessory muscles of respiration.  CARDIOVASCULAR: S1, S2 normal. No murmurs, rubs, or gallops.  ABDOMEN: Soft, non-tender, non-distended. Bowel sounds present. No organomegaly or mass.  EXTREMITIES: No pedal edema, cyanosis, or clubbing.  NEUROLOGIC: Cranial nerves II through XII are intact. No focal motor or sensory defecits b/l.  PSYCHIATRIC: The patient is alert and oriented x 3. Good affect.  SKIN: No obvious rash, lesion, or ulcer.   DATA REVIEW:   CBC  Recent Labs Lab 11/25/15 0445  WBC 5.3  HGB 8.0*  HCT 22.9*  PLT 30*    Chemistries   Recent Labs Lab 11/23/15 2151  NA 133*  K 3.5  CL 101  CO2 25  GLUCOSE 99  BUN 18  CREATININE 0.86  CALCIUM 8.6*    Cardiac Enzymes  Recent Labs Lab 11/23/15 2151  TROPONINI <0.03    Microbiology Results  Results for orders placed or performed during the hospital encounter of 11/23/15  Culture, blood (routine x 2)     Status: None (Preliminary result)  Collection Time: 11/23/15 11:42 PM  Result Value Ref Range Status   Specimen Description BLOOD RIGHT FOREARM  Final   Special Requests BOTTLES DRAWN AEROBIC AND ANAEROBIC 5CC  Final   Culture NO GROWTH 2 DAYS  Final   Report Status PENDING  Incomplete  Culture, blood (routine x 2)     Status: None (Preliminary result)   Collection Time: 11/23/15 11:42 PM  Result Value Ref Range Status   Specimen Description BLOOD RIGHT HAND  Final   Special Requests BOTTLES DRAWN AEROBIC AND ANAEROBIC 5CC  Final   Culture NO GROWTH 1 DAY  Final   Report Status PENDING  Incomplete  MRSA PCR Screening     Status: None   Collection Time: 11/24/15  5:43 PM  Result Value Ref Range Status   MRSA by PCR NEGATIVE NEGATIVE Final    Comment:        The GeneXpert MRSA Assay (FDA approved for NASAL specimens only), is one component of a comprehensive MRSA colonization surveillance program. It is not intended to diagnose MRSA infection nor to guide or monitor  treatment for MRSA infections.     RADIOLOGY:  Dg Chest 2 View  Result Date: 11/23/2015 CLINICAL DATA:  Shortness of breath. Symptoms for 4 days. History of multiple malignancies including lung, colon, prostate, brain and liver metastasis. EXAM: CHEST  2 VIEW COMPARISON:  Most recent chest imaging PET-CT 07/07/2015 FINDINGS: Tip of the left chest port in the SVC. Chronic treatment related changes in the right lung apex with associated right lung volume loss, unchanged. Left lung nodule on prior PET-CT not well seen radiographically. Minimal ill-defined patchy opacity at the left lung base, only appreciated on the AP view. Unchanged heart size and mediastinal contours. No pneumothorax or pleural fluid. No evidence of acute osseous abnormality. IMPRESSION: 1. Ill-defined patchy opacity at the left lung base, may be atelectasis or pneumonia in the appropriate clinical setting. Given malignancy history, neoplastic etiology is not entirely excluded but felt less likely. Recommend continued radiographic follow-up after course of treatment. 2. Chronic treatment related change in the right lung. Electronically Signed   By: Jeb Levering M.D.   On: 11/23/2015 22:41   Ct Angio Chest Pe W/cm &/or Wo Cm  Result Date: 11/24/2015 CLINICAL DATA:  Shortness of breath and hypoxia. 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 EXAM: CT ANGIOGRAPHY CHEST WITH CONTRAST TECHNIQUE: Multidetector CT imaging of the chest was performed using the standard protocol during bolus administration of intravenous contrast. Multiplanar CT image reconstructions and MIPs were obtained to evaluate the vascular anatomy. CONTRAST:  75 cc Isovue 370 IV COMPARISON:  Chest radiograph earlier this day. PET-CT 07/07/2015, chest CT 03/19/2015. FINDINGS: Cardiovascular: No filling defects within the pulmonary arteries to suggest pulmonary embolus. Right lung pulmonary arteries are attenuated secondary to post treatment related  change. Tortuous thoracic aorta with atherosclerosis, no evidence of dissection. Coronary artery calcifications are seen. Heart size is normal. Chest wall collaterals again noted. Left chest port in place. Mediastinum/Nodes: No mediastinal, hilar, or axillary adenopathy. No pericardial fluid. Minimal intraluminal debris within the esophagus which is nondilated. Lungs/Pleura: Stable appearance of posttreatment related change in the right upper lobe with dense upper lobe consolidation and right upper lobe bronchiectasis. Left upper lobe perifissural nodule measures 9 mm, unchanged. The left upper lobe nodule on prior chest CT is not visualized. An irregular 7 mm opacity in the left lower lobe series 7 image 86 was not seen on prior CT or  PET. There is otherwise no focal abnormality at the left lung base corresponding to radiographic abnormality. Upper Abdomen: Calcified hepatic lesions are again seen. Many of the hepatic metastasis on prior PET are not well visualized due to phase of contrast timing. No definite acute abnormality. Musculoskeletal: No destructive lytic or blastic osseous lesion is seen. Review of the MIP images confirms the above findings. IMPRESSION: 1. No pulmonary embolus. 2. Stable appearance of post treatment related change in the right upper lung. Stable left upper lobe pulmonary nodule measuring 9 mm. 3. New irregular nodular opacity in the subpleural left lower lobe measures 7 mm. Given malignancy history this is concerning for metastasis, however subpleural location is nonspecific. Electronically Signed   By: Jeb Levering M.D.   On: 11/24/2015 00:34      Management plans discussed with the patient, family and they are in agreement.  CODE STATUS:     Code Status Orders        Start     Ordered   11/24/15 0333  Full code  Continuous     11/24/15 0332    Code Status History    Date Active Date Inactive Code Status Order ID Comments User Context   This patient has a current  code status but no historical code status.      TOTAL TIME TAKING CARE OF THIS PATIENT: 40 minutes.    Henreitta Leber M.D on 11/25/2015 at 2:29 PM  Between 7am to 6pm - Pager - [phone]  After 6pm go to [email or web address] - Proofreader  [facility]  Office  [phone]  CC: Primary care physician; Lloyd Huger, MD

## 2015-11-26 LAB — TYPE AND SCREEN
ABO/RH(D): O POS
Antibody Screen: NEGATIVE
Unit division: 0
Unit division: 0
Unit division: 0

## 2015-11-28 LAB — CULTURE, BLOOD (ROUTINE X 2): Culture: NO GROWTH

## 2015-11-29 LAB — CULTURE, BLOOD (ROUTINE X 2): Culture: NO GROWTH

## 2015-12-01 DIAGNOSIS — D649 Anemia, unspecified: Secondary | ICD-10-CM | POA: Diagnosis not present

## 2015-12-01 DIAGNOSIS — C61 Malignant neoplasm of prostate: Secondary | ICD-10-CM | POA: Diagnosis not present

## 2015-12-01 DIAGNOSIS — C3490 Malignant neoplasm of unspecified part of unspecified bronchus or lung: Secondary | ICD-10-CM | POA: Diagnosis not present

## 2015-12-01 DIAGNOSIS — C229 Malignant neoplasm of liver, not specified as primary or secondary: Secondary | ICD-10-CM | POA: Diagnosis not present

## 2015-12-01 DIAGNOSIS — J440 Chronic obstructive pulmonary disease with acute lower respiratory infection: Secondary | ICD-10-CM | POA: Diagnosis not present

## 2015-12-01 DIAGNOSIS — J189 Pneumonia, unspecified organism: Secondary | ICD-10-CM | POA: Diagnosis not present

## 2015-12-01 DIAGNOSIS — Z923 Personal history of irradiation: Secondary | ICD-10-CM | POA: Diagnosis not present

## 2015-12-01 DIAGNOSIS — C189 Malignant neoplasm of colon, unspecified: Secondary | ICD-10-CM | POA: Diagnosis not present

## 2015-12-01 DIAGNOSIS — C719 Malignant neoplasm of brain, unspecified: Secondary | ICD-10-CM | POA: Diagnosis not present

## 2015-12-02 DIAGNOSIS — D649 Anemia, unspecified: Secondary | ICD-10-CM | POA: Diagnosis not present

## 2015-12-02 DIAGNOSIS — C189 Malignant neoplasm of colon, unspecified: Secondary | ICD-10-CM | POA: Diagnosis not present

## 2015-12-02 DIAGNOSIS — C719 Malignant neoplasm of brain, unspecified: Secondary | ICD-10-CM | POA: Diagnosis not present

## 2015-12-02 DIAGNOSIS — J440 Chronic obstructive pulmonary disease with acute lower respiratory infection: Secondary | ICD-10-CM | POA: Diagnosis not present

## 2015-12-02 DIAGNOSIS — J189 Pneumonia, unspecified organism: Secondary | ICD-10-CM | POA: Diagnosis not present

## 2015-12-02 DIAGNOSIS — C3490 Malignant neoplasm of unspecified part of unspecified bronchus or lung: Secondary | ICD-10-CM | POA: Diagnosis not present

## 2015-12-04 DIAGNOSIS — J189 Pneumonia, unspecified organism: Secondary | ICD-10-CM | POA: Diagnosis not present

## 2015-12-04 DIAGNOSIS — D649 Anemia, unspecified: Secondary | ICD-10-CM | POA: Diagnosis not present

## 2015-12-04 DIAGNOSIS — C719 Malignant neoplasm of brain, unspecified: Secondary | ICD-10-CM | POA: Diagnosis not present

## 2015-12-04 DIAGNOSIS — J440 Chronic obstructive pulmonary disease with acute lower respiratory infection: Secondary | ICD-10-CM | POA: Diagnosis not present

## 2015-12-04 DIAGNOSIS — C3490 Malignant neoplasm of unspecified part of unspecified bronchus or lung: Secondary | ICD-10-CM | POA: Diagnosis not present

## 2015-12-04 DIAGNOSIS — C189 Malignant neoplasm of colon, unspecified: Secondary | ICD-10-CM | POA: Diagnosis not present

## 2015-12-05 DIAGNOSIS — C3490 Malignant neoplasm of unspecified part of unspecified bronchus or lung: Secondary | ICD-10-CM | POA: Diagnosis not present

## 2015-12-05 DIAGNOSIS — C189 Malignant neoplasm of colon, unspecified: Secondary | ICD-10-CM | POA: Diagnosis not present

## 2015-12-05 DIAGNOSIS — J189 Pneumonia, unspecified organism: Secondary | ICD-10-CM | POA: Diagnosis not present

## 2015-12-05 DIAGNOSIS — C719 Malignant neoplasm of brain, unspecified: Secondary | ICD-10-CM | POA: Diagnosis not present

## 2015-12-05 DIAGNOSIS — D649 Anemia, unspecified: Secondary | ICD-10-CM | POA: Diagnosis not present

## 2015-12-05 DIAGNOSIS — J440 Chronic obstructive pulmonary disease with acute lower respiratory infection: Secondary | ICD-10-CM | POA: Diagnosis not present

## 2015-12-08 ENCOUNTER — Encounter
Admission: RE | Admit: 2015-12-08 | Discharge: 2015-12-08 | Disposition: A | Discharge status: Home / Self Care | Payer: Medicare Other | Source: Ambulatory Visit | Attending: Oncology | Admitting: Oncology

## 2015-12-08 DIAGNOSIS — C787 Secondary malignant neoplasm of liver and intrahepatic bile duct: Secondary | ICD-10-CM

## 2015-12-08 DIAGNOSIS — C187 Malignant neoplasm of sigmoid colon: Secondary | ICD-10-CM | POA: Diagnosis not present

## 2015-12-08 DIAGNOSIS — C189 Malignant neoplasm of colon, unspecified: Secondary | ICD-10-CM | POA: Diagnosis not present

## 2015-12-08 LAB — GLUCOSE, CAPILLARY: Glucose-Capillary: 58 mg/dL — ABNORMAL LOW (ref 65–99)

## 2015-12-08 MED ORDER — FLUDEOXYGLUCOSE F - 18 (FDG) INJECTION
12.1600 | Freq: Once | INTRAVENOUS | Status: AC
  Administered 2015-12-08: 12.16 via INTRAVENOUS

## 2015-12-09 DIAGNOSIS — C3490 Malignant neoplasm of unspecified part of unspecified bronchus or lung: Secondary | ICD-10-CM | POA: Diagnosis not present

## 2015-12-09 DIAGNOSIS — J440 Chronic obstructive pulmonary disease with acute lower respiratory infection: Secondary | ICD-10-CM | POA: Diagnosis not present

## 2015-12-09 DIAGNOSIS — D649 Anemia, unspecified: Secondary | ICD-10-CM | POA: Diagnosis not present

## 2015-12-09 DIAGNOSIS — C719 Malignant neoplasm of brain, unspecified: Secondary | ICD-10-CM | POA: Diagnosis not present

## 2015-12-09 DIAGNOSIS — J189 Pneumonia, unspecified organism: Secondary | ICD-10-CM | POA: Diagnosis not present

## 2015-12-09 DIAGNOSIS — C189 Malignant neoplasm of colon, unspecified: Secondary | ICD-10-CM | POA: Diagnosis not present

## 2015-12-10 DIAGNOSIS — C189 Malignant neoplasm of colon, unspecified: Secondary | ICD-10-CM | POA: Diagnosis not present

## 2015-12-10 DIAGNOSIS — C719 Malignant neoplasm of brain, unspecified: Secondary | ICD-10-CM | POA: Diagnosis not present

## 2015-12-10 DIAGNOSIS — D649 Anemia, unspecified: Secondary | ICD-10-CM | POA: Diagnosis not present

## 2015-12-10 DIAGNOSIS — C3490 Malignant neoplasm of unspecified part of unspecified bronchus or lung: Secondary | ICD-10-CM | POA: Diagnosis not present

## 2015-12-10 DIAGNOSIS — J189 Pneumonia, unspecified organism: Secondary | ICD-10-CM | POA: Diagnosis not present

## 2015-12-10 DIAGNOSIS — J440 Chronic obstructive pulmonary disease with acute lower respiratory infection: Secondary | ICD-10-CM | POA: Diagnosis not present

## 2015-12-10 NOTE — Progress Notes (Signed)
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  INTERVAL HISTORY: Patient returns to clinic today for further evaluation, discussion of his imaging results, and treatment planning. He is fully recovered from his recent hospital admission and is nearly back to his baseline. He has no neurologic complaints. He denies any recent fevers or illnesses. He has a good appetite and denies weight loss. He has no chest pain or shortness of breath. He denies any nausea, vomiting, constipation, or diarrhea. He has no urinary complaints. Patient offers no specific complaints today.  REVIEW OF SYSTEMS:   Review of Systems  Constitutional: Negative.  Negative for fever, malaise/fatigue and weight loss.  Respiratory: Negative.  Negative for cough and shortness of breath.   Cardiovascular: Negative.  Negative for chest pain.  Gastrointestinal: Negative.  Negative for abdominal pain.  Musculoskeletal: Negative.   Neurological: Negative.  Negative for weakness.  Psychiatric/Behavioral: Negative.     As per HPI. Otherwise, a complete review of systems is negative.  PAST MEDICAL HISTORY: Past Medical History:  Diagnosis Date  . Brain cancer ([place]) 01/2014   Chemotherapy and radiation treatment  . Colon cancer ([place]) 01/2014  . Kidney stones 1975  . Liver cancer ([place])   . Lung cancer [facility]) 2014   Radiation therapy  . Prostate cancer [facility]) 2005   treated by Dr Eliberto Ivory    PAST SURGICAL HISTORY: Past Surgical History:  Procedure Laterality Date  . COLON SURGERY  05-03-14   Resection of Sigmoid Colon and liver biopsy  . kidney stones  1975  . PORTACATH PLACEMENT  2014  . PROSTATE SURGERY  2005   partial removal due to cancer    FAMILY HISTORY: Family History  Problem Relation Age of Onset  . Lung cancer Father     . Alzheimer's disease Mother        ADVANCED DIRECTIVES:    HEALTH MAINTENANCE: Social History  Substance Use Topics  . Smoking status: Former Smoker    Years: 35.00  . Smokeless tobacco: Never Used  . Alcohol use No     Colonoscopy:  PAP:  Bone density:  Lipid panel:  No Known Allergies  Current Outpatient Prescriptions  Medication Sig Dispense Refill  . ibuprofen (ADVIL,MOTRIN) 200 MG tablet Take 200 mg by mouth every 6 (six) hours as needed.    Marland Kitchen PROVENTIL HFA 108 (90 BASE) MCG/ACT inhaler Inhale 90 mcg into the lungs every 6 (six) hours as needed.    Marland Kitchen trifluridine-tipiracil (LONSURF) 15-6.14 MG tablet Take 2 tabs by mouth twice a day Monday-Friday for 2 weeks, then 2 weeks off. Total dose 60m. (Patient not taking: Reported on 12/11/2015) 40 tablet 3  . trifluridine-tipiracil (LONSURF) 20-8.19 MG tablet Take 2 tabs by mouth twice a day Monday-Friday for 2 weeks, then 2 weeks off. Total dose 743m (Patient not taking: Reported on 12/11/2015) 40 tablet 3   No current facility-administered medications for this visit.    Facility-Administered Medications Ordered in Other Visits  Medication Dose Route Frequency Provider Last Rate Last Dose  . sodium chloride 0.9 % injection 10 mL  10 mL Intracatheter PRN JaForest GleasonMD   10 mL at 07/10/14 1428    OBJECTIVE: Vitals:   12/11/15 0952  BP: 123/83  Pulse: (!) 108  Resp: 18  Temp: (!) 96.8 F (36 C)     Body  mass index is 18.75 kg/m.    ECOG FS:0 - Asymptomatic  General: Well-developed, well-nourished, no acute distress. Eyes: Pink conjunctiva, anicteric sclera. Lungs: Clear to auscultation bilaterally. Heart: Regular rate and rhythm. No rubs, murmurs, or gallops. Abdomen: Soft, nontender, nondistended. No organomegaly noted, normoactive bowel sounds. Musculoskeletal: No edema, cyanosis, or clubbing. Neuro: Alert, answering all questions appropriately. Cranial nerves grossly intact. Skin: No rashes or petechiae  noted. Psych: Normal affect.    LAB RESULTS:  Lab Results  Component Value Date   NA 134 (L) 12/11/2015   K 4.0 12/11/2015   CL 98 (L) 12/11/2015   CO2 28 12/11/2015   GLUCOSE 107 (H) 12/11/2015   BUN 9 12/11/2015   CREATININE 0.81 12/11/2015   CALCIUM 9.1 12/11/2015   PROT 8.3 (H) 12/11/2015   ALBUMIN 3.4 (L) 12/11/2015   AST 42 (H) 12/11/2015   ALT 30 12/11/2015   ALKPHOS 205 (H) 12/11/2015   BILITOT 0.4 12/11/2015   GFRNONAA >60 12/11/2015   GFRAA >60 12/11/2015    Lab Results  Component Value Date   WBC 5.4 12/11/2015   NEUTROABS 2.0 12/11/2015   HGB 9.0 (L) 12/11/2015   HCT 26.7 (L) 12/11/2015   MCV 93.3 12/11/2015   PLT 142 (L) 12/11/2015   Lab Results  Component Value Date   CEA 387.2 (H) 12/11/2015     STUDIES: Dg Chest 2 View  Result Date: 11/23/2015 CLINICAL DATA:  Shortness of breath. Symptoms for 4 days. History of multiple malignancies including lung, colon, prostate, brain and liver metastasis. EXAM: CHEST  2 VIEW COMPARISON:  Most recent chest imaging PET-CT 07/07/2015 FINDINGS: Tip of the left chest port in the SVC. Chronic treatment related changes in the right lung apex with associated right lung volume loss, unchanged. Left lung nodule on prior PET-CT not well seen radiographically. Minimal ill-defined patchy opacity at the left lung base, only appreciated on the AP view. Unchanged heart size and mediastinal contours. No pneumothorax or pleural fluid. No evidence of acute osseous abnormality. IMPRESSION: 1. Ill-defined patchy opacity at the left lung base, may be atelectasis or pneumonia in the appropriate clinical setting. Given malignancy history, neoplastic etiology is not entirely excluded but felt less likely. Recommend continued radiographic follow-up after course of treatment. 2. Chronic treatment related change in the right lung. Electronically Signed   By: Jeb Levering M.D.   On: 11/23/2015 22:41   Ct Angio Chest Pe W/cm &/or Wo  Cm  Result Date: 11/24/2015 CLINICAL DATA:  Shortness of breath and hypoxia. 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 EXAM: CT ANGIOGRAPHY CHEST WITH CONTRAST TECHNIQUE: Multidetector CT imaging of the chest was performed using the standard protocol during bolus administration of intravenous contrast. Multiplanar CT image reconstructions and MIPs were obtained to evaluate the vascular anatomy. CONTRAST:  75 cc Isovue 370 IV COMPARISON:  Chest radiograph earlier this day. PET-CT 07/07/2015, chest CT 03/19/2015. FINDINGS: Cardiovascular: No filling defects within the pulmonary arteries to suggest pulmonary embolus. Right lung pulmonary arteries are attenuated secondary to post treatment related change. Tortuous thoracic aorta with atherosclerosis, no evidence of dissection. Coronary artery calcifications are seen. Heart size is normal. Chest wall collaterals again noted. Left chest port in place. Mediastinum/Nodes: No mediastinal, hilar, or axillary adenopathy. No pericardial fluid. Minimal intraluminal debris within the esophagus which is nondilated. Lungs/Pleura: Stable appearance of posttreatment related change in the right upper lobe with dense upper lobe consolidation and right upper lobe bronchiectasis. Left upper lobe perifissural nodule measures  9 mm, unchanged. The left upper lobe nodule on prior chest CT is not visualized. An irregular 7 mm opacity in the left lower lobe series 7 image 86 was not seen on prior CT or PET. There is otherwise no focal abnormality at the left lung base corresponding to radiographic abnormality. Upper Abdomen: Calcified hepatic lesions are again seen. Many of the hepatic metastasis on prior PET are not well visualized due to phase of contrast timing. No definite acute abnormality. Musculoskeletal: No destructive lytic or blastic osseous lesion is seen. Review of the MIP images confirms the above findings. IMPRESSION: 1. No pulmonary embolus. 2. Stable  appearance of post treatment related change in the right upper lung. Stable left upper lobe pulmonary nodule measuring 9 mm. 3. New irregular nodular opacity in the subpleural left lower lobe measures 7 mm. Given malignancy history this is concerning for metastasis, however subpleural location is nonspecific. Electronically Signed   By: Jeb Levering M.D.   On: 11/24/2015 00:34   Nm Pet Image Restag (ps) Skull Base To Thigh  Result Date: 12/08/2015 CLINICAL DATA:  Subsequent treatment strategy for carcinoma sigmoid colon. EXAM: NUCLEAR MEDICINE PET SKULL BASE TO THIGH TECHNIQUE: 12.2 mCi F-18 FDG was injected intravenously. Full-ring PET imaging was performed from the skull base to thigh after the radiotracer. CT data was obtained and used for attenuation correction and anatomic localization. FASTING BLOOD GLUCOSE:  Value: 58 mg/dl COMPARISON:  Chest CT 11/23/2015, PET-CT 5227 FINDINGS: NECK There is asymmetric hypermetabolic activity in the posterior RIGHT hypopharynx in the region of the palatini tonsil (image 36of the fused data). Sctivity is high with SUV max equal 7.0. There is an adjacent hypermetabolic level 2 lymph node on the RIGHT with SUV max equals 6.3. This is difficult define on noncontrast CT but measures approximately 10 mm short axis. CHEST No hypermetabolic mediastinal lymph nodes. Hypermetabolic pulmonary nodules. Perihilar consolidation on the RIGHT without metabolic activity. There is a 9 mm LEFT upper lobe pulmonary nodule (image 103, series 3) with out associated metabolic activity ABDOMEN/PELVIS There are multiple hypermetabolic liver metastasis. The lesions have increased in size and metabolic activity. RIGHT hepatic lobe lesion for example in the central RIGHT hepatic lobe measures approximately 7 cm with SUV max equal 15.7 increased from 4.6 cm with SUV max equal 8.0. Enlarged lesions in the central LEFT hepatic lobe measure approximately 5.3 cm with SUV max equal 14.4 increased  from 3.7 cm with SUV max equal 9.5. There is a new lesion in LEFT lateral hepatic lobe with intense metabolic (SUV max equal 9.7 close. No hypermetabolic lymph nodes the abdomen pelvis. No focal metabolic activity associated with the bowel shows colorectal carcinoma recurrence. SKELETON No focal hypermetabolic activity to suggest skeletal metastasis. IMPRESSION: 1. Progression of hepatic metastasis with enlarged lesions with increased metabolic activity. Additionally there are new lesions present. 2. No evidence of metabolically active pulmonary metastasis. 3. New asymmetric thickening within the RIGHT hypopharynx is indeterminate. Adjacent hypermetabolic RIGHT level 2 lymph node. Findings may represent pharyngitis. Clinical correlation and consider ENT direct visualization. Electronically Signed   By: Suzy Bouchard M.D.   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 He received carboplatinum and etoposide along with XRT completing in approximately January 2015. Patient had a recurrence in his liver in September 2015 and was reinitiated on treatment with carboplatinum and etoposide again. He was then diagnosed with  adenocarcinoma of the sigmoid colon with biopsy-proven metastasis to the liver in March 2016. K-ras wild-type. Patient initiated FOLFOX and Vectibix in April 2016. Patient was found to have a complete response, therefore was switched to Vectibix maintenance therapy in February 2017. Patient was then found to have progressive disease on a PET scan in May 2017 and was subsequently switched to [facility]. PET scan on December 08, 2015 revealed progressive disease and patient started FOLFIRI on December 15, 2015.   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  PLAN:    1.  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 PET scan results from December 08, 2015 revealed progressive disease in patient's liver. His CEA has also trended up significantly and is now 387. Discontinue Lonsurf. On previous review of patient's records, he did not receive any irinotecan therefore will proceed with treatment using  FOLFIRI + Avastin every 2 weeks. Return to clinic on December 15, 2015 to receive cycle 1. Plan to reimage after 6 cycles.    2. Neutropenia: Resolved. Secondary to treatment. 3. Anemia: Mild, secondary treatment. Monitor. 4. Thrombocytopenia: Resolved. Monitor. 5. 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 We will continue to monitor with routine imaging.  Approximately 30 minutes spent in discussion of which greater than 50% was consultation.   Patient expressed understanding and was in agreement with this plan. He also understands that He can call clinic at any time with any questions, concerns, or complaints.   Carcinoma of sigmoid colon [facility])   Staging form: Colon and Rectum, AJCC 7th Edition   - Clinical: Stage IVB (yT3, N1a, M1b) - Unsigned  Lloyd Huger, MD   12/14/2015 1:29 PM

## 2015-12-11 ENCOUNTER — Inpatient Hospital Stay: Payer: Medicare Other | Attending: Oncology

## 2015-12-11 ENCOUNTER — Inpatient Hospital Stay (HOSPITAL_BASED_OUTPATIENT_CLINIC_OR_DEPARTMENT_OTHER): Payer: Medicare Other | Admitting: Oncology

## 2015-12-11 VITALS — BP 123/83 | HR 108 | Temp 96.8°F | Resp 18 | Wt 146.1 lb

## 2015-12-11 DIAGNOSIS — C187 Malignant neoplasm of sigmoid colon: Secondary | ICD-10-CM | POA: Diagnosis not present

## 2015-12-11 DIAGNOSIS — Z923 Personal history of irradiation: Secondary | ICD-10-CM

## 2015-12-11 DIAGNOSIS — Z79899 Other long term (current) drug therapy: Secondary | ICD-10-CM | POA: Diagnosis not present

## 2015-12-11 DIAGNOSIS — Z85841 Personal history of malignant neoplasm of brain: Secondary | ICD-10-CM

## 2015-12-11 DIAGNOSIS — Z9221 Personal history of antineoplastic chemotherapy: Secondary | ICD-10-CM

## 2015-12-11 DIAGNOSIS — C787 Secondary malignant neoplasm of liver and intrahepatic bile duct: Secondary | ICD-10-CM | POA: Insufficient documentation

## 2015-12-11 DIAGNOSIS — Z5111 Encounter for antineoplastic chemotherapy: Secondary | ICD-10-CM | POA: Insufficient documentation

## 2015-12-11 DIAGNOSIS — Z801 Family history of malignant neoplasm of trachea, bronchus and lung: Secondary | ICD-10-CM

## 2015-12-11 DIAGNOSIS — Z8546 Personal history of malignant neoplasm of prostate: Secondary | ICD-10-CM

## 2015-12-11 DIAGNOSIS — T451X5S Adverse effect of antineoplastic and immunosuppressive drugs, sequela: Secondary | ICD-10-CM | POA: Diagnosis not present

## 2015-12-11 DIAGNOSIS — Z87442 Personal history of urinary calculi: Secondary | ICD-10-CM | POA: Insufficient documentation

## 2015-12-11 DIAGNOSIS — D6481 Anemia due to antineoplastic chemotherapy: Secondary | ICD-10-CM

## 2015-12-11 DIAGNOSIS — Z85118 Personal history of other malignant neoplasm of bronchus and lung: Secondary | ICD-10-CM

## 2015-12-11 LAB — CBC WITH DIFFERENTIAL/PLATELET
Basophils Absolute: 0.1 10*3/uL (ref 0–0.1)
Basophils Relative: 1 %
Eosinophils Absolute: 0.1 10*3/uL (ref 0–0.7)
Eosinophils Relative: 2 %
HEMATOCRIT: 26.7 % — AB (ref 40.0–52.0)
HEMOGLOBIN: 9 g/dL — AB (ref 13.0–18.0)
LYMPHS ABS: 2.6 10*3/uL (ref 1.0–3.6)
LYMPHS PCT: 47 %
MCH: 31.6 pg (ref 26.0–34.0)
MCHC: 33.8 g/dL (ref 32.0–36.0)
MCV: 93.3 fL (ref 80.0–100.0)
MONO ABS: 0.7 10*3/uL (ref 0.2–1.0)
MONOS PCT: 13 %
NEUTROS ABS: 2 10*3/uL (ref 1.4–6.5)
NEUTROS PCT: 37 %
Platelets: 142 10*3/uL — ABNORMAL LOW (ref 150–440)
RBC: 2.86 MIL/uL — ABNORMAL LOW (ref 4.40–5.90)
RDW: 22.7 % — AB (ref 11.5–14.5)
WBC: 5.4 10*3/uL (ref 3.8–10.6)

## 2015-12-11 LAB — COMPREHENSIVE METABOLIC PANEL
ALBUMIN: 3.4 g/dL — AB (ref 3.5–5.0)
ALK PHOS: 205 U/L — AB (ref 38–126)
ALT: 30 U/L (ref 17–63)
ANION GAP: 8 (ref 5–15)
AST: 42 U/L — AB (ref 15–41)
BILIRUBIN TOTAL: 0.4 mg/dL (ref 0.3–1.2)
BUN: 9 mg/dL (ref 6–20)
CALCIUM: 9.1 mg/dL (ref 8.9–10.3)
CO2: 28 mmol/L (ref 22–32)
Chloride: 98 mmol/L — ABNORMAL LOW (ref 101–111)
Creatinine, Ser: 0.81 mg/dL (ref 0.61–1.24)
GFR calc Af Amer: >60 mL/min (ref >60)
GLUCOSE: 107 mg/dL — AB (ref 65–99)
POTASSIUM: 4 mmol/L (ref 3.5–5.1)
Sodium: 134 mmol/L — ABNORMAL LOW (ref 135–145)
TOTAL PROTEIN: 8.3 g/dL — AB (ref 6.5–8.1)

## 2015-12-11 NOTE — Progress Notes (Signed)
States is having shortness of breath with exertion that has gradually improved. Per pt's wife has been sleeping more lately. At last visit, pt was instructed to hold lonsurf until next follow up for CT results. Pt states has not taken any doses of lonsurf since last visit on 11/05/2015.

## 2015-12-12 DIAGNOSIS — J189 Pneumonia, unspecified organism: Secondary | ICD-10-CM | POA: Diagnosis not present

## 2015-12-12 DIAGNOSIS — J440 Chronic obstructive pulmonary disease with acute lower respiratory infection: Secondary | ICD-10-CM | POA: Diagnosis not present

## 2015-12-12 DIAGNOSIS — C3490 Malignant neoplasm of unspecified part of unspecified bronchus or lung: Secondary | ICD-10-CM | POA: Diagnosis not present

## 2015-12-12 DIAGNOSIS — C189 Malignant neoplasm of colon, unspecified: Secondary | ICD-10-CM | POA: Diagnosis not present

## 2015-12-12 DIAGNOSIS — D649 Anemia, unspecified: Secondary | ICD-10-CM | POA: Diagnosis not present

## 2015-12-12 DIAGNOSIS — C719 Malignant neoplasm of brain, unspecified: Secondary | ICD-10-CM | POA: Diagnosis not present

## 2015-12-12 LAB — CEA: CEA: 387.2 ng/mL — AB (ref 0.0–4.7)

## 2015-12-14 MED ORDER — LOPERAMIDE HCL 2 MG PO TABS: 2.0000 mg | ORAL_TABLET | Freq: Three times a day (TID) | ORAL | 1 refills | Status: DC | PRN

## 2015-12-14 NOTE — Progress Notes (Signed)
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  INTERVAL HISTORY: Patient returns to clinic today for further evaluation and consideration of cycle 1 of FOLFIRI plus Avastin.  He currently feels well and is asymptomatic.  He has no neurologic complaints. He denies any recent fevers or illnesses. He has a good appetite and denies weight loss. He has no chest pain or shortness of breath. He denies any nausea, vomiting, constipation, or diarrhea. He has no urinary complaints. Patient offers no specific complaints today.  REVIEW OF SYSTEMS:   Review of Systems  Constitutional: Negative.  Negative for fever, malaise/fatigue and weight loss.  Respiratory: Negative.  Negative for cough and shortness of breath.   Cardiovascular: Negative.  Negative for chest pain.  Gastrointestinal: Negative.  Negative for abdominal pain.  Musculoskeletal: Negative.   Neurological: Negative.  Negative for weakness.  Psychiatric/Behavioral: Negative.     As per HPI. Otherwise, a complete review of systems is negative.  PAST MEDICAL HISTORY: Past Medical History:  Diagnosis Date  . Brain cancer ([place]) 01/2014   Chemotherapy and radiation treatment  . Colon cancer ([place]) 01/2014  . Kidney stones 1975  . Liver cancer ([place])   . Lung cancer [facility]) 2014   Radiation therapy  . Prostate cancer [facility]) 2005   treated by Dr Eliberto Ivory    PAST SURGICAL HISTORY: Past Surgical History:  Procedure Laterality Date  . COLON SURGERY  05-03-14   Resection of Sigmoid Colon and liver biopsy  . kidney stones  1975  . PORTACATH PLACEMENT  2014  . PROSTATE SURGERY  2005   partial removal due to cancer    FAMILY HISTORY: Family History  Problem Relation Age of Onset  . Lung cancer Father   . Alzheimer's disease Mother        ADVANCED  DIRECTIVES:    HEALTH MAINTENANCE: Social History  Substance Use Topics  . Smoking status: Former Smoker    Years: 35.00  . Smokeless tobacco: Never Used  . Alcohol use No     Colonoscopy:  PAP:  Bone density:  Lipid panel:  No Known Allergies  Current Outpatient Prescriptions  Medication Sig Dispense Refill  . ibuprofen (ADVIL,MOTRIN) 200 MG tablet Take 200 mg by mouth every 6 (six) hours as needed.    . loperamide (IMODIUM A-D) 2 MG tablet Take 1 tablet (2 mg total) by mouth 3 (three) times daily as needed. Take 2 at diarrhea onset , then 1 every 2hr until 12hrs with no BM. May take 2 every 4hrs at night. If diarrhea recurs repeat. 100 tablet 1  . PROVENTIL HFA 108 (90 BASE) MCG/ACT inhaler Inhale 90 mcg into the lungs every 6 (six) hours as needed.     No current facility-administered medications for this visit.    Facility-Administered Medications Ordered in Other Visits  Medication Dose Route Frequency Provider Last Rate Last Dose  . 0.9 %  sodium chloride infusion   Intravenous Once Lloyd Huger, MD      . bevacizumab (AVASTIN) 300 mg in sodium chloride 0.9 % 100 mL chemo infusion  300 mg Intravenous Once Lloyd Huger, MD      . fluorouracil (ADRUCIL) 4,450 mg in sodium chloride 0.9 % 61 mL chemo infusion  2,400 mg/m2 (Treatment Plan Recorded) Intravenous 1 day or 1 dose Lloyd Huger, MD      .  fluorouracil (ADRUCIL) chemo injection 750 mg  400 mg/m2 (Treatment Plan Recorded) Intravenous Once Lloyd Huger, MD      . irinotecan (CAMPTOSAR) 340 mg in dextrose 5 % 500 mL chemo infusion  180 mg/m2 (Treatment Plan Recorded) Intravenous Once Lloyd Huger, MD      . leucovorin 750 mg in dextrose 5 % 250 mL infusion  750 mg Intravenous Once Lloyd Huger, MD      . sodium chloride 0.9 % injection 10 mL  10 mL Intracatheter PRN [name], MD   10 mL at 07/10/14 1428  . sodium chloride flush (NS) 0.9 % injection 10 mL  10 mL Intracatheter PRN  Lloyd Huger, MD        OBJECTIVE: Vitals:   12/15/15 0929  BP: 124/74  Pulse: (!) 109  Resp: 18  Temp: 98.3 F (36.8 C)     Body mass index is 18.51 kg/m.    ECOG FS:0 - Asymptomatic  General: Well-developed, well-nourished, no acute distress. Eyes: Pink conjunctiva, anicteric sclera. Lungs: Clear to auscultation bilaterally. Heart: Regular rate and rhythm. No rubs, murmurs, or gallops. Abdomen: Soft, nontender, nondistended. No organomegaly noted, normoactive bowel sounds. Musculoskeletal: No edema, cyanosis, or clubbing. Neuro: Alert, answering all questions appropriately. Cranial nerves grossly intact. Skin: No rashes or petechiae noted. Psych: Normal affect.    LAB RESULTS:  Lab Results  Component Value Date   NA 136 12/15/2015   K 4.1 12/15/2015   CL 100 (L) 12/15/2015   CO2 25 12/15/2015   GLUCOSE 96 12/15/2015   BUN 13 12/15/2015   CREATININE 0.90 12/15/2015   CALCIUM 9.3 12/15/2015   PROT 9.0 (H) 12/15/2015   ALBUMIN 3.7 12/15/2015   AST 43 (H) 12/15/2015   ALT 34 12/15/2015   ALKPHOS 225 (H) 12/15/2015   BILITOT 0.6 12/15/2015   GFRNONAA >60 12/15/2015   GFRAA >60 12/15/2015    Lab Results  Component Value Date   WBC 7.1 12/15/2015   NEUTROABS 2.6 12/15/2015   HGB 9.9 (L) 12/15/2015   HCT 29.5 (L) 12/15/2015   MCV 94.1 12/15/2015   PLT 210 12/15/2015   Lab Results  Component Value Date   CEA 387.2 (H) 12/11/2015     STUDIES: Dg Chest 2 View  Result Date: 11/23/2015 CLINICAL DATA:  Shortness of breath. Symptoms for 4 days. History of multiple malignancies including lung, colon, prostate, brain and liver metastasis. EXAM: CHEST  2 VIEW COMPARISON:  Most recent chest imaging PET-CT 07/07/2015 FINDINGS: Tip of the left chest port in the SVC. Chronic treatment related changes in the right lung apex with associated right lung volume loss, unchanged. Left lung nodule on prior PET-CT not well seen radiographically. Minimal ill-defined patchy  opacity at the left lung base, only appreciated on the AP view. Unchanged heart size and mediastinal contours. No pneumothorax or pleural fluid. No evidence of acute osseous abnormality. IMPRESSION: 1. Ill-defined patchy opacity at the left lung base, may be atelectasis or pneumonia in the appropriate clinical setting. Given malignancy history, neoplastic etiology is not entirely excluded but felt less likely. Recommend continued radiographic follow-up after course of treatment. 2. Chronic treatment related change in the right lung. Electronically Signed   By: Jeb Levering M.D.   On: 11/23/2015 22:41   Ct Angio Chest Pe W/cm &/or Wo Cm  Result Date: 11/24/2015 CLINICAL DATA:  Shortness of breath and hypoxia. 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 EXAM: CT ANGIOGRAPHY CHEST WITH CONTRAST  TECHNIQUE: Multidetector CT imaging of the chest was performed using the standard protocol during bolus administration of intravenous contrast. Multiplanar CT image reconstructions and MIPs were obtained to evaluate the vascular anatomy. CONTRAST:  75 cc Isovue 370 IV COMPARISON:  Chest radiograph earlier this day. PET-CT 07/07/2015, chest CT 03/19/2015. FINDINGS: Cardiovascular: No filling defects within the pulmonary arteries to suggest pulmonary embolus. Right lung pulmonary arteries are attenuated secondary to post treatment related change. Tortuous thoracic aorta with atherosclerosis, no evidence of dissection. Coronary artery calcifications are seen. Heart size is normal. Chest wall collaterals again noted. Left chest port in place. Mediastinum/Nodes: No mediastinal, hilar, or axillary adenopathy. No pericardial fluid. Minimal intraluminal debris within the esophagus which is nondilated. Lungs/Pleura: Stable appearance of posttreatment related change in the right upper lobe with dense upper lobe consolidation and right upper lobe bronchiectasis. Left upper lobe perifissural nodule measures 9 mm,  unchanged. The left upper lobe nodule on prior chest CT is not visualized. An irregular 7 mm opacity in the left lower lobe series 7 image 86 was not seen on prior CT or PET. There is otherwise no focal abnormality at the left lung base corresponding to radiographic abnormality. Upper Abdomen: Calcified hepatic lesions are again seen. Many of the hepatic metastasis on prior PET are not well visualized due to phase of contrast timing. No definite acute abnormality. Musculoskeletal: No destructive lytic or blastic osseous lesion is seen. Review of the MIP images confirms the above findings. IMPRESSION: 1. No pulmonary embolus. 2. Stable appearance of post treatment related change in the right upper lung. Stable left upper lobe pulmonary nodule measuring 9 mm. 3. New irregular nodular opacity in the subpleural left lower lobe measures 7 mm. Given malignancy history this is concerning for metastasis, however subpleural location is nonspecific. Electronically Signed   By: Jeb Levering M.D.   On: 11/24/2015 00:34   Nm Pet Image Restag (ps) Skull Base To Thigh  Result Date: 12/08/2015 CLINICAL DATA:  Subsequent treatment strategy for carcinoma sigmoid colon. EXAM: NUCLEAR MEDICINE PET SKULL BASE TO THIGH TECHNIQUE: 12.2 mCi F-18 FDG was injected intravenously. Full-ring PET imaging was performed from the skull base to thigh after the radiotracer. CT data was obtained and used for attenuation correction and anatomic localization. FASTING BLOOD GLUCOSE:  Value: 58 mg/dl COMPARISON:  Chest CT 11/23/2015, PET-CT 5227 FINDINGS: NECK There is asymmetric hypermetabolic activity in the posterior RIGHT hypopharynx in the region of the palatini tonsil (image 36of the fused data). Sctivity is high with SUV max equal 7.0. There is an adjacent hypermetabolic level 2 lymph node on the RIGHT with SUV max equals 6.3. This is difficult define on noncontrast CT but measures approximately 10 mm short axis. CHEST No hypermetabolic  mediastinal lymph nodes. Hypermetabolic pulmonary nodules. Perihilar consolidation on the RIGHT without metabolic activity. There is a 9 mm LEFT upper lobe pulmonary nodule (image 103, series 3) with out associated metabolic activity ABDOMEN/PELVIS There are multiple hypermetabolic liver metastasis. The lesions have increased in size and metabolic activity. RIGHT hepatic lobe lesion for example in the central RIGHT hepatic lobe measures approximately 7 cm with SUV max equal 15.7 increased from 4.6 cm with SUV max equal 8.0. Enlarged lesions in the central LEFT hepatic lobe measure approximately 5.3 cm with SUV max equal 14.4 increased from 3.7 cm with SUV max equal 9.5. There is a new lesion in LEFT lateral hepatic lobe with intense metabolic (SUV max equal 9.7 close. No hypermetabolic lymph nodes the abdomen pelvis. No  focal metabolic activity associated with the bowel shows colorectal carcinoma recurrence. SKELETON No focal hypermetabolic activity to suggest skeletal metastasis. IMPRESSION: 1. Progression of hepatic metastasis with enlarged lesions with increased metabolic activity. Additionally there are new lesions present. 2. No evidence of metabolically active pulmonary metastasis. 3. New asymmetric thickening within the RIGHT hypopharynx is indeterminate. Adjacent hypermetabolic RIGHT level 2 lymph node. Findings may represent pharyngitis. Clinical correlation and consider ENT direct visualization. Electronically Signed   By: Suzy Bouchard M.D.   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 He received carboplatinum and etoposide along with XRT completing in approximately January 2015. Patient had a recurrence in his liver in September 2015 and was reinitiated on treatment with carboplatinum and etoposide again. He was then diagnosed with adenocarcinoma of the sigmoid colon with biopsy-proven  metastasis to the liver in March 2016. K-ras wild-type. Patient initiated FOLFOX and Vectibix in April 2016. Patient was found to have a complete response, therefore was switched to Vectibix maintenance therapy in February 2017. Patient was then found to have progressive disease on a PET scan in May 2017 and was subsequently switched to [facility]. PET scan on December 08, 2015 revealed progressive disease and patient started FOLFIRI on December 15, 2015.   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  PLAN:    1.  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 PET scan results from December 08, 2015 revealed progressive disease in patient's liver. His CEA has also trended up significantly and is now 387. Discontinue Lonsurf. On previous review of patient's records, he did not receive any irinotecan. Proceed with cycle 1 of FOLFIRI + Avastin every 2 weeks. Return to clinic in 2 days for pump removal, in 1 week for laboratory work, and then in 2 weeks for consideration of cycle 2.  Plan to reimage after 6 cycles.    2. Neutropenia: Resolved. Secondary to treatment. 3. Anemia: Mild, secondary treatment. Monitor. 4. Thrombocytopenia: Resolved. Monitor. 5. 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 We will continue to monitor with routine imaging.  Approximately 30 minutes spent in discussion of which greater than 50% was consultation.   Patient expressed understanding and was in agreement with this plan. He also understands that He can call clinic at any time with any questions, concerns, or complaints.   Carcinoma of sigmoid colon [facility])   Staging form: Colon and Rectum, AJCC 7th Edition   - Clinical: Stage IVB (yT3, N1a, M1b) - Unsigned  Lloyd Huger, MD   12/15/2015 10:40 AM

## 2015-12-14 NOTE — Progress Notes (Signed)
START OFF PATHWAY REGIMEN - Colorectal  Off Pathway: FOLFIRI + Bevacizumab (q14d)  [id number]:FOLFIRI + Bevacizumab (q14d):   A cycle is every 14 days:     Irinotecan (Camptosar(R)) 180 mg/m2 in 500 mL D5W IV over 90 minutes day 1 Dose Mod: None     Leucovorin 400 mg/m2 in 250 mL D5W IV over 2 hours day 1 followed immediately by Dose Mod: None     5-Fluorouracil 400 mg/m2 IV bolus over 2-4 minutes day 1 Dose Mod: None     5-Fluorouracil 2,400 mg/m2 in _____mL NS IV as a 46 hour infusion day 1 Dose Mod: None     Bevacizumab (Avastin(R)) 5 mg/kg in 123m NS IV every 2 weeks.  Administer first dose over 90 minutes if tolerated second dose over 60 minutes and if tolerated all subsequent doses over 30 minutes Dose Mod: None Additional Orders: Loperamide 4 mg PO first dose then 2 mg PO with every loose bowel movement up to a total of 16 mg/day (optional)  Schedule next chemotherapy  **Always confirm dose/schedule in your pharmacy ordering system**    Patient Characteristics: Metastatic Colorectal, Third Line, KRAS Mutation Positive/Unknown, BRAF Wild-Type/Unknown, MSI-H / dMMR AJCC T Stage: X AJCC N Stage: X AJCC Stage Grouping: IVB AJCC M Stage: X Current evidence of distant metastases? Yes BRAF Mutation Status: Wild Type (no mutation) KRAS/NRAS Mutation Status: Mutation Positive Line of therapy: Third Line Would you be surprised if this patient died  in the next year? I would NOT be surprised if this patient died in the next year Microsatellite/Mismatch Repair Status: MSI-H/dMMR  Intent of Therapy: Non-Curative / Palliative Intent, Discussed with Patient

## 2015-12-15 ENCOUNTER — Inpatient Hospital Stay (HOSPITAL_BASED_OUTPATIENT_CLINIC_OR_DEPARTMENT_OTHER): Payer: Medicare Other | Admitting: Oncology

## 2015-12-15 ENCOUNTER — Inpatient Hospital Stay: Payer: Medicare Other

## 2015-12-15 ENCOUNTER — Other Ambulatory Visit: Payer: Self-pay | Admitting: *Deleted

## 2015-12-15 VITALS — BP 124/74 | HR 109 | Temp 98.3°F | Resp 18 | Wt 144.2 lb

## 2015-12-15 VITALS — BP 120/80 | HR 102 | Temp 97.9°F | Resp 18

## 2015-12-15 DIAGNOSIS — Z85118 Personal history of other malignant neoplasm of bronchus and lung: Secondary | ICD-10-CM | POA: Diagnosis not present

## 2015-12-15 DIAGNOSIS — Z923 Personal history of irradiation: Secondary | ICD-10-CM

## 2015-12-15 DIAGNOSIS — C187 Malignant neoplasm of sigmoid colon: Secondary | ICD-10-CM

## 2015-12-15 DIAGNOSIS — T451X5S Adverse effect of antineoplastic and immunosuppressive drugs, sequela: Secondary | ICD-10-CM | POA: Diagnosis not present

## 2015-12-15 DIAGNOSIS — C801 Malignant (primary) neoplasm, unspecified: Secondary | ICD-10-CM

## 2015-12-15 DIAGNOSIS — C787 Secondary malignant neoplasm of liver and intrahepatic bile duct: Secondary | ICD-10-CM | POA: Diagnosis not present

## 2015-12-15 DIAGNOSIS — Z79899 Other long term (current) drug therapy: Secondary | ICD-10-CM

## 2015-12-15 DIAGNOSIS — Z9221 Personal history of antineoplastic chemotherapy: Secondary | ICD-10-CM

## 2015-12-15 DIAGNOSIS — Z5111 Encounter for antineoplastic chemotherapy: Secondary | ICD-10-CM | POA: Diagnosis not present

## 2015-12-15 DIAGNOSIS — Z801 Family history of malignant neoplasm of trachea, bronchus and lung: Secondary | ICD-10-CM

## 2015-12-15 DIAGNOSIS — Z85841 Personal history of malignant neoplasm of brain: Secondary | ICD-10-CM

## 2015-12-15 DIAGNOSIS — D6481 Anemia due to antineoplastic chemotherapy: Secondary | ICD-10-CM | POA: Diagnosis not present

## 2015-12-15 DIAGNOSIS — Z8546 Personal history of malignant neoplasm of prostate: Secondary | ICD-10-CM

## 2015-12-15 DIAGNOSIS — Z87442 Personal history of urinary calculi: Secondary | ICD-10-CM

## 2015-12-15 LAB — COMPREHENSIVE METABOLIC PANEL
ALT: 34 U/L (ref 17–63)
AST: 43 U/L — AB (ref 15–41)
Albumin: 3.7 g/dL (ref 3.5–5.0)
Alkaline Phosphatase: 225 U/L — ABNORMAL HIGH (ref 38–126)
Anion gap: 11 (ref 5–15)
BILIRUBIN TOTAL: 0.6 mg/dL (ref 0.3–1.2)
BUN: 13 mg/dL (ref 6–20)
CHLORIDE: 100 mmol/L — AB (ref 101–111)
CO2: 25 mmol/L (ref 22–32)
Calcium: 9.3 mg/dL (ref 8.9–10.3)
Creatinine, Ser: 0.9 mg/dL (ref 0.61–1.24)
GFR calc Af Amer: >60 mL/min (ref >60)
GFR calc non Af Amer: >60 mL/min (ref >60)
GLUCOSE: 96 mg/dL (ref 65–99)
POTASSIUM: 4.1 mmol/L (ref 3.5–5.1)
Sodium: 136 mmol/L (ref 135–145)
TOTAL PROTEIN: 9 g/dL — AB (ref 6.5–8.1)

## 2015-12-15 LAB — CBC WITH DIFFERENTIAL/PLATELET
Basophils Absolute: 0 10*3/uL (ref 0–0.1)
Basophils Relative: 1 %
Eosinophils Absolute: 0.2 10*3/uL (ref 0–0.7)
Eosinophils Relative: 3 %
HEMATOCRIT: 29.5 % — AB (ref 40.0–52.0)
Hemoglobin: 9.9 g/dL — ABNORMAL LOW (ref 13.0–18.0)
Lymphocytes Relative: 46 %
Lymphs Abs: 3.3 10*3/uL (ref 1.0–3.6)
MCH: 31.4 pg (ref 26.0–34.0)
MCHC: 33.4 g/dL (ref 32.0–36.0)
MCV: 94.1 fL (ref 80.0–100.0)
MONO ABS: 0.9 10*3/uL (ref 0.2–1.0)
Monocytes Relative: 13 %
NEUTROS ABS: 2.6 10*3/uL (ref 1.4–6.5)
NEUTROS PCT: 37 %
Platelets: 210 10*3/uL (ref 150–440)
RBC: 3.13 MIL/uL — ABNORMAL LOW (ref 4.40–5.90)
RDW: 22.4 % — AB (ref 11.5–14.5)
WBC: 7.1 10*3/uL (ref 3.8–10.6)

## 2015-12-15 LAB — URINALYSIS COMPLETE WITH MICROSCOPIC (ARMC ONLY)
Bacteria, UA: NONE SEEN
Bilirubin Urine: NEGATIVE
Glucose, UA: NEGATIVE mg/dL
HGB URINE DIPSTICK: NEGATIVE
Ketones, ur: NEGATIVE mg/dL
Leukocytes, UA: NEGATIVE
Nitrite: NEGATIVE
PH: 6 (ref 5.0–8.0)
PROTEIN: NEGATIVE mg/dL
Specific Gravity, Urine: 1.017 (ref 1.005–1.030)

## 2015-12-15 MED ORDER — SODIUM CHLORIDE 0.9 % IV SOLN: 10.0000 mg | Freq: Once | INTRAVENOUS | Status: DC

## 2015-12-15 MED ORDER — SODIUM CHLORIDE 0.9% FLUSH
10.0000 mL | INTRAVENOUS | Status: DC | PRN
  Administered 2015-12-15: 10 mL
  Filled 2015-12-15: qty 10

## 2015-12-15 MED ORDER — FLUOROURACIL CHEMO INJECTION 2.5 GM/50ML
400.0000 mg/m2 | Freq: Once | INTRAVENOUS | Status: AC
  Administered 2015-12-15: 750 mg via INTRAVENOUS
  Filled 2015-12-15: qty 15

## 2015-12-15 MED ORDER — DEXTROSE 5 % IV SOLN
750.0000 mg | Freq: Once | INTRAVENOUS | Status: AC
  Administered 2015-12-15: 750 mg via INTRAVENOUS
  Filled 2015-12-15: qty 17.5

## 2015-12-15 MED ORDER — DEXAMETHASONE SODIUM PHOSPHATE 10 MG/ML IJ SOLN
10.0000 mg | Freq: Once | INTRAMUSCULAR | Status: AC
  Administered 2015-12-15: 10 mg via INTRAVENOUS
  Filled 2015-12-15: qty 1

## 2015-12-15 MED ORDER — SODIUM CHLORIDE 0.9 % IV SOLN
Freq: Once | INTRAVENOUS | Status: AC
Start: 2015-12-15 — End: 2015-12-15
  Administered 2015-12-15: 10:00:00 via INTRAVENOUS
  Filled 2015-12-15: qty 1000

## 2015-12-15 MED ORDER — SODIUM CHLORIDE 0.9 % IV SOLN
300.0000 mg | Freq: Once | INTRAVENOUS | Status: AC
  Administered 2015-12-15: 300 mg via INTRAVENOUS
  Filled 2015-12-15: qty 12

## 2015-12-15 MED ORDER — FLUOROURACIL CHEMO INJECTION 5 GM/100ML
2400.0000 mg/m2 | INTRAVENOUS | Status: DC
  Administered 2015-12-15: 4450 mg via INTRAVENOUS
  Filled 2015-12-15: qty 89

## 2015-12-15 MED ORDER — IRINOTECAN HCL CHEMO INJECTION 100 MG/5ML
180.0000 mg/m2 | Freq: Once | INTRAVENOUS | Status: AC
  Administered 2015-12-15: 340 mg via INTRAVENOUS
  Filled 2015-12-15: qty 17

## 2015-12-15 MED ORDER — ATROPINE SULFATE 1 MG/ML IJ SOLN
0.5000 mg | Freq: Once | INTRAMUSCULAR | Status: AC | PRN
  Administered 2015-12-15: 0.5 mg via INTRAVENOUS
  Filled 2015-12-15: qty 1

## 2015-12-15 MED ORDER — PALONOSETRON HCL INJECTION 0.25 MG/5ML
0.2500 mg | Freq: Once | INTRAVENOUS | Status: AC
  Administered 2015-12-15: 0.25 mg via INTRAVENOUS
  Filled 2015-12-15: qty 5

## 2015-12-15 NOTE — Progress Notes (Signed)
States is feeling well. Has slight pain in right abd when coughing.

## 2015-12-15 NOTE — Progress Notes (Signed)
No Blood return from port. Flushes well. Tolerated fluid challenge. MD aware. Instructed to proceed with chemotherapy.

## 2015-12-16 LAB — CEA: CEA: 445.6 ng/mL — AB (ref 0.0–4.7)

## 2015-12-17 ENCOUNTER — Inpatient Hospital Stay: Payer: Medicare Other | Attending: Oncology

## 2015-12-17 DIAGNOSIS — Z923 Personal history of irradiation: Secondary | ICD-10-CM | POA: Insufficient documentation

## 2015-12-17 DIAGNOSIS — J440 Chronic obstructive pulmonary disease with acute lower respiratory infection: Secondary | ICD-10-CM | POA: Diagnosis not present

## 2015-12-17 DIAGNOSIS — Z85118 Personal history of other malignant neoplasm of bronchus and lung: Secondary | ICD-10-CM | POA: Insufficient documentation

## 2015-12-17 DIAGNOSIS — C787 Secondary malignant neoplasm of liver and intrahepatic bile duct: Secondary | ICD-10-CM | POA: Diagnosis not present

## 2015-12-17 DIAGNOSIS — C801 Malignant (primary) neoplasm, unspecified: Secondary | ICD-10-CM

## 2015-12-17 DIAGNOSIS — C189 Malignant neoplasm of colon, unspecified: Secondary | ICD-10-CM | POA: Diagnosis not present

## 2015-12-17 DIAGNOSIS — Z87891 Personal history of nicotine dependence: Secondary | ICD-10-CM | POA: Insufficient documentation

## 2015-12-17 DIAGNOSIS — D6481 Anemia due to antineoplastic chemotherapy: Secondary | ICD-10-CM | POA: Insufficient documentation

## 2015-12-17 DIAGNOSIS — Z85841 Personal history of malignant neoplasm of brain: Secondary | ICD-10-CM | POA: Insufficient documentation

## 2015-12-17 DIAGNOSIS — R918 Other nonspecific abnormal finding of lung field: Secondary | ICD-10-CM | POA: Diagnosis not present

## 2015-12-17 DIAGNOSIS — D696 Thrombocytopenia, unspecified: Secondary | ICD-10-CM | POA: Insufficient documentation

## 2015-12-17 DIAGNOSIS — Z9049 Acquired absence of other specified parts of digestive tract: Secondary | ICD-10-CM | POA: Insufficient documentation

## 2015-12-17 DIAGNOSIS — C187 Malignant neoplasm of sigmoid colon: Secondary | ICD-10-CM | POA: Diagnosis not present

## 2015-12-17 DIAGNOSIS — Z87442 Personal history of urinary calculi: Secondary | ICD-10-CM | POA: Diagnosis not present

## 2015-12-17 DIAGNOSIS — Z801 Family history of malignant neoplasm of trachea, bronchus and lung: Secondary | ICD-10-CM | POA: Insufficient documentation

## 2015-12-17 DIAGNOSIS — Z5111 Encounter for antineoplastic chemotherapy: Secondary | ICD-10-CM | POA: Diagnosis not present

## 2015-12-17 DIAGNOSIS — D649 Anemia, unspecified: Secondary | ICD-10-CM | POA: Diagnosis not present

## 2015-12-17 DIAGNOSIS — C3490 Malignant neoplasm of unspecified part of unspecified bronchus or lung: Secondary | ICD-10-CM | POA: Diagnosis not present

## 2015-12-17 DIAGNOSIS — J189 Pneumonia, unspecified organism: Secondary | ICD-10-CM | POA: Diagnosis not present

## 2015-12-17 DIAGNOSIS — Z5112 Encounter for antineoplastic immunotherapy: Secondary | ICD-10-CM | POA: Diagnosis not present

## 2015-12-17 DIAGNOSIS — C719 Malignant neoplasm of brain, unspecified: Secondary | ICD-10-CM | POA: Diagnosis not present

## 2015-12-17 DIAGNOSIS — Z8546 Personal history of malignant neoplasm of prostate: Secondary | ICD-10-CM | POA: Insufficient documentation

## 2015-12-17 MED ORDER — HEPARIN SOD (PORK) LOCK FLUSH 100 UNIT/ML IV SOLN
500.0000 [IU] | Freq: Once | INTRAVENOUS | Status: AC
  Administered 2015-12-17: 500 [IU] via INTRAVENOUS

## 2015-12-17 MED ORDER — HEPARIN SOD (PORK) LOCK FLUSH 100 UNIT/ML IV SOLN
INTRAVENOUS | Status: AC
  Filled 2015-12-17: qty 5

## 2015-12-17 MED ORDER — SODIUM CHLORIDE 0.9 % IJ SOLN
10.0000 mL | Freq: Once | INTRAMUSCULAR | Status: AC
  Administered 2015-12-17: 10 mL via INTRAVENOUS
  Filled 2015-12-17: qty 10

## 2015-12-18 ENCOUNTER — Ambulatory Visit: Payer: Medicare Other

## 2015-12-18 ENCOUNTER — Other Ambulatory Visit: Payer: Medicare Other

## 2015-12-18 ENCOUNTER — Ambulatory Visit: Payer: Medicare Other | Admitting: Oncology

## 2015-12-18 DIAGNOSIS — C189 Malignant neoplasm of colon, unspecified: Secondary | ICD-10-CM | POA: Diagnosis not present

## 2015-12-18 DIAGNOSIS — J189 Pneumonia, unspecified organism: Secondary | ICD-10-CM | POA: Diagnosis not present

## 2015-12-18 DIAGNOSIS — C719 Malignant neoplasm of brain, unspecified: Secondary | ICD-10-CM | POA: Diagnosis not present

## 2015-12-18 DIAGNOSIS — J440 Chronic obstructive pulmonary disease with acute lower respiratory infection: Secondary | ICD-10-CM | POA: Diagnosis not present

## 2015-12-18 DIAGNOSIS — D649 Anemia, unspecified: Secondary | ICD-10-CM | POA: Diagnosis not present

## 2015-12-18 DIAGNOSIS — C3490 Malignant neoplasm of unspecified part of unspecified bronchus or lung: Secondary | ICD-10-CM | POA: Diagnosis not present

## 2015-12-19 DIAGNOSIS — C3490 Malignant neoplasm of unspecified part of unspecified bronchus or lung: Secondary | ICD-10-CM | POA: Diagnosis not present

## 2015-12-19 DIAGNOSIS — D649 Anemia, unspecified: Secondary | ICD-10-CM | POA: Diagnosis not present

## 2015-12-19 DIAGNOSIS — J189 Pneumonia, unspecified organism: Secondary | ICD-10-CM | POA: Diagnosis not present

## 2015-12-19 DIAGNOSIS — C719 Malignant neoplasm of brain, unspecified: Secondary | ICD-10-CM | POA: Diagnosis not present

## 2015-12-19 DIAGNOSIS — C189 Malignant neoplasm of colon, unspecified: Secondary | ICD-10-CM | POA: Diagnosis not present

## 2015-12-19 DIAGNOSIS — J440 Chronic obstructive pulmonary disease with acute lower respiratory infection: Secondary | ICD-10-CM | POA: Diagnosis not present

## 2015-12-22 ENCOUNTER — Inpatient Hospital Stay: Payer: Medicare Other

## 2015-12-22 DIAGNOSIS — D6481 Anemia due to antineoplastic chemotherapy: Secondary | ICD-10-CM | POA: Diagnosis not present

## 2015-12-22 DIAGNOSIS — Z5112 Encounter for antineoplastic immunotherapy: Secondary | ICD-10-CM | POA: Diagnosis not present

## 2015-12-22 DIAGNOSIS — D696 Thrombocytopenia, unspecified: Secondary | ICD-10-CM | POA: Diagnosis not present

## 2015-12-22 DIAGNOSIS — C187 Malignant neoplasm of sigmoid colon: Secondary | ICD-10-CM

## 2015-12-22 DIAGNOSIS — C787 Secondary malignant neoplasm of liver and intrahepatic bile duct: Secondary | ICD-10-CM | POA: Diagnosis not present

## 2015-12-22 DIAGNOSIS — Z5111 Encounter for antineoplastic chemotherapy: Secondary | ICD-10-CM | POA: Diagnosis not present

## 2015-12-22 LAB — CBC WITH DIFFERENTIAL/PLATELET
BASOS ABS: 0 10*3/uL (ref 0–0.1)
BASOS PCT: 0 %
EOS ABS: 0.1 10*3/uL (ref 0–0.7)
EOS PCT: 3 %
HCT: 28.3 % — ABNORMAL LOW (ref 40.0–52.0)
Hemoglobin: 9.7 g/dL — ABNORMAL LOW (ref 13.0–18.0)
LYMPHS PCT: 41 %
Lymphs Abs: 1.4 10*3/uL (ref 1.0–3.6)
MCH: 32 pg (ref 26.0–34.0)
MCHC: 34.2 g/dL (ref 32.0–36.0)
MCV: 93.7 fL (ref 80.0–100.0)
MONO ABS: 0.3 10*3/uL (ref 0.2–1.0)
Monocytes Relative: 8 %
Neutro Abs: 1.5 10*3/uL (ref 1.4–6.5)
Neutrophils Relative %: 48 %
PLATELETS: 147 10*3/uL — AB (ref 150–440)
RBC: 3.01 MIL/uL — AB (ref 4.40–5.90)
RDW: 20.6 % — AB (ref 11.5–14.5)
WBC: 3.3 10*3/uL — AB (ref 3.8–10.6)

## 2015-12-22 LAB — COMPREHENSIVE METABOLIC PANEL
ALBUMIN: 3.5 g/dL (ref 3.5–5.0)
ALT: 20 U/L (ref 17–63)
AST: 25 U/L (ref 15–41)
Alkaline Phosphatase: 163 U/L — ABNORMAL HIGH (ref 38–126)
Anion gap: 7 (ref 5–15)
BUN: 13 mg/dL (ref 6–20)
CHLORIDE: 104 mmol/L (ref 101–111)
CO2: 26 mmol/L (ref 22–32)
CREATININE: 0.82 mg/dL (ref 0.61–1.24)
Calcium: 9.4 mg/dL (ref 8.9–10.3)
GFR calc Af Amer: >60 mL/min (ref >60)
Glucose, Bld: 105 mg/dL — ABNORMAL HIGH (ref 65–99)
POTASSIUM: 4.1 mmol/L (ref 3.5–5.1)
SODIUM: 137 mmol/L (ref 135–145)
Total Bilirubin: 0.3 mg/dL (ref 0.3–1.2)
Total Protein: 8.3 g/dL — ABNORMAL HIGH (ref 6.5–8.1)

## 2015-12-23 DIAGNOSIS — C189 Malignant neoplasm of colon, unspecified: Secondary | ICD-10-CM | POA: Diagnosis not present

## 2015-12-23 DIAGNOSIS — J440 Chronic obstructive pulmonary disease with acute lower respiratory infection: Secondary | ICD-10-CM | POA: Diagnosis not present

## 2015-12-23 DIAGNOSIS — J189 Pneumonia, unspecified organism: Secondary | ICD-10-CM | POA: Diagnosis not present

## 2015-12-23 DIAGNOSIS — C3490 Malignant neoplasm of unspecified part of unspecified bronchus or lung: Secondary | ICD-10-CM | POA: Diagnosis not present

## 2015-12-23 DIAGNOSIS — D649 Anemia, unspecified: Secondary | ICD-10-CM | POA: Diagnosis not present

## 2015-12-23 DIAGNOSIS — C719 Malignant neoplasm of brain, unspecified: Secondary | ICD-10-CM | POA: Diagnosis not present

## 2015-12-23 LAB — CEA: CEA: 364.3 ng/mL — ABNORMAL HIGH (ref 0.0–4.7)

## 2015-12-28 NOTE — Progress Notes (Signed)
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  INTERVAL HISTORY: Patient returns to clinic today for further evaluation and consideration of cycle 2 of FOLFIRI plus Avastin.  He tolerated his first treatment well without significant side effects. He currently feels well and is asymptomatic.  He has no neurologic complaints. He denies any recent fevers or illnesses. He has a good appetite and denies weight loss. He has no chest pain or shortness of breath. He denies any nausea, vomiting, constipation, or diarrhea. He has no urinary complaints. Patient offers no specific complaints today.  REVIEW OF SYSTEMS:   Review of Systems  Constitutional: Negative.  Negative for fever, malaise/fatigue and weight loss.  Respiratory: Negative.  Negative for cough and shortness of breath.   Cardiovascular: Negative.  Negative for chest pain.  Gastrointestinal: Negative.  Negative for abdominal pain.  Musculoskeletal: Negative.   Neurological: Negative.  Negative for weakness.  Psychiatric/Behavioral: Negative.     As per HPI. Otherwise, a complete review of systems is negative.  PAST MEDICAL HISTORY: Past Medical History:  Diagnosis Date  . Brain cancer ([place]) 01/2014   Chemotherapy and radiation treatment  . Colon cancer ([place]) 01/2014  . Kidney stones 1975  . Liver cancer ([place])   . Lung cancer [facility]) 2014   Radiation therapy  . Prostate cancer [facility]) 2005   treated by Dr Eliberto Ivory    PAST SURGICAL HISTORY: Past Surgical History:  Procedure Laterality Date  . COLON SURGERY  05-03-14   Resection of Sigmoid Colon and liver biopsy  . kidney stones  1975  . PORTACATH PLACEMENT  2014  . PROSTATE SURGERY  2005   partial removal due to cancer    FAMILY HISTORY: Family History  Problem Relation Age of Onset  .  Lung cancer Father   . Alzheimer's disease Mother        ADVANCED DIRECTIVES:    HEALTH MAINTENANCE: Social History  Substance Use Topics  . Smoking status: Former Smoker    Years: 35.00  . Smokeless tobacco: Never Used  . Alcohol use No     Colonoscopy:  PAP:  Bone density:  Lipid panel:  No Known Allergies  No current outpatient prescriptions on file.   No current facility-administered medications for this visit.    Facility-Administered Medications Ordered in Other Visits  Medication Dose Route Frequency Provider Last Rate Last Dose  . sodium chloride 0.9 % injection 10 mL  10 mL Intracatheter PRN [name], MD   10 mL at 07/10/14 1428    OBJECTIVE: Vitals:   12/29/15 0934  BP: 130/81  Pulse: 85  Resp: 18  Temp: 97.1 F (36.2 C)     Body mass index is 19.3 kg/m.    ECOG FS:0 - Asymptomatic  General: Well-developed, well-nourished, no acute distress. Eyes: Pink conjunctiva, anicteric sclera. Lungs: Clear to auscultation bilaterally. Heart: Regular rate and rhythm. No rubs, murmurs, or gallops. Abdomen: Soft, nontender, nondistended. No organomegaly noted, normoactive bowel sounds. Musculoskeletal: No edema, cyanosis, or clubbing. Neuro: Alert, answering all questions appropriately. Cranial nerves grossly intact. Skin: No rashes or petechiae noted. Psych: Normal affect.    LAB RESULTS:  Lab Results  Component Value Date   NA 134 (L) 12/29/2015   K 4.3 12/29/2015   CL 102 12/29/2015   CO2 27 12/29/2015   GLUCOSE 87 12/29/2015  BUN 11 12/29/2015   CREATININE 0.80 12/29/2015   CALCIUM 9.0 12/29/2015   PROT 7.9 12/29/2015   ALBUMIN 3.7 12/29/2015   AST 25 12/29/2015   ALT 18 12/29/2015   ALKPHOS 143 (H) 12/29/2015   BILITOT 0.5 12/29/2015   GFRNONAA >60 12/29/2015   GFRAA >60 12/29/2015    Lab Results  Component Value Date   WBC 4.8 12/29/2015   NEUTROABS 1.9 12/29/2015   HGB 9.7 (L) 12/29/2015   HCT 28.7 (L) 12/29/2015   MCV 95.2  12/29/2015   PLT 161 12/29/2015   Lab Results  Component Value Date   CEA 364.3 (H) 12/22/2015     STUDIES: Nm Pet Image Restag (ps) Skull Base To Thigh  Result Date: 12/08/2015 CLINICAL DATA:  Subsequent treatment strategy for carcinoma sigmoid colon. EXAM: NUCLEAR MEDICINE PET SKULL BASE TO THIGH TECHNIQUE: 12.2 mCi F-18 FDG was injected intravenously. Full-ring PET imaging was performed from the skull base to thigh after the radiotracer. CT data was obtained and used for attenuation correction and anatomic localization. FASTING BLOOD GLUCOSE:  Value: 58 mg/dl COMPARISON:  Chest CT 11/23/2015, PET-CT 5227 FINDINGS: NECK There is asymmetric hypermetabolic activity in the posterior RIGHT hypopharynx in the region of the palatini tonsil (image 36of the fused data). Sctivity is high with SUV max equal 7.0. There is an adjacent hypermetabolic level 2 lymph node on the RIGHT with SUV max equals 6.3. This is difficult define on noncontrast CT but measures approximately 10 mm short axis. CHEST No hypermetabolic mediastinal lymph nodes. Hypermetabolic pulmonary nodules. Perihilar consolidation on the RIGHT without metabolic activity. There is a 9 mm LEFT upper lobe pulmonary nodule (image 103, series 3) with out associated metabolic activity ABDOMEN/PELVIS There are multiple hypermetabolic liver metastasis. The lesions have increased in size and metabolic activity. RIGHT hepatic lobe lesion for example in the central RIGHT hepatic lobe measures approximately 7 cm with SUV max equal 15.7 increased from 4.6 cm with SUV max equal 8.0. Enlarged lesions in the central LEFT hepatic lobe measure approximately 5.3 cm with SUV max equal 14.4 increased from 3.7 cm with SUV max equal 9.5. There is a new lesion in LEFT lateral hepatic lobe with intense metabolic (SUV max equal 9.7 close. No hypermetabolic lymph nodes the abdomen pelvis. No focal metabolic activity associated with the bowel shows colorectal carcinoma  recurrence. SKELETON No focal hypermetabolic activity to suggest skeletal metastasis. IMPRESSION: 1. Progression of hepatic metastasis with enlarged lesions with increased metabolic activity. Additionally there are new lesions present. 2. No evidence of metabolically active pulmonary metastasis. 3. New asymmetric thickening within the RIGHT hypopharynx is indeterminate. Adjacent hypermetabolic RIGHT level 2 lymph node. Findings may represent pharyngitis. Clinical correlation and consider ENT direct visualization. Electronically Signed   By: Suzy Bouchard M.D.   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 He received carboplatinum and etoposide along with XRT completing in approximately January 2015. Patient had a recurrence in his liver in September 2015 and was reinitiated on treatment with carboplatinum and etoposide again. He was then diagnosed with adenocarcinoma of the sigmoid colon with biopsy-proven metastasis to the liver in March 2016. K-ras wild-type. Patient initiated FOLFOX and Vectibix in April 2016. Patient was found to have a complete response, therefore was switched to Vectibix maintenance therapy in February 2017. Patient was then found to have progressive disease on a PET scan in May 2017  and was subsequently switched to [facility]. PET scan on December 08, 2015 revealed progressive disease and patient started FOLFIRI on December 15, 2015.   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  PLAN:    1.  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 PET scan results from December 08, 2015 revealed progressive disease in patient's liver. On previous review of patient's records, he did not receive any irinotecan. CEA is now trending down. Proceed with cycle 2 of FOLFIRI + Avastin  every 2 weeks. Return to clinic in 2 days for pump removal, and then in 2 weeks for consideration of cycle 3.  Plan to reimage after 6 cycles.    2. Anemia: Mild, secondary treatment. Monitor. 3. Thrombocytopenia: Resolved. Monitor. 4. 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 Will continue to monitor with routine imaging.   Patient expressed understanding and was in agreement with this plan. He also understands that He can call clinic at any time with any questions, concerns, or complaints.   Carcinoma of sigmoid colon [facility])   Staging form: Colon and Rectum, AJCC 7th Edition   - Clinical: Stage IVB (yT3, N1a, M1b) - Unsigned  Lloyd Huger, MD   12/29/2015 10:35 AM

## 2015-12-29 ENCOUNTER — Inpatient Hospital Stay (HOSPITAL_BASED_OUTPATIENT_CLINIC_OR_DEPARTMENT_OTHER): Payer: Medicare Other | Admitting: Oncology

## 2015-12-29 ENCOUNTER — Other Ambulatory Visit: Payer: Self-pay | Admitting: Oncology

## 2015-12-29 ENCOUNTER — Inpatient Hospital Stay: Payer: Medicare Other

## 2015-12-29 VITALS — BP 130/81 | HR 85 | Temp 97.1°F | Resp 18 | Wt 150.4 lb

## 2015-12-29 DIAGNOSIS — D6481 Anemia due to antineoplastic chemotherapy: Secondary | ICD-10-CM

## 2015-12-29 DIAGNOSIS — C787 Secondary malignant neoplasm of liver and intrahepatic bile duct: Secondary | ICD-10-CM

## 2015-12-29 DIAGNOSIS — Z85841 Personal history of malignant neoplasm of brain: Secondary | ICD-10-CM

## 2015-12-29 DIAGNOSIS — Z5111 Encounter for antineoplastic chemotherapy: Secondary | ICD-10-CM | POA: Diagnosis not present

## 2015-12-29 DIAGNOSIS — R918 Other nonspecific abnormal finding of lung field: Secondary | ICD-10-CM | POA: Diagnosis not present

## 2015-12-29 DIAGNOSIS — Z5112 Encounter for antineoplastic immunotherapy: Secondary | ICD-10-CM | POA: Diagnosis not present

## 2015-12-29 DIAGNOSIS — C187 Malignant neoplasm of sigmoid colon: Secondary | ICD-10-CM

## 2015-12-29 DIAGNOSIS — Z85118 Personal history of other malignant neoplasm of bronchus and lung: Secondary | ICD-10-CM

## 2015-12-29 DIAGNOSIS — Z8546 Personal history of malignant neoplasm of prostate: Secondary | ICD-10-CM

## 2015-12-29 DIAGNOSIS — Z9049 Acquired absence of other specified parts of digestive tract: Secondary | ICD-10-CM

## 2015-12-29 DIAGNOSIS — Z87891 Personal history of nicotine dependence: Secondary | ICD-10-CM

## 2015-12-29 DIAGNOSIS — Z923 Personal history of irradiation: Secondary | ICD-10-CM

## 2015-12-29 DIAGNOSIS — D696 Thrombocytopenia, unspecified: Secondary | ICD-10-CM | POA: Diagnosis not present

## 2015-12-29 DIAGNOSIS — Z87442 Personal history of urinary calculi: Secondary | ICD-10-CM

## 2015-12-29 DIAGNOSIS — Z801 Family history of malignant neoplasm of trachea, bronchus and lung: Secondary | ICD-10-CM

## 2015-12-29 LAB — CBC WITH DIFFERENTIAL/PLATELET
BASOS ABS: 0 10*3/uL (ref 0–0.1)
BASOS PCT: 1 %
EOS PCT: 3 %
Eosinophils Absolute: 0.1 10*3/uL (ref 0–0.7)
HCT: 28.7 % — ABNORMAL LOW (ref 40.0–52.0)
Hemoglobin: 9.7 g/dL — ABNORMAL LOW (ref 13.0–18.0)
LYMPHS PCT: 49 %
Lymphs Abs: 2.4 10*3/uL (ref 1.0–3.6)
MCH: 32 pg (ref 26.0–34.0)
MCHC: 33.7 g/dL (ref 32.0–36.0)
MCV: 95.2 fL (ref 80.0–100.0)
MONO ABS: 0.4 10*3/uL (ref 0.2–1.0)
Monocytes Relative: 8 %
Neutro Abs: 1.9 10*3/uL (ref 1.4–6.5)
Neutrophils Relative %: 39 %
PLATELETS: 161 10*3/uL (ref 150–440)
RBC: 3.02 MIL/uL — AB (ref 4.40–5.90)
RDW: 20.7 % — AB (ref 11.5–14.5)
WBC: 4.8 10*3/uL (ref 3.8–10.6)

## 2015-12-29 LAB — COMPREHENSIVE METABOLIC PANEL
ALBUMIN: 3.7 g/dL (ref 3.5–5.0)
ALT: 18 U/L (ref 17–63)
AST: 25 U/L (ref 15–41)
Alkaline Phosphatase: 143 U/L — ABNORMAL HIGH (ref 38–126)
Anion gap: 5 (ref 5–15)
BUN: 11 mg/dL (ref 6–20)
CHLORIDE: 102 mmol/L (ref 101–111)
CO2: 27 mmol/L (ref 22–32)
CREATININE: 0.8 mg/dL (ref 0.61–1.24)
Calcium: 9 mg/dL (ref 8.9–10.3)
GFR calc Af Amer: >60 mL/min (ref >60)
GFR calc non Af Amer: >60 mL/min (ref >60)
GLUCOSE: 87 mg/dL (ref 65–99)
POTASSIUM: 4.3 mmol/L (ref 3.5–5.1)
SODIUM: 134 mmol/L — AB (ref 135–145)
Total Bilirubin: 0.5 mg/dL (ref 0.3–1.2)
Total Protein: 7.9 g/dL (ref 6.5–8.1)

## 2015-12-29 LAB — PROTEIN, URINE, RANDOM: Total Protein, Urine: <6 mg/dL

## 2015-12-29 MED ORDER — DEXAMETHASONE SODIUM PHOSPHATE 10 MG/ML IJ SOLN
10.0000 mg | Freq: Once | INTRAMUSCULAR | Status: AC
  Administered 2015-12-29: 10 mg via INTRAVENOUS
  Filled 2015-12-29: qty 1

## 2015-12-29 MED ORDER — SODIUM CHLORIDE 0.9 % IV SOLN
5.0000 mg/kg | Freq: Once | INTRAVENOUS | Status: AC
  Administered 2015-12-29: 350 mg via INTRAVENOUS
  Filled 2015-12-29: qty 14

## 2015-12-29 MED ORDER — LEUCOVORIN CALCIUM INJECTION 350 MG
750.0000 mg | Freq: Once | INTRAVENOUS | Status: AC
  Administered 2015-12-29: 750 mg via INTRAVENOUS
  Filled 2015-12-29: qty 35

## 2015-12-29 MED ORDER — FLUOROURACIL CHEMO INJECTION 2.5 GM/50ML
400.0000 mg/m2 | Freq: Once | INTRAVENOUS | Status: AC
  Administered 2015-12-29: 750 mg via INTRAVENOUS
  Filled 2015-12-29: qty 15

## 2015-12-29 MED ORDER — SODIUM CHLORIDE 0.9 % IV SOLN
Freq: Once | INTRAVENOUS | Status: AC
  Administered 2015-12-29: 11:00:00 via INTRAVENOUS
  Filled 2015-12-29: qty 1000

## 2015-12-29 MED ORDER — SODIUM CHLORIDE 0.9 % IV SOLN
2400.0000 mg/m2 | INTRAVENOUS | Status: DC
  Administered 2015-12-29: 4450 mg via INTRAVENOUS
  Filled 2015-12-29: qty 89

## 2015-12-29 MED ORDER — ATROPINE SULFATE 1 MG/ML IJ SOLN
0.5000 mg | Freq: Once | INTRAMUSCULAR | Status: AC | PRN
  Administered 2015-12-29: 0.5 mg via INTRAVENOUS
  Filled 2015-12-29: qty 1

## 2015-12-29 MED ORDER — DEXAMETHASONE SODIUM PHOSPHATE 100 MG/10ML IJ SOLN: 10.0000 mg | Freq: Once | INTRAMUSCULAR | Status: DC

## 2015-12-29 MED ORDER — SODIUM CHLORIDE 0.9 % IV SOLN: 5.0000 mg/kg | Freq: Once | INTRAVENOUS | Status: DC

## 2015-12-29 MED ORDER — IRINOTECAN HCL CHEMO INJECTION 100 MG/5ML
180.0000 mg/m2 | Freq: Once | INTRAVENOUS | Status: AC
  Administered 2015-12-29: 340 mg via INTRAVENOUS
  Filled 2015-12-29: qty 17

## 2015-12-29 MED ORDER — PALONOSETRON HCL INJECTION 0.25 MG/5ML
0.2500 mg | Freq: Once | INTRAVENOUS | Status: AC
  Administered 2015-12-29: 0.25 mg via INTRAVENOUS
  Filled 2015-12-29: qty 5

## 2015-12-29 NOTE — Progress Notes (Signed)
States is feeling well. Tolerated last treatment without problems.

## 2015-12-30 DIAGNOSIS — J440 Chronic obstructive pulmonary disease with acute lower respiratory infection: Secondary | ICD-10-CM | POA: Diagnosis not present

## 2015-12-30 DIAGNOSIS — C719 Malignant neoplasm of brain, unspecified: Secondary | ICD-10-CM | POA: Diagnosis not present

## 2015-12-30 DIAGNOSIS — D649 Anemia, unspecified: Secondary | ICD-10-CM | POA: Diagnosis not present

## 2015-12-30 DIAGNOSIS — J189 Pneumonia, unspecified organism: Secondary | ICD-10-CM | POA: Diagnosis not present

## 2015-12-30 DIAGNOSIS — C3490 Malignant neoplasm of unspecified part of unspecified bronchus or lung: Secondary | ICD-10-CM | POA: Diagnosis not present

## 2015-12-30 DIAGNOSIS — C189 Malignant neoplasm of colon, unspecified: Secondary | ICD-10-CM | POA: Diagnosis not present

## 2015-12-30 LAB — CEA: CEA: 318.1 ng/mL — ABNORMAL HIGH (ref 0.0–4.7)

## 2015-12-31 ENCOUNTER — Inpatient Hospital Stay: Payer: Medicare Other

## 2015-12-31 DIAGNOSIS — C189 Malignant neoplasm of colon, unspecified: Secondary | ICD-10-CM | POA: Diagnosis not present

## 2015-12-31 DIAGNOSIS — J189 Pneumonia, unspecified organism: Secondary | ICD-10-CM | POA: Diagnosis not present

## 2015-12-31 DIAGNOSIS — J440 Chronic obstructive pulmonary disease with acute lower respiratory infection: Secondary | ICD-10-CM | POA: Diagnosis not present

## 2015-12-31 DIAGNOSIS — C187 Malignant neoplasm of sigmoid colon: Secondary | ICD-10-CM | POA: Diagnosis not present

## 2015-12-31 DIAGNOSIS — Z5112 Encounter for antineoplastic immunotherapy: Secondary | ICD-10-CM | POA: Diagnosis not present

## 2015-12-31 DIAGNOSIS — C719 Malignant neoplasm of brain, unspecified: Secondary | ICD-10-CM | POA: Diagnosis not present

## 2015-12-31 DIAGNOSIS — D6481 Anemia due to antineoplastic chemotherapy: Secondary | ICD-10-CM | POA: Diagnosis not present

## 2015-12-31 DIAGNOSIS — Z5111 Encounter for antineoplastic chemotherapy: Secondary | ICD-10-CM | POA: Diagnosis not present

## 2015-12-31 DIAGNOSIS — D649 Anemia, unspecified: Secondary | ICD-10-CM | POA: Diagnosis not present

## 2015-12-31 DIAGNOSIS — C787 Secondary malignant neoplasm of liver and intrahepatic bile duct: Secondary | ICD-10-CM | POA: Diagnosis not present

## 2015-12-31 DIAGNOSIS — D696 Thrombocytopenia, unspecified: Secondary | ICD-10-CM | POA: Diagnosis not present

## 2015-12-31 DIAGNOSIS — C3490 Malignant neoplasm of unspecified part of unspecified bronchus or lung: Secondary | ICD-10-CM | POA: Diagnosis not present

## 2015-12-31 MED ORDER — SODIUM CHLORIDE 0.9% FLUSH
10.0000 mL | INTRAVENOUS | Status: DC | PRN
  Administered 2015-12-31: 10 mL via INTRAVENOUS
  Filled 2015-12-31: qty 10

## 2015-12-31 MED ORDER — HEPARIN SOD (PORK) LOCK FLUSH 100 UNIT/ML IV SOLN
500.0000 [IU] | Freq: Once | INTRAVENOUS | Status: AC
  Administered 2015-12-31: 500 [IU] via INTRAVENOUS
  Filled 2015-12-31: qty 5

## 2016-01-05 ENCOUNTER — Inpatient Hospital Stay: Payer: Medicare Other | Admitting: Oncology

## 2016-01-05 ENCOUNTER — Inpatient Hospital Stay: Payer: Medicare Other

## 2016-01-06 DIAGNOSIS — C3490 Malignant neoplasm of unspecified part of unspecified bronchus or lung: Secondary | ICD-10-CM | POA: Diagnosis not present

## 2016-01-06 DIAGNOSIS — C189 Malignant neoplasm of colon, unspecified: Secondary | ICD-10-CM | POA: Diagnosis not present

## 2016-01-06 DIAGNOSIS — D649 Anemia, unspecified: Secondary | ICD-10-CM | POA: Diagnosis not present

## 2016-01-06 DIAGNOSIS — J440 Chronic obstructive pulmonary disease with acute lower respiratory infection: Secondary | ICD-10-CM | POA: Diagnosis not present

## 2016-01-06 DIAGNOSIS — J189 Pneumonia, unspecified organism: Secondary | ICD-10-CM | POA: Diagnosis not present

## 2016-01-06 DIAGNOSIS — C719 Malignant neoplasm of brain, unspecified: Secondary | ICD-10-CM | POA: Diagnosis not present

## 2016-01-12 ENCOUNTER — Inpatient Hospital Stay: Payer: Medicare Other

## 2016-01-12 ENCOUNTER — Inpatient Hospital Stay (HOSPITAL_BASED_OUTPATIENT_CLINIC_OR_DEPARTMENT_OTHER): Payer: Medicare Other | Admitting: Oncology

## 2016-01-12 VITALS — BP 115/80 | HR 80 | Temp 97.1°F | Resp 16 | Wt 147.7 lb

## 2016-01-12 DIAGNOSIS — C787 Secondary malignant neoplasm of liver and intrahepatic bile duct: Secondary | ICD-10-CM

## 2016-01-12 DIAGNOSIS — D696 Thrombocytopenia, unspecified: Secondary | ICD-10-CM | POA: Diagnosis not present

## 2016-01-12 DIAGNOSIS — D6481 Anemia due to antineoplastic chemotherapy: Secondary | ICD-10-CM | POA: Diagnosis not present

## 2016-01-12 DIAGNOSIS — Z85841 Personal history of malignant neoplasm of brain: Secondary | ICD-10-CM

## 2016-01-12 DIAGNOSIS — C187 Malignant neoplasm of sigmoid colon: Secondary | ICD-10-CM

## 2016-01-12 DIAGNOSIS — Z87442 Personal history of urinary calculi: Secondary | ICD-10-CM

## 2016-01-12 DIAGNOSIS — Z87891 Personal history of nicotine dependence: Secondary | ICD-10-CM

## 2016-01-12 DIAGNOSIS — Z9049 Acquired absence of other specified parts of digestive tract: Secondary | ICD-10-CM

## 2016-01-12 DIAGNOSIS — Z85118 Personal history of other malignant neoplasm of bronchus and lung: Secondary | ICD-10-CM

## 2016-01-12 DIAGNOSIS — Z801 Family history of malignant neoplasm of trachea, bronchus and lung: Secondary | ICD-10-CM

## 2016-01-12 DIAGNOSIS — R918 Other nonspecific abnormal finding of lung field: Secondary | ICD-10-CM

## 2016-01-12 DIAGNOSIS — Z8546 Personal history of malignant neoplasm of prostate: Secondary | ICD-10-CM

## 2016-01-12 DIAGNOSIS — Z5111 Encounter for antineoplastic chemotherapy: Secondary | ICD-10-CM | POA: Diagnosis not present

## 2016-01-12 DIAGNOSIS — Z5112 Encounter for antineoplastic immunotherapy: Secondary | ICD-10-CM | POA: Diagnosis not present

## 2016-01-12 DIAGNOSIS — Z923 Personal history of irradiation: Secondary | ICD-10-CM

## 2016-01-12 LAB — COMPREHENSIVE METABOLIC PANEL
ALBUMIN: 3.9 g/dL (ref 3.5–5.0)
ALK PHOS: 99 U/L (ref 38–126)
ALT: 15 U/L — ABNORMAL LOW (ref 17–63)
ANION GAP: 7 (ref 5–15)
AST: 19 U/L (ref 15–41)
BILIRUBIN TOTAL: 0.6 mg/dL (ref 0.3–1.2)
BUN: 9 mg/dL (ref 6–20)
CALCIUM: 9.2 mg/dL (ref 8.9–10.3)
CO2: 26 mmol/L (ref 22–32)
Chloride: 102 mmol/L (ref 101–111)
Creatinine, Ser: 0.87 mg/dL (ref 0.61–1.24)
GLUCOSE: 87 mg/dL (ref 65–99)
Potassium: 4 mmol/L (ref 3.5–5.1)
Sodium: 135 mmol/L (ref 135–145)
TOTAL PROTEIN: 7.5 g/dL (ref 6.5–8.1)

## 2016-01-12 LAB — CBC WITH DIFFERENTIAL/PLATELET
Basophils Absolute: 0 10*3/uL (ref 0–0.1)
Basophils Relative: 0 %
Eosinophils Absolute: 0.1 10*3/uL (ref 0–0.7)
Eosinophils Relative: 3 %
HEMATOCRIT: 28.7 % — AB (ref 40.0–52.0)
HEMOGLOBIN: 9.9 g/dL — AB (ref 13.0–18.0)
LYMPHS ABS: 2.2 10*3/uL (ref 1.0–3.6)
LYMPHS PCT: 62 %
MCH: 32.5 pg (ref 26.0–34.0)
MCHC: 34.4 g/dL (ref 32.0–36.0)
MCV: 94.5 fL (ref 80.0–100.0)
MONOS PCT: 8 %
Monocytes Absolute: 0.3 10*3/uL (ref 0.2–1.0)
NEUTROS ABS: 0.9 10*3/uL — AB (ref 1.4–6.5)
NEUTROS PCT: 27 %
Platelets: 93 10*3/uL — ABNORMAL LOW (ref 150–440)
RBC: 3.04 MIL/uL — ABNORMAL LOW (ref 4.40–5.90)
RDW: 19.8 % — ABNORMAL HIGH (ref 11.5–14.5)
WBC: 3.5 10*3/uL — ABNORMAL LOW (ref 3.8–10.6)

## 2016-01-12 LAB — PROTEIN, URINE, RANDOM: Total Protein, Urine: <6 mg/dL

## 2016-01-12 MED ORDER — SODIUM CHLORIDE 0.9 % IV SOLN
Freq: Once | INTRAVENOUS | Status: AC
  Administered 2016-01-12: 11:00:00 via INTRAVENOUS
  Filled 2016-01-12: qty 1000

## 2016-01-12 MED ORDER — FLUOROURACIL CHEMO INJECTION 2.5 GM/50ML
400.0000 mg/m2 | Freq: Once | INTRAVENOUS | Status: AC
  Administered 2016-01-12: 750 mg via INTRAVENOUS
  Filled 2016-01-12: qty 15

## 2016-01-12 MED ORDER — SODIUM CHLORIDE 0.9 % IV SOLN
2400.0000 mg/m2 | INTRAVENOUS | Status: DC
  Administered 2016-01-12: 4450 mg via INTRAVENOUS
  Filled 2016-01-12: qty 89

## 2016-01-12 MED ORDER — IRINOTECAN HCL CHEMO INJECTION 100 MG/5ML
180.0000 mg/m2 | Freq: Once | INTRAVENOUS | Status: AC
  Administered 2016-01-12: 340 mg via INTRAVENOUS
  Filled 2016-01-12: qty 15

## 2016-01-12 MED ORDER — HEPARIN SOD (PORK) LOCK FLUSH 100 UNIT/ML IV SOLN
INTRAVENOUS | Status: AC
  Filled 2016-01-12: qty 5

## 2016-01-12 MED ORDER — LEUCOVORIN CALCIUM INJECTION 350 MG
750.0000 mg | Freq: Once | INTRAVENOUS | Status: AC
  Administered 2016-01-12: 750 mg via INTRAVENOUS
  Filled 2016-01-12: qty 35

## 2016-01-12 MED ORDER — SODIUM CHLORIDE 0.9% FLUSH
10.0000 mL | INTRAVENOUS | Status: DC | PRN
  Administered 2016-01-12: 10 mL
  Filled 2016-01-12: qty 10

## 2016-01-12 MED ORDER — DEXAMETHASONE SODIUM PHOSPHATE 10 MG/ML IJ SOLN
10.0000 mg | Freq: Once | INTRAMUSCULAR | Status: AC
  Administered 2016-01-12: 10 mg via INTRAVENOUS
  Filled 2016-01-12: qty 1

## 2016-01-12 MED ORDER — PALONOSETRON HCL INJECTION 0.25 MG/5ML
0.2500 mg | Freq: Once | INTRAVENOUS | Status: AC
  Administered 2016-01-12: 0.25 mg via INTRAVENOUS
  Filled 2016-01-12: qty 5

## 2016-01-12 MED ORDER — SODIUM CHLORIDE 0.9% FLUSH
10.0000 mL | INTRAVENOUS | Status: DC | PRN
  Filled 2016-01-12: qty 10

## 2016-01-12 MED ORDER — SODIUM CHLORIDE 0.9 % IV SOLN: 10.0000 mg | Freq: Once | INTRAVENOUS | Status: DC

## 2016-01-12 MED ORDER — ATROPINE SULFATE 1 MG/ML IJ SOLN
0.5000 mg | Freq: Once | INTRAMUSCULAR | Status: AC | PRN
  Administered 2016-01-12: 0.5 mg via INTRAVENOUS
  Filled 2016-01-12: qty 1

## 2016-01-12 MED ORDER — DEXAMETHASONE SODIUM PHOSPHATE 10 MG/ML IJ SOLN
10.0000 mg | Freq: Once | INTRAMUSCULAR | Status: DC
  Filled 2016-01-12: qty 1

## 2016-01-12 MED ORDER — SODIUM CHLORIDE 0.9 % IV SOLN
300.0000 mg | Freq: Once | INTRAVENOUS | Status: AC
  Administered 2016-01-12: 300 mg via INTRAVENOUS
  Filled 2016-01-12: qty 12

## 2016-01-12 NOTE — Progress Notes (Signed)
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  INTERVAL HISTORY: Patient returns to clinic today for further evaluation and consideration of cycle 3 of FOLFIRI plus Avastin.  He is tolerating his treatments well without significant side effects. He currently feels well and is asymptomatic.  He has no neurologic complaints. He denies any recent fevers or illnesses. He has a good appetite and denies weight loss. He has no chest pain or shortness of breath. He denies any nausea, vomiting, constipation, or diarrhea. He has no urinary complaints. Patient offers no specific complaints today.  REVIEW OF SYSTEMS:   Review of Systems  Constitutional: Negative.  Negative for fever, malaise/fatigue and weight loss.  Respiratory: Negative.  Negative for cough and shortness of breath.   Cardiovascular: Negative.  Negative for chest pain.  Gastrointestinal: Negative.  Negative for abdominal pain.  Musculoskeletal: Negative.   Neurological: Negative.  Negative for weakness.  Psychiatric/Behavioral: Negative.     As per HPI. Otherwise, a complete review of systems is negative.  PAST MEDICAL HISTORY: Past Medical History:  Diagnosis Date  . Brain cancer ([place]) 01/2014   Chemotherapy and radiation treatment  . Colon cancer ([place]) 01/2014  . Kidney stones 1975  . Liver cancer ([place])   . Lung cancer [facility]) 2014   Radiation therapy  . Prostate cancer [facility]) 2005   treated by Dr Eliberto Ivory    PAST SURGICAL HISTORY: Past Surgical History:  Procedure Laterality Date  . COLON SURGERY  05-03-14   Resection of Sigmoid Colon and liver biopsy  . kidney stones  1975  . PORTACATH PLACEMENT  2014  . PROSTATE SURGERY  2005   partial removal due to cancer    FAMILY HISTORY: Family History  Problem Relation Age of Onset  .  Lung cancer Father   . Alzheimer's disease Mother        ADVANCED DIRECTIVES:    HEALTH MAINTENANCE: Social History  Substance Use Topics  . Smoking status: Former Smoker    Years: 35.00  . Smokeless tobacco: Never Used  . Alcohol use No     Colonoscopy:  PAP:  Bone density:  Lipid panel:  No Known Allergies  No current outpatient prescriptions on file.   No current facility-administered medications for this visit.    Facility-Administered Medications Ordered in Other Visits  Medication Dose Route Frequency Provider Last Rate Last Dose  . sodium chloride 0.9 % injection 10 mL  10 mL Intracatheter PRN [name], MD   10 mL at 07/10/14 1428    OBJECTIVE: Vitals:   01/12/16 0958  BP: 115/80  Pulse: 80  Resp: 16  Temp: 97.1 F (36.2 C)     Body mass index is 18.96 kg/m.    ECOG FS:0 - Asymptomatic  General: Well-developed, well-nourished, no acute distress. Eyes: Pink conjunctiva, anicteric sclera. Lungs: Clear to auscultation bilaterally. Heart: Regular rate and rhythm. No rubs, murmurs, or gallops. Abdomen: Soft, nontender, nondistended. No organomegaly noted, normoactive bowel sounds. Musculoskeletal: No edema, cyanosis, or clubbing. Neuro: Alert, answering all questions appropriately. Cranial nerves grossly intact. Skin: No rashes or petechiae noted. Psych: Normal affect.    LAB RESULTS:  Lab Results  Component Value Date   NA 135 01/12/2016   K 4.0 01/12/2016   CL 102 01/12/2016   CO2 26 01/12/2016   GLUCOSE 87 01/12/2016   BUN  9 01/12/2016   CREATININE 0.87 01/12/2016   CALCIUM 9.2 01/12/2016   PROT 7.5 01/12/2016   ALBUMIN 3.9 01/12/2016   AST 19 01/12/2016   ALT 15 (L) 01/12/2016   ALKPHOS 99 01/12/2016   BILITOT 0.6 01/12/2016   GFRNONAA >60 01/12/2016   GFRAA >60 01/12/2016    Lab Results  Component Value Date   WBC 3.5 (L) 01/12/2016   NEUTROABS 0.9 (L) 01/12/2016   HGB 9.9 (L) 01/12/2016   HCT 28.7 (L) 01/12/2016   MCV  94.5 01/12/2016   PLT 93 (L) 01/12/2016   Lab Results  Component Value Date   CEA 318.1 (H) 12/29/2015     STUDIES: No results found.  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 He received carboplatinum and etoposide along with XRT completing in approximately January 2015. Patient had a recurrence in his liver in September 2015 and was reinitiated on treatment with carboplatinum and etoposide again. He was then diagnosed with adenocarcinoma of the sigmoid colon with biopsy-proven metastasis to the liver in March 2016. K-ras wild-type. Patient initiated FOLFOX and Vectibix in April 2016. Patient was found to have a complete response, therefore was switched to Vectibix maintenance therapy in February 2017. Patient was then found to have progressive disease on a PET scan in May 2017 and was subsequently switched to [facility]. PET scan on December 08, 2015 revealed progressive disease and patient started FOLFIRI on December 15, 2015.   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  PLAN:    1.  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 PET scan results from December 08, 2015 revealed progressive disease in patient's liver. On previous review of patient's records, he did not receive any irinotecan. CEA is now trending down. Proceed with cycle 3 of FOLFIRI + Avastin every 2 weeks despite thrombocytopenia. Return to clinic in 2 days for pump removal, and then in 2 weeks for consideration of cycle 4.  Will dose reduce irinotecan if thrombocytopenia persists. Plan to reimage after 6 cycles.    2. Anemia: Mild, secondary treatment. Monitor. 3. Thrombocytopenia: Proceed with treatment as above. Monitor closely with Avastin. 4. 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 Will continue to  monitor with routine imaging.   Patient expressed understanding and was in agreement with this plan. He also understands that He can call clinic at any time with any questions, concerns, or complaints.   Carcinoma of sigmoid colon [facility])   Staging form: Colon and Rectum, AJCC 7th Edition   - Clinical: Stage IVB (yT3, N1a, M1b) - Unsigned  Lloyd Huger, MD   01/12/2016 10:46 AM

## 2016-01-12 NOTE — Progress Notes (Signed)
ANC 0.9, plt =93. MD ok to proceed with treatment today

## 2016-01-12 NOTE — Progress Notes (Signed)
Only problem patient offers today is a pain on the side of his tongue that last about 4-5 days he thinks it may have been a swollen gland.

## 2016-01-13 LAB — CEA: CEA: 255.5 ng/mL — ABNORMAL HIGH (ref 0.0–4.7)

## 2016-01-14 ENCOUNTER — Inpatient Hospital Stay: Payer: Medicare Other

## 2016-01-14 DIAGNOSIS — C787 Secondary malignant neoplasm of liver and intrahepatic bile duct: Secondary | ICD-10-CM | POA: Diagnosis not present

## 2016-01-14 DIAGNOSIS — D6481 Anemia due to antineoplastic chemotherapy: Secondary | ICD-10-CM | POA: Diagnosis not present

## 2016-01-14 DIAGNOSIS — D696 Thrombocytopenia, unspecified: Secondary | ICD-10-CM | POA: Diagnosis not present

## 2016-01-14 DIAGNOSIS — Z5111 Encounter for antineoplastic chemotherapy: Secondary | ICD-10-CM | POA: Diagnosis not present

## 2016-01-14 DIAGNOSIS — Z5112 Encounter for antineoplastic immunotherapy: Secondary | ICD-10-CM | POA: Diagnosis not present

## 2016-01-14 DIAGNOSIS — C187 Malignant neoplasm of sigmoid colon: Secondary | ICD-10-CM

## 2016-01-14 MED ORDER — SODIUM CHLORIDE 0.9% FLUSH
10.0000 mL | INTRAVENOUS | Status: DC | PRN
  Administered 2016-01-14: 10 mL
  Filled 2016-01-14: qty 10

## 2016-01-14 MED ORDER — HEPARIN SOD (PORK) LOCK FLUSH 100 UNIT/ML IV SOLN
INTRAVENOUS | Status: AC
  Filled 2016-01-14: qty 5

## 2016-01-14 MED ORDER — HEPARIN SOD (PORK) LOCK FLUSH 100 UNIT/ML IV SOLN
500.0000 [IU] | Freq: Once | INTRAVENOUS | Status: AC | PRN
  Administered 2016-01-14: 500 [IU]

## 2016-01-15 DIAGNOSIS — J440 Chronic obstructive pulmonary disease with acute lower respiratory infection: Secondary | ICD-10-CM | POA: Diagnosis not present

## 2016-01-15 DIAGNOSIS — C189 Malignant neoplasm of colon, unspecified: Secondary | ICD-10-CM | POA: Diagnosis not present

## 2016-01-15 DIAGNOSIS — C3490 Malignant neoplasm of unspecified part of unspecified bronchus or lung: Secondary | ICD-10-CM | POA: Diagnosis not present

## 2016-01-15 DIAGNOSIS — C719 Malignant neoplasm of brain, unspecified: Secondary | ICD-10-CM | POA: Diagnosis not present

## 2016-01-15 DIAGNOSIS — D649 Anemia, unspecified: Secondary | ICD-10-CM | POA: Diagnosis not present

## 2016-01-15 DIAGNOSIS — J189 Pneumonia, unspecified organism: Secondary | ICD-10-CM | POA: Diagnosis not present

## 2016-01-25 NOTE — Progress Notes (Signed)
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  INTERVAL HISTORY: Patient returns to clinic today for further evaluation and consideration of cycle 4 of FOLFIRI plus Avastin.  He is tolerating his treatments well without significant side effects. He currently feels well and is asymptomatic.  He has no neurologic complaints. He denies any recent fevers or illnesses. He has a good appetite and denies weight loss. He has no chest pain or shortness of breath. He denies any nausea, vomiting, constipation, or diarrhea. He has no urinary complaints. Patient offers no specific complaints today.  REVIEW OF SYSTEMS:   Review of Systems  Constitutional: Negative.  Negative for fever, malaise/fatigue and weight loss.  Respiratory: Negative.  Negative for cough and shortness of breath.   Cardiovascular: Negative.  Negative for chest pain.  Gastrointestinal: Negative.  Negative for abdominal pain.  Musculoskeletal: Negative.   Neurological: Negative.  Negative for weakness.  Psychiatric/Behavioral: Negative.     As per HPI. Otherwise, a complete review of systems is negative.  PAST MEDICAL HISTORY: Past Medical History:  Diagnosis Date  . Brain cancer ([place]) 01/2014   Chemotherapy and radiation treatment  . Colon cancer ([place]) 01/2014  . Kidney stones 1975  . Liver cancer ([place])   . Lung cancer [facility]) 2014   Radiation therapy  . Prostate cancer [facility]) 2005   treated by Dr Eliberto Ivory    PAST SURGICAL HISTORY: Past Surgical History:  Procedure Laterality Date  . COLON SURGERY  05-03-14   Resection of Sigmoid Colon and liver biopsy  . kidney stones  1975  . PORTACATH PLACEMENT  2014  . PROSTATE SURGERY  2005   partial removal due to cancer    FAMILY HISTORY: Family History  Problem Relation Age of Onset  .  Lung cancer Father   . Alzheimer's disease Mother        ADVANCED DIRECTIVES:    HEALTH MAINTENANCE: Social History  Substance Use Topics  . Smoking status: Former Smoker    Years: 35.00  . Smokeless tobacco: Never Used  . Alcohol use No     Colonoscopy:  PAP:  Bone density:  Lipid panel:  No Known Allergies  No current outpatient prescriptions on file.   No current facility-administered medications for this visit.    Facility-Administered Medications Ordered in Other Visits  Medication Dose Route Frequency Provider Last Rate Last Dose  . fluorouracil (ADRUCIL) 4,450 mg in sodium chloride 0.9 % 61 mL chemo infusion  2,400 mg/m2 (Treatment Plan Recorded) Intravenous 1 day or 1 dose Lloyd Huger, MD      . fluorouracil (ADRUCIL) chemo injection 750 mg  400 mg/m2 (Treatment Plan Recorded) Intravenous Once Lloyd Huger, MD      . heparin lock flush 100 unit/mL  500 Units Intracatheter Once PRN Lloyd Huger, MD      . irinotecan (CAMPTOSAR) 340 mg in dextrose 5 % 500 mL chemo infusion  180 mg/m2 (Treatment Plan Recorded) Intravenous Once Lloyd Huger, MD 345 mL/hr at 01/26/16 1206 340 mg at 01/26/16 1206  . leucovorin 750 mg in dextrose 5 % 250 mL infusion  750 mg Intravenous Once Lloyd Huger, MD 192 mL/hr at 01/26/16 1206 750 mg at 01/26/16 1206  . sodium chloride 0.9 % injection 10 mL  10 mL Intracatheter PRN [name], MD   10  mL at 07/10/14 1428  . sodium chloride flush (NS) 0.9 % injection 10 mL  10 mL Intracatheter PRN Lloyd Huger, MD   10 mL at 01/26/16 0935    OBJECTIVE: Vitals:   01/26/16 1003  BP: (!) 150/83  Pulse: (!) 54  Resp: 18  Temp: (!) 96.6 F (35.9 C)     Body mass index is 19.32 kg/m.    ECOG FS:0 - Asymptomatic  General: Well-developed, well-nourished, no acute distress. Eyes: Pink conjunctiva, anicteric sclera. Lungs: Clear to auscultation bilaterally. Heart: Regular rate and rhythm. No rubs, murmurs, or  gallops. Abdomen: Soft, nontender, nondistended. No organomegaly noted, normoactive bowel sounds. Musculoskeletal: No edema, cyanosis, or clubbing. Neuro: Alert, answering all questions appropriately. Cranial nerves grossly intact. Skin: No rashes or petechiae noted. Psych: Normal affect.    LAB RESULTS:  Lab Results  Component Value Date   NA 134 (L) 01/26/2016   K 3.9 01/26/2016   CL 103 01/26/2016   CO2 27 01/26/2016   GLUCOSE 90 01/26/2016   BUN 10 01/26/2016   CREATININE 0.79 01/26/2016   CALCIUM 9.1 01/26/2016   PROT 7.4 01/26/2016   ALBUMIN 3.9 01/26/2016   AST 17 01/26/2016   ALT 12 (L) 01/26/2016   ALKPHOS 90 01/26/2016   BILITOT 0.5 01/26/2016   GFRNONAA >60 01/26/2016   GFRAA >60 01/26/2016    Lab Results  Component Value Date   WBC 3.5 (L) 01/26/2016   NEUTROABS 0.7 (L) 01/26/2016   HGB 10.0 (L) 01/26/2016   HCT 29.7 (L) 01/26/2016   MCV 95.7 01/26/2016   PLT 96 (L) 01/26/2016   Lab Results  Component Value Date   CEA 255.5 (H) 01/12/2016     STUDIES: No results found.  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 He received carboplatinum and etoposide along with XRT completing in approximately January 2015. Patient had a recurrence in his liver in September 2015 and was reinitiated on treatment with carboplatinum and etoposide again. He was then diagnosed with adenocarcinoma of the sigmoid colon with biopsy-proven metastasis to the liver in March 2016. K-ras wild-type. Patient initiated FOLFOX and Vectibix in April 2016. Patient was found to have a complete response, therefore was switched to Vectibix maintenance therapy in February 2017. Patient was then found to have progressive disease on a PET scan in May 2017 and was subsequently switched to [facility]. PET scan on December 08, 2015 revealed progressive disease and patient started FOLFIRI on December 15, 2015.   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  PLAN:    1.  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 PET scan results from December 08, 2015 revealed progressive disease in patient's liver. On previous review of patient's records, he did not receive any irinotecan. CEA is now trending down. Proceed with cycle 4 of FOLFIRI + Avastin every 2 weeks despite thrombocytopenia and neutropenia. Return to clinic in 2 days for pump removal and Neulasta, and then in 2 weeks for consideration of cycle 5.  Will dose reduce irinotecan if thrombocytopenia persists. Plan to reimage after 6 cycles.    2. Anemia: Mild, secondary treatment. Monitor. 3. Thrombocytopenia: Proceed with treatment as above. Monitor closely with Avastin. 4. 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 Will continue to monitor with routine imaging. 5. Neutropenia: Add  Neulasta with the remainder of cycles.   Patient expressed understanding and was in agreement with this plan. He also understands that He can call clinic at any time with any questions, concerns, or complaints.   Carcinoma of sigmoid colon [facility])   Staging form: Colon and Rectum, AJCC 7th Edition   - Clinical: Stage IVB (yT3, N1a, M1b) - Unsigned  Lloyd Huger, MD   01/26/2016 1:03 PM

## 2016-01-26 ENCOUNTER — Inpatient Hospital Stay (HOSPITAL_BASED_OUTPATIENT_CLINIC_OR_DEPARTMENT_OTHER): Payer: Medicare Other | Admitting: Oncology

## 2016-01-26 ENCOUNTER — Inpatient Hospital Stay: Payer: Medicare Other

## 2016-01-26 ENCOUNTER — Inpatient Hospital Stay: Payer: Medicare Other | Attending: Oncology

## 2016-01-26 VITALS — BP 130/83 | HR 80

## 2016-01-26 VITALS — BP 150/83 | HR 54 | Temp 96.6°F | Resp 18 | Wt 150.5 lb

## 2016-01-26 DIAGNOSIS — Z85841 Personal history of malignant neoplasm of brain: Secondary | ICD-10-CM | POA: Diagnosis not present

## 2016-01-26 DIAGNOSIS — C187 Malignant neoplasm of sigmoid colon: Secondary | ICD-10-CM | POA: Diagnosis not present

## 2016-01-26 DIAGNOSIS — D709 Neutropenia, unspecified: Secondary | ICD-10-CM

## 2016-01-26 DIAGNOSIS — Z85118 Personal history of other malignant neoplasm of bronchus and lung: Secondary | ICD-10-CM | POA: Insufficient documentation

## 2016-01-26 DIAGNOSIS — D6481 Anemia due to antineoplastic chemotherapy: Secondary | ICD-10-CM | POA: Insufficient documentation

## 2016-01-26 DIAGNOSIS — Z87891 Personal history of nicotine dependence: Secondary | ICD-10-CM | POA: Diagnosis not present

## 2016-01-26 DIAGNOSIS — Z800 Family history of malignant neoplasm of digestive organs: Secondary | ICD-10-CM | POA: Insufficient documentation

## 2016-01-26 DIAGNOSIS — C787 Secondary malignant neoplasm of liver and intrahepatic bile duct: Secondary | ICD-10-CM | POA: Diagnosis not present

## 2016-01-26 DIAGNOSIS — Z9221 Personal history of antineoplastic chemotherapy: Secondary | ICD-10-CM

## 2016-01-26 DIAGNOSIS — Z923 Personal history of irradiation: Secondary | ICD-10-CM | POA: Diagnosis not present

## 2016-01-26 DIAGNOSIS — Z8546 Personal history of malignant neoplasm of prostate: Secondary | ICD-10-CM

## 2016-01-26 DIAGNOSIS — D696 Thrombocytopenia, unspecified: Secondary | ICD-10-CM | POA: Diagnosis not present

## 2016-01-26 DIAGNOSIS — Z87442 Personal history of urinary calculi: Secondary | ICD-10-CM | POA: Diagnosis not present

## 2016-01-26 DIAGNOSIS — Z5111 Encounter for antineoplastic chemotherapy: Secondary | ICD-10-CM | POA: Insufficient documentation

## 2016-01-26 LAB — COMPREHENSIVE METABOLIC PANEL
ALK PHOS: 90 U/L (ref 38–126)
ALT: 12 U/L — ABNORMAL LOW (ref 17–63)
ANION GAP: 4 — AB (ref 5–15)
AST: 17 U/L (ref 15–41)
Albumin: 3.9 g/dL (ref 3.5–5.0)
BILIRUBIN TOTAL: 0.5 mg/dL (ref 0.3–1.2)
BUN: 10 mg/dL (ref 6–20)
CALCIUM: 9.1 mg/dL (ref 8.9–10.3)
CO2: 27 mmol/L (ref 22–32)
Chloride: 103 mmol/L (ref 101–111)
Creatinine, Ser: 0.79 mg/dL (ref 0.61–1.24)
Glucose, Bld: 90 mg/dL (ref 65–99)
Potassium: 3.9 mmol/L (ref 3.5–5.1)
SODIUM: 134 mmol/L — AB (ref 135–145)
TOTAL PROTEIN: 7.4 g/dL (ref 6.5–8.1)

## 2016-01-26 LAB — CBC WITH DIFFERENTIAL/PLATELET
Basophils Absolute: 0 10*3/uL (ref 0–0.1)
Basophils Relative: 0 %
Eosinophils Absolute: 0.1 10*3/uL (ref 0–0.7)
Eosinophils Relative: 2 %
HCT: 29.7 % — ABNORMAL LOW (ref 40.0–52.0)
HEMOGLOBIN: 10 g/dL — AB (ref 13.0–18.0)
LYMPHS ABS: 2.4 10*3/uL (ref 1.0–3.6)
LYMPHS PCT: 69 %
MCH: 32.3 pg (ref 26.0–34.0)
MCHC: 33.8 g/dL (ref 32.0–36.0)
MCV: 95.7 fL (ref 80.0–100.0)
Monocytes Absolute: 0.3 10*3/uL (ref 0.2–1.0)
Monocytes Relative: 8 %
NEUTROS PCT: 21 %
Neutro Abs: 0.7 10*3/uL — ABNORMAL LOW (ref 1.4–6.5)
Platelets: 96 10*3/uL — ABNORMAL LOW (ref 150–440)
RBC: 3.11 MIL/uL — AB (ref 4.40–5.90)
RDW: 19 % — ABNORMAL HIGH (ref 11.5–14.5)
WBC: 3.5 10*3/uL — AB (ref 3.8–10.6)

## 2016-01-26 LAB — PROTEIN, URINE, RANDOM

## 2016-01-26 MED ORDER — LEUCOVORIN CALCIUM INJECTION 350 MG
750.0000 mg | Freq: Once | INTRAVENOUS | Status: AC
  Administered 2016-01-26: 750 mg via INTRAVENOUS
  Filled 2016-01-26: qty 35

## 2016-01-26 MED ORDER — SODIUM CHLORIDE 0.9 % IV SOLN
2400.0000 mg/m2 | INTRAVENOUS | Status: DC
  Administered 2016-01-26: 4450 mg via INTRAVENOUS
  Filled 2016-01-26: qty 89

## 2016-01-26 MED ORDER — SODIUM CHLORIDE 0.9 % IV SOLN
300.0000 mg | Freq: Once | INTRAVENOUS | Status: AC
  Administered 2016-01-26: 300 mg via INTRAVENOUS
  Filled 2016-01-26: qty 12

## 2016-01-26 MED ORDER — HEPARIN SOD (PORK) LOCK FLUSH 100 UNIT/ML IV SOLN: 500.0000 [IU] | Freq: Once | INTRAVENOUS | Status: DC | PRN

## 2016-01-26 MED ORDER — PALONOSETRON HCL INJECTION 0.25 MG/5ML
0.2500 mg | Freq: Once | INTRAVENOUS | Status: AC
  Administered 2016-01-26: 0.25 mg via INTRAVENOUS
  Filled 2016-01-26: qty 5

## 2016-01-26 MED ORDER — FLUOROURACIL CHEMO INJECTION 2.5 GM/50ML
400.0000 mg/m2 | Freq: Once | INTRAVENOUS | Status: AC
  Administered 2016-01-26: 750 mg via INTRAVENOUS
  Filled 2016-01-26: qty 15

## 2016-01-26 MED ORDER — DEXAMETHASONE SODIUM PHOSPHATE 10 MG/ML IJ SOLN
10.0000 mg | Freq: Once | INTRAMUSCULAR | Status: AC
  Administered 2016-01-26: 10 mg via INTRAVENOUS
  Filled 2016-01-26: qty 1

## 2016-01-26 MED ORDER — SODIUM CHLORIDE 0.9% FLUSH
10.0000 mL | INTRAVENOUS | Status: DC | PRN
  Administered 2016-01-26: 10 mL
  Filled 2016-01-26: qty 10

## 2016-01-26 MED ORDER — DEXTROSE 5 % IV SOLN
180.0000 mg/m2 | Freq: Once | INTRAVENOUS | Status: AC
  Administered 2016-01-26: 340 mg via INTRAVENOUS
  Filled 2016-01-26: qty 17

## 2016-01-26 MED ORDER — SODIUM CHLORIDE 0.9 % IV SOLN: 10.0000 mg | Freq: Once | INTRAVENOUS | Status: DC

## 2016-01-26 MED ORDER — SODIUM CHLORIDE 0.9 % IV SOLN
Freq: Once | INTRAVENOUS | Status: AC
  Administered 2016-01-26: 11:00:00 via INTRAVENOUS
  Filled 2016-01-26: qty 1000

## 2016-01-26 MED ORDER — LEUCOVORIN CALCIUM INJECTION 350 MG: 750.0000 mg/m2 | Freq: Once | INTRAVENOUS | Status: DC

## 2016-01-26 NOTE — Progress Notes (Signed)
Offers no complaints. Feeling well. 

## 2016-01-27 LAB — CEA: CEA: 182.2 ng/mL — AB (ref 0.0–4.7)

## 2016-01-28 ENCOUNTER — Inpatient Hospital Stay: Payer: Medicare Other

## 2016-01-28 VITALS — BP 116/81 | HR 90 | Temp 96.9°F | Resp 18

## 2016-01-28 DIAGNOSIS — C787 Secondary malignant neoplasm of liver and intrahepatic bile duct: Secondary | ICD-10-CM | POA: Diagnosis not present

## 2016-01-28 DIAGNOSIS — Z5111 Encounter for antineoplastic chemotherapy: Secondary | ICD-10-CM | POA: Diagnosis not present

## 2016-01-28 DIAGNOSIS — D6481 Anemia due to antineoplastic chemotherapy: Secondary | ICD-10-CM | POA: Diagnosis not present

## 2016-01-28 DIAGNOSIS — D709 Neutropenia, unspecified: Secondary | ICD-10-CM | POA: Diagnosis not present

## 2016-01-28 DIAGNOSIS — C187 Malignant neoplasm of sigmoid colon: Secondary | ICD-10-CM | POA: Diagnosis not present

## 2016-01-28 DIAGNOSIS — D696 Thrombocytopenia, unspecified: Secondary | ICD-10-CM | POA: Diagnosis not present

## 2016-01-28 MED ORDER — SODIUM CHLORIDE 0.9% FLUSH
10.0000 mL | INTRAVENOUS | Status: DC | PRN
  Administered 2016-01-28: 10 mL
  Filled 2016-01-28: qty 10

## 2016-01-28 MED ORDER — PEGFILGRASTIM INJECTION 6 MG/0.6ML ~~LOC~~
6.0000 mg | PREFILLED_SYRINGE | Freq: Once | SUBCUTANEOUS | Status: AC
  Administered 2016-01-28: 6 mg via SUBCUTANEOUS
  Filled 2016-01-28: qty 0.6

## 2016-01-28 MED ORDER — HEPARIN SOD (PORK) LOCK FLUSH 100 UNIT/ML IV SOLN
500.0000 [IU] | Freq: Once | INTRAVENOUS | Status: AC | PRN
  Administered 2016-01-28: 500 [IU]
  Filled 2016-01-28: qty 5

## 2016-02-10 NOTE — Progress Notes (Signed)
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  INTERVAL HISTORY: Patient returns to clinic today for further evaluation and consideration of cycle 5 of FOLFIRI plus Avastin.  He continues to tolerate his treatments well without significant side effects. He currently feels well and is asymptomatic.  He has no neurologic complaints. He denies any recent fevers or illnesses. He has a good appetite and denies weight loss. He has no chest pain or shortness of breath. He denies any nausea, vomiting, constipation, or diarrhea. He has no urinary complaints. Patient offers no specific complaints today.  REVIEW OF SYSTEMS:   Review of Systems  Constitutional: Negative.  Negative for fever, malaise/fatigue and weight loss.  Respiratory: Negative.  Negative for cough and shortness of breath.   Cardiovascular: Negative.  Negative for chest pain and leg swelling.  Gastrointestinal: Negative.  Negative for abdominal pain, blood in stool and melena.  Musculoskeletal: Negative.   Neurological: Negative.  Negative for weakness.  Psychiatric/Behavioral: Negative.  The patient is not nervous/anxious.     As per HPI. Otherwise, a complete review of systems is negative.  PAST MEDICAL HISTORY: Past Medical History:  Diagnosis Date  . Brain cancer ([place]) 01/2014   Chemotherapy and radiation treatment  . Colon cancer ([place]) 01/2014  . Kidney stones 1975  . Liver cancer ([place])   . Lung cancer [facility]) 2014   Radiation therapy  . Prostate cancer [facility]) 2005   treated by Dr Eliberto Ivory    PAST SURGICAL HISTORY: Past Surgical History:  Procedure Laterality Date  . COLON SURGERY  05-03-14   Resection of Sigmoid Colon and liver biopsy  . kidney stones  1975  . PORTACATH PLACEMENT  2014  . PROSTATE SURGERY  2005   partial removal  due to cancer    FAMILY HISTORY: Family History  Problem Relation Age of Onset  . Lung cancer Father   . Alzheimer's disease Mother        ADVANCED DIRECTIVES:    HEALTH MAINTENANCE: Social History  Substance Use Topics  . Smoking status: Former Smoker    Years: 35.00  . Smokeless tobacco: Never Used  . Alcohol use No     Colonoscopy:  PAP:  Bone density:  Lipid panel:  No Known Allergies  No current outpatient prescriptions on file.   No current facility-administered medications for this visit.    Facility-Administered Medications Ordered in Other Visits  Medication Dose Route Frequency Provider Last Rate Last Dose  . sodium chloride 0.9 % injection 10 mL  10 mL Intracatheter PRN [name], MD   10 mL at 07/10/14 1428    OBJECTIVE: Vitals:   02/11/16 0835  BP: 121/66  Pulse: 90  Temp: 97.2 F (36.2 C)     Body mass index is 19.4 kg/m.    ECOG FS:0 - Asymptomatic  General: Well-developed, well-nourished, no acute distress. Eyes: Pink conjunctiva, anicteric sclera. Lungs: Clear to auscultation bilaterally. Heart: Regular rate and rhythm. No rubs, murmurs, or gallops. Abdomen: Soft, nontender, nondistended. No organomegaly noted, normoactive bowel sounds. Musculoskeletal: No edema, cyanosis, or clubbing. Neuro: Alert, answering all questions appropriately. Cranial nerves grossly intact. Skin: No rashes or petechiae noted. Psych: Normal affect.    LAB RESULTS:  Lab Results  Component Value Date   NA 137 02/11/2016   K 3.6 02/11/2016   CL 107 02/11/2016  CO2 26 02/11/2016   GLUCOSE 87 02/11/2016   BUN 9 02/11/2016   CREATININE 0.89 02/11/2016   CALCIUM 9.3 02/11/2016   PROT 7.3 02/11/2016   ALBUMIN 4.0 02/11/2016   AST 20 02/11/2016   ALT 16 (L) 02/11/2016   ALKPHOS 103 02/11/2016   BILITOT 0.4 02/11/2016   GFRNONAA >60 02/11/2016   GFRAA >60 02/11/2016    Lab Results  Component Value Date   WBC 6.8 02/11/2016   NEUTROABS 3.7  02/11/2016   HGB 10.3 (L) 02/11/2016   HCT 30.5 (L) 02/11/2016   MCV 97.4 02/11/2016   PLT 126 (L) 02/11/2016   Lab Results  Component Value Date   CEA 108.7 (H) 02/11/2016     STUDIES: No results found.  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 He received carboplatinum and etoposide along with XRT completing in approximately January 2015. Patient had a recurrence in his liver in September 2015 and was reinitiated on treatment with carboplatinum and etoposide again. He was then diagnosed with adenocarcinoma of the sigmoid colon with biopsy-proven metastasis to the liver in March 2016. K-ras wild-type. Patient initiated FOLFOX and Vectibix in April 2016. Patient was found to have a complete response, therefore was switched to Vectibix maintenance therapy in February 2017. Patient was then found to have progressive disease on a PET scan in May 2017 and was subsequently switched to [facility]. PET scan on December 08, 2015 revealed progressive disease and patient started FOLFIRI on December 15, 2015.   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  PLAN:    1.  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 PET scan results from December 08, 2015 revealed progressive disease in patient's liver. On previous review of patient's records, he did not receive any irinotecan. CEA is now trending down. Proceed with cycle 5 of FOLFIRI + Avastin every 2 weeks. Return to clinic in 2 days for pump removal and Neulasta and then in 2 weeks for consideration of cycle 5.  Will dose reduce irinotecan if thrombocytopenia persists. Plan to reimage after 6 cycles with a total of 12 cycles planned.   2. Anemia: Mild, secondary treatment. Monitor. 3. Thrombocytopenia: Proceed with treatment as above. Monitor closely with  Avastin. 4. 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 Will continue to monitor with routine imaging. 5. Neutropenia: Neulasta with the remainder of cycles.   Patient expressed understanding and was in agreement with this plan. He also understands that He can call clinic at any time with any questions, concerns, or complaints.   Carcinoma of sigmoid colon [facility])   Staging form: Colon and Rectum, AJCC 7th Edition   - Clinical: Stage IVB (yT3, N1a, M1b) - Unsigned  Lloyd Huger, MD   02/13/2016 8:55 AM

## 2016-02-11 ENCOUNTER — Inpatient Hospital Stay: Payer: Medicare Other

## 2016-02-11 ENCOUNTER — Inpatient Hospital Stay (HOSPITAL_BASED_OUTPATIENT_CLINIC_OR_DEPARTMENT_OTHER): Payer: Medicare Other | Admitting: Oncology

## 2016-02-11 VITALS — BP 121/66 | HR 90 | Temp 97.2°F | Wt 151.1 lb

## 2016-02-11 DIAGNOSIS — C187 Malignant neoplasm of sigmoid colon: Secondary | ICD-10-CM

## 2016-02-11 DIAGNOSIS — Z8546 Personal history of malignant neoplasm of prostate: Secondary | ICD-10-CM

## 2016-02-11 DIAGNOSIS — Z923 Personal history of irradiation: Secondary | ICD-10-CM

## 2016-02-11 DIAGNOSIS — Z9221 Personal history of antineoplastic chemotherapy: Secondary | ICD-10-CM

## 2016-02-11 DIAGNOSIS — D696 Thrombocytopenia, unspecified: Secondary | ICD-10-CM

## 2016-02-11 DIAGNOSIS — Z85841 Personal history of malignant neoplasm of brain: Secondary | ICD-10-CM

## 2016-02-11 DIAGNOSIS — Z5111 Encounter for antineoplastic chemotherapy: Secondary | ICD-10-CM | POA: Diagnosis not present

## 2016-02-11 DIAGNOSIS — Z800 Family history of malignant neoplasm of digestive organs: Secondary | ICD-10-CM

## 2016-02-11 DIAGNOSIS — C787 Secondary malignant neoplasm of liver and intrahepatic bile duct: Secondary | ICD-10-CM | POA: Diagnosis not present

## 2016-02-11 DIAGNOSIS — D709 Neutropenia, unspecified: Secondary | ICD-10-CM | POA: Diagnosis not present

## 2016-02-11 DIAGNOSIS — Z87891 Personal history of nicotine dependence: Secondary | ICD-10-CM

## 2016-02-11 DIAGNOSIS — Z87442 Personal history of urinary calculi: Secondary | ICD-10-CM

## 2016-02-11 DIAGNOSIS — D6481 Anemia due to antineoplastic chemotherapy: Secondary | ICD-10-CM | POA: Diagnosis not present

## 2016-02-11 LAB — CBC WITH DIFFERENTIAL/PLATELET
Basophils Absolute: 0.1 10*3/uL (ref 0–0.1)
Basophils Relative: 2 %
EOS ABS: 0.1 10*3/uL (ref 0–0.7)
Eosinophils Relative: 1 %
HCT: 30.5 % — ABNORMAL LOW (ref 40.0–52.0)
Hemoglobin: 10.3 g/dL — ABNORMAL LOW (ref 13.0–18.0)
LYMPHS ABS: 2.3 10*3/uL (ref 1.0–3.6)
LYMPHS PCT: 34 %
MCH: 33 pg (ref 26.0–34.0)
MCHC: 33.9 g/dL (ref 32.0–36.0)
MCV: 97.4 fL (ref 80.0–100.0)
Monocytes Absolute: 0.6 10*3/uL (ref 0.2–1.0)
Monocytes Relative: 9 %
NEUTROS ABS: 3.7 10*3/uL (ref 1.4–6.5)
NEUTROS PCT: 54 %
Platelets: 126 10*3/uL — ABNORMAL LOW (ref 150–440)
RBC: 3.13 MIL/uL — ABNORMAL LOW (ref 4.40–5.90)
RDW: 19.8 % — AB (ref 11.5–14.5)
WBC: 6.8 10*3/uL (ref 3.8–10.6)

## 2016-02-11 LAB — COMPREHENSIVE METABOLIC PANEL
ALBUMIN: 4 g/dL (ref 3.5–5.0)
ALT: 16 U/L — ABNORMAL LOW (ref 17–63)
ANION GAP: 4 — AB (ref 5–15)
AST: 20 U/L (ref 15–41)
Alkaline Phosphatase: 103 U/L (ref 38–126)
BUN: 9 mg/dL (ref 6–20)
CO2: 26 mmol/L (ref 22–32)
Calcium: 9.3 mg/dL (ref 8.9–10.3)
Chloride: 107 mmol/L (ref 101–111)
Creatinine, Ser: 0.89 mg/dL (ref 0.61–1.24)
GFR calc Af Amer: >60 mL/min (ref >60)
GFR calc non Af Amer: >60 mL/min (ref >60)
GLUCOSE: 87 mg/dL (ref 65–99)
POTASSIUM: 3.6 mmol/L (ref 3.5–5.1)
SODIUM: 137 mmol/L (ref 135–145)
TOTAL PROTEIN: 7.3 g/dL (ref 6.5–8.1)
Total Bilirubin: 0.4 mg/dL (ref 0.3–1.2)

## 2016-02-11 LAB — PROTEIN, URINE, RANDOM: TOTAL PROTEIN, URINE: 8 mg/dL

## 2016-02-11 MED ORDER — ATROPINE SULFATE 1 MG/ML IJ SOLN
0.5000 mg | Freq: Once | INTRAMUSCULAR | Status: AC | PRN
  Administered 2016-02-11: 0.5 mg via INTRAVENOUS
  Filled 2016-02-11: qty 1

## 2016-02-11 MED ORDER — DEXAMETHASONE SODIUM PHOSPHATE 10 MG/ML IJ SOLN
10.0000 mg | Freq: Once | INTRAMUSCULAR | Status: AC
  Administered 2016-02-11: 10 mg via INTRAVENOUS
  Filled 2016-02-11: qty 1

## 2016-02-11 MED ORDER — SODIUM CHLORIDE 0.9 % IV SOLN
Freq: Once | INTRAVENOUS | Status: AC
  Administered 2016-02-11: 09:00:00 via INTRAVENOUS
  Filled 2016-02-11: qty 1000

## 2016-02-11 MED ORDER — FLUOROURACIL CHEMO INJECTION 2.5 GM/50ML
400.0000 mg/m2 | Freq: Once | INTRAVENOUS | Status: AC
  Administered 2016-02-11: 750 mg via INTRAVENOUS
  Filled 2016-02-11: qty 15

## 2016-02-11 MED ORDER — SODIUM CHLORIDE 0.9% FLUSH
10.0000 mL | INTRAVENOUS | Status: DC | PRN
  Administered 2016-02-11: 10 mL
  Filled 2016-02-11: qty 10

## 2016-02-11 MED ORDER — BEVACIZUMAB CHEMO INJECTION 400 MG/16ML
300.0000 mg | Freq: Once | INTRAVENOUS | Status: AC
  Administered 2016-02-11: 300 mg via INTRAVENOUS
  Filled 2016-02-11: qty 12

## 2016-02-11 MED ORDER — SODIUM CHLORIDE 0.9 % IV SOLN
2400.0000 mg/m2 | INTRAVENOUS | Status: DC
  Administered 2016-02-11: 4450 mg via INTRAVENOUS
  Filled 2016-02-11: qty 89

## 2016-02-11 MED ORDER — PALONOSETRON HCL INJECTION 0.25 MG/5ML
0.2500 mg | Freq: Once | INTRAVENOUS | Status: AC
  Administered 2016-02-11: 0.25 mg via INTRAVENOUS
  Filled 2016-02-11: qty 5

## 2016-02-11 MED ORDER — DEXTROSE 5 % IV SOLN
180.0000 mg/m2 | Freq: Once | INTRAVENOUS | Status: AC
  Administered 2016-02-11: 340 mg via INTRAVENOUS
  Filled 2016-02-11: qty 15

## 2016-02-11 MED ORDER — LEUCOVORIN CALCIUM INJECTION 350 MG
750.0000 mg | Freq: Once | INTRAVENOUS | Status: AC
  Administered 2016-02-11: 750 mg via INTRAVENOUS
  Filled 2016-02-11: qty 35

## 2016-02-11 MED ORDER — HEPARIN SOD (PORK) LOCK FLUSH 100 UNIT/ML IV SOLN
500.0000 [IU] | Freq: Once | INTRAVENOUS | Status: DC | PRN
  Filled 2016-02-11: qty 5

## 2016-02-11 NOTE — Progress Notes (Signed)
Patient here today for follow up on Carcinoma of sigmoid colon.  Patient states no new concerns today

## 2016-02-12 LAB — CEA: CEA: 108.7 ng/mL — AB (ref 0.0–4.7)

## 2016-02-13 ENCOUNTER — Inpatient Hospital Stay: Payer: Medicare Other

## 2016-02-13 VITALS — BP 122/68 | HR 88 | Temp 97.1°F | Resp 18

## 2016-02-13 DIAGNOSIS — C187 Malignant neoplasm of sigmoid colon: Secondary | ICD-10-CM

## 2016-02-13 DIAGNOSIS — D696 Thrombocytopenia, unspecified: Secondary | ICD-10-CM | POA: Diagnosis not present

## 2016-02-13 DIAGNOSIS — C787 Secondary malignant neoplasm of liver and intrahepatic bile duct: Secondary | ICD-10-CM | POA: Diagnosis not present

## 2016-02-13 DIAGNOSIS — Z5111 Encounter for antineoplastic chemotherapy: Secondary | ICD-10-CM | POA: Diagnosis not present

## 2016-02-13 DIAGNOSIS — D709 Neutropenia, unspecified: Secondary | ICD-10-CM | POA: Diagnosis not present

## 2016-02-13 DIAGNOSIS — D6481 Anemia due to antineoplastic chemotherapy: Secondary | ICD-10-CM | POA: Diagnosis not present

## 2016-02-13 MED ORDER — HEPARIN SOD (PORK) LOCK FLUSH 100 UNIT/ML IV SOLN
500.0000 [IU] | Freq: Once | INTRAVENOUS | Status: AC
Start: 2016-02-13 — End: 2016-02-13
  Administered 2016-02-13: 500 [IU] via INTRAVENOUS
  Filled 2016-02-13: qty 5

## 2016-02-13 MED ORDER — SODIUM CHLORIDE 0.9 % IJ SOLN
10.0000 mL | Freq: Once | INTRAMUSCULAR | Status: AC
  Administered 2016-02-13: 10 mL via INTRAVENOUS
  Filled 2016-02-13: qty 10

## 2016-02-13 MED ORDER — PEGFILGRASTIM INJECTION 6 MG/0.6ML ~~LOC~~
6.0000 mg | PREFILLED_SYRINGE | Freq: Once | SUBCUTANEOUS | Status: AC
  Administered 2016-02-13: 6 mg via SUBCUTANEOUS
  Filled 2016-02-13: qty 0.6

## 2016-02-23 NOTE — Progress Notes (Signed)
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  INTERVAL HISTORY: Patient returns to clinic today for further evaluation and consideration of cycle 6 of 12 of FOLFIRI plus Avastin.  He continues to tolerate his treatments well without significant side effects. He currently feels well and is asymptomatic.  He reports very slight constant tingling in all his fingertips, not interfering with daily activities.  He has no other neurologic complaints. He denies any recent fevers or illnesses. He has a good appetite and denies weight loss. He reports shortness of breath only when lifting things. He denies chest pain, headache, or any other pain complaint . He reports occasional constipation, relieved with OTC Ex-lax. He denies any nausea, vomiting, or diarrhea. He has no urinary complaints. Patient reports difficulty falling asleep at night and resting during the day. Patient offers no other specific complaints today.  REVIEW OF SYSTEMS:   Review of Systems  Constitutional: Negative.  Negative for fever, malaise/fatigue and weight loss.  HENT: Negative for congestion and sore throat.   Eyes: Negative for blurred vision.  Respiratory: Positive for shortness of breath. Negative for cough.   Cardiovascular: Negative.  Negative for chest pain and leg swelling.  Gastrointestinal: Positive for constipation. Negative for abdominal pain, blood in stool, diarrhea, melena, nausea and vomiting.  Genitourinary: Negative for frequency and urgency.  Musculoskeletal: Negative.   Neurological: Positive for tingling. Negative for dizziness, tremors, weakness and headaches.  Psychiatric/Behavioral: The patient has insomnia. The patient is not nervous/anxious.     As per HPI. Otherwise, a complete review of systems is  negative.  PAST MEDICAL HISTORY: Past Medical History:  Diagnosis Date  . Brain cancer ([place]) 01/2014   Chemotherapy and radiation treatment  . Colon cancer ([place]) 01/2014  . Kidney stones 1975  . Liver cancer ([place])   . Lung cancer [facility]) 2014   Radiation therapy  . Prostate cancer [facility]) 2005   treated by Dr Eliberto Ivory    PAST SURGICAL HISTORY: Past Surgical History:  Procedure Laterality Date  . COLON SURGERY  05-03-14   Resection of Sigmoid Colon and liver biopsy  . kidney stones  1975  . PORTACATH PLACEMENT  2014  . PROSTATE SURGERY  2005   partial removal due to cancer    FAMILY HISTORY: Family History  Problem Relation Age of Onset  . Lung cancer Father   . Alzheimer's disease Mother        ADVANCED DIRECTIVES:    HEALTH MAINTENANCE: Social History  Substance Use Topics  . Smoking status: Former Smoker    Years: 35.00  . Smokeless tobacco: Never Used  . Alcohol use No     Colonoscopy:  PAP:  Bone density:  Lipid panel:  No Known Allergies  No current outpatient prescriptions on file.   No current facility-administered medications for this visit.    Facility-Administered Medications Ordered in Other Visits  Medication Dose Route Frequency Provider Last Rate Last Dose  . sodium chloride 0.9 % injection 10 mL  10 mL Intracatheter PRN [name], MD   10 mL at 07/10/14 1428    OBJECTIVE: Vitals:   02/25/16 0847  BP: 121/76  Pulse: 88  Resp: 18  Temp: 97.6 F (36.4 C)     Body mass index is 19.59 kg/m.    ECOG FS:0 - Asymptomatic  General: Well-developed, well-nourished,  no acute distress. Eyes: Pink conjunctiva, anicteric sclera. Lungs: Clear to auscultation bilaterally. Heart: Regular rate and rhythm. No rubs, murmurs, or gallops. Abdomen: Soft, nontender, nondistended. No organomegaly noted, normoactive bowel sounds. Musculoskeletal: No edema, cyanosis, or clubbing. Neuro: Alert, answering all questions appropriately. Cranial nerves grossly  intact. Skin: No rashes or petechiae noted. Psych: Normal affect.    LAB RESULTS:  Lab Results  Component Value Date   NA 137 02/25/2016   K 3.9 02/25/2016   CL 106 02/25/2016   CO2 24 02/25/2016   GLUCOSE 80 02/25/2016   BUN 9 02/25/2016   CREATININE 0.85 02/25/2016   CALCIUM 9.3 02/25/2016   PROT 7.4 02/25/2016   ALBUMIN 4.0 02/25/2016   AST 23 02/25/2016   ALT 17 02/25/2016   ALKPHOS 113 02/25/2016   BILITOT 0.5 02/25/2016   GFRNONAA >60 02/25/2016   GFRAA >60 02/25/2016    Lab Results  Component Value Date   WBC 9.8 02/25/2016   NEUTROABS 6.1 02/25/2016   HGB 10.6 (L) 02/25/2016   HCT 31.1 (L) 02/25/2016   MCV 97.1 02/25/2016   PLT 165 02/25/2016   Lab Results  Component Value Date   CEA 83.6 (H) 02/25/2016     STUDIES: No results found.  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 He received carboplatinum and etoposide along with XRT completing in approximately January 2015. Patient had a recurrence in his liver in September 2015 and was reinitiated on treatment with carboplatinum and etoposide again. He was then diagnosed with adenocarcinoma of the sigmoid colon with biopsy-proven metastasis to the liver in March 2016. K-ras wild-type. Patient initiated FOLFOX and Vectibix in April 2016. Patient was found to have a complete response, therefore was switched to Vectibix maintenance therapy in February 2017. Patient was then found to have progressive disease on a PET scan in May 2017 and was subsequently switched to [facility]. PET scan on December 08, 2015 revealed progressive disease and patient started FOLFIRI on December 15, 2015.   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  PLAN:    1.  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 PET scan results from December 08, 2015 revealed progressive disease in patient's liver. On previous review of patient's records, he did not receive any irinotecan. CEA continues to trend down. Proceed with cycle 6 of FOLFIRI + Avastin every 2 weeks. Return to clinic in 2 days for pump removal and Neulasta and then in 2 weeks for labs and consideration of cycle 7.  Will dose reduce irinotecan if thrombocytopenic, currently within normal limits. Restage with CT chest/abdomen/pelvis 1-2 days prior to next visit.  Total of 12 cycles planned.   2. Anemia: Mild, secondary treatment. Monitor. 3. Thrombocytopenia: Currently within normal limits. 4. 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 Will continue to monitor with routine imaging. 5. Neutropenia: Currently within normal limits. Neulasta with the remainder of cycles. 6. Insomia: Discussed 4-7-8 breathing technique at bedtime: breathe in to count of 4, hold breath for count of 7, exhale for count of 8; do 3-5 times for letting go of overactive thoughts.  Also discussed sleep hygiene: dark bedroom, discontinuing screen (phone, tablet, Tv) usage 1-2 hours prior to bedtime, not eating or drinking 2 hours prior to bedtime, and limiting daytime naps to 20-45 minutes.  Discussed  breath ratio chart for energizing breaths during the day.  Patient expressed understanding and was in agreement with this plan. He also understands that He can call clinic at any time with any questions, concerns, or complaints.    Carcinoma of sigmoid colon [facility])   Staging form: Colon and Rectum, AJCC 7th Edition   - Clinical stage from 02/27/2016: Stage IVB (yT3, N1a, M1b) - Signed by Lloyd Huger, MD on 02/27/2016  Lloyd Huger, MD   02/27/2016 8:51 AM

## 2016-02-25 ENCOUNTER — Inpatient Hospital Stay (HOSPITAL_BASED_OUTPATIENT_CLINIC_OR_DEPARTMENT_OTHER): Payer: Medicare Other | Admitting: Oncology

## 2016-02-25 ENCOUNTER — Inpatient Hospital Stay: Payer: Medicare Other

## 2016-02-25 ENCOUNTER — Inpatient Hospital Stay: Payer: Medicare Other | Attending: Oncology

## 2016-02-25 VITALS — BP 121/76 | HR 88 | Temp 97.6°F | Resp 18 | Wt 152.6 lb

## 2016-02-25 DIAGNOSIS — C187 Malignant neoplasm of sigmoid colon: Secondary | ICD-10-CM | POA: Diagnosis not present

## 2016-02-25 DIAGNOSIS — Z8546 Personal history of malignant neoplasm of prostate: Secondary | ICD-10-CM

## 2016-02-25 DIAGNOSIS — D6481 Anemia due to antineoplastic chemotherapy: Secondary | ICD-10-CM | POA: Diagnosis not present

## 2016-02-25 DIAGNOSIS — Z923 Personal history of irradiation: Secondary | ICD-10-CM | POA: Insufficient documentation

## 2016-02-25 DIAGNOSIS — R202 Paresthesia of skin: Secondary | ICD-10-CM | POA: Insufficient documentation

## 2016-02-25 DIAGNOSIS — R0602 Shortness of breath: Secondary | ICD-10-CM | POA: Insufficient documentation

## 2016-02-25 DIAGNOSIS — D696 Thrombocytopenia, unspecified: Secondary | ICD-10-CM | POA: Diagnosis not present

## 2016-02-25 DIAGNOSIS — K5900 Constipation, unspecified: Secondary | ICD-10-CM | POA: Insufficient documentation

## 2016-02-25 DIAGNOSIS — Z87442 Personal history of urinary calculi: Secondary | ICD-10-CM | POA: Diagnosis not present

## 2016-02-25 DIAGNOSIS — Z85841 Personal history of malignant neoplasm of brain: Secondary | ICD-10-CM | POA: Insufficient documentation

## 2016-02-25 DIAGNOSIS — C7931 Secondary malignant neoplasm of brain: Secondary | ICD-10-CM

## 2016-02-25 DIAGNOSIS — Z7689 Persons encountering health services in other specified circumstances: Secondary | ICD-10-CM | POA: Insufficient documentation

## 2016-02-25 DIAGNOSIS — Z5111 Encounter for antineoplastic chemotherapy: Secondary | ICD-10-CM | POA: Insufficient documentation

## 2016-02-25 DIAGNOSIS — R200 Anesthesia of skin: Secondary | ICD-10-CM

## 2016-02-25 DIAGNOSIS — C787 Secondary malignant neoplasm of liver and intrahepatic bile duct: Secondary | ICD-10-CM | POA: Diagnosis not present

## 2016-02-25 DIAGNOSIS — Z9221 Personal history of antineoplastic chemotherapy: Secondary | ICD-10-CM | POA: Diagnosis not present

## 2016-02-25 DIAGNOSIS — Z85118 Personal history of other malignant neoplasm of bronchus and lung: Secondary | ICD-10-CM

## 2016-02-25 DIAGNOSIS — G4700 Insomnia, unspecified: Secondary | ICD-10-CM | POA: Insufficient documentation

## 2016-02-25 DIAGNOSIS — Z87891 Personal history of nicotine dependence: Secondary | ICD-10-CM

## 2016-02-25 DIAGNOSIS — Z800 Family history of malignant neoplasm of digestive organs: Secondary | ICD-10-CM

## 2016-02-25 LAB — CBC WITH DIFFERENTIAL/PLATELET
Basophils Absolute: 0 K/uL (ref 0–0.1)
Basophils Relative: 0 %
Eosinophils Absolute: 0.1 K/uL (ref 0–0.7)
Eosinophils Relative: 1 %
HCT: 31.1 % — ABNORMAL LOW (ref 40.0–52.0)
Hemoglobin: 10.6 g/dL — ABNORMAL LOW (ref 13.0–18.0)
Lymphocytes Relative: 26 %
Lymphs Abs: 2.5 K/uL (ref 1.0–3.6)
MCH: 33.2 pg (ref 26.0–34.0)
MCHC: 34.2 g/dL (ref 32.0–36.0)
MCV: 97.1 fL (ref 80.0–100.0)
Monocytes Absolute: 1 K/uL (ref 0.2–1.0)
Monocytes Relative: 11 %
Neutro Abs: 6.1 K/uL (ref 1.4–6.5)
Neutrophils Relative %: 62 %
Platelets: 165 K/uL (ref 150–440)
RBC: 3.2 MIL/uL — ABNORMAL LOW (ref 4.40–5.90)
RDW: 19.4 % — ABNORMAL HIGH (ref 11.5–14.5)
WBC: 9.8 K/uL (ref 3.8–10.6)

## 2016-02-25 LAB — PROTEIN, URINE, RANDOM: TOTAL PROTEIN, URINE: 11 mg/dL

## 2016-02-25 LAB — COMPREHENSIVE METABOLIC PANEL
ALK PHOS: 113 U/L (ref 38–126)
ALT: 17 U/L (ref 17–63)
AST: 23 U/L (ref 15–41)
Albumin: 4 g/dL (ref 3.5–5.0)
Anion gap: 7 (ref 5–15)
BILIRUBIN TOTAL: 0.5 mg/dL (ref 0.3–1.2)
BUN: 9 mg/dL (ref 6–20)
CALCIUM: 9.3 mg/dL (ref 8.9–10.3)
CO2: 24 mmol/L (ref 22–32)
Chloride: 106 mmol/L (ref 101–111)
Creatinine, Ser: 0.85 mg/dL (ref 0.61–1.24)
GFR calc Af Amer: >60 mL/min (ref >60)
GFR calc non Af Amer: >60 mL/min (ref >60)
GLUCOSE: 80 mg/dL (ref 65–99)
Potassium: 3.9 mmol/L (ref 3.5–5.1)
Sodium: 137 mmol/L (ref 135–145)
TOTAL PROTEIN: 7.4 g/dL (ref 6.5–8.1)

## 2016-02-25 MED ORDER — SODIUM CHLORIDE 0.9 % IJ SOLN
10.0000 mL | Freq: Once | INTRAMUSCULAR | Status: AC
  Administered 2016-02-25: 10 mL via INTRAVENOUS
  Filled 2016-02-25: qty 10

## 2016-02-25 MED ORDER — LEUCOVORIN CALCIUM INJECTION 350 MG
750.0000 mg | Freq: Once | INTRAVENOUS | Status: AC
  Administered 2016-02-25: 750 mg via INTRAVENOUS
  Filled 2016-02-25: qty 37.5

## 2016-02-25 MED ORDER — PALONOSETRON HCL INJECTION 0.25 MG/5ML
0.2500 mg | Freq: Once | INTRAVENOUS | Status: AC
  Administered 2016-02-25: 0.25 mg via INTRAVENOUS
  Filled 2016-02-25: qty 5

## 2016-02-25 MED ORDER — IRINOTECAN HCL CHEMO INJECTION 100 MG/5ML
180.0000 mg/m2 | Freq: Once | INTRAVENOUS | Status: AC
  Administered 2016-02-25: 340 mg via INTRAVENOUS
  Filled 2016-02-25: qty 2

## 2016-02-25 MED ORDER — SODIUM CHLORIDE 0.9 % IV SOLN
2400.0000 mg/m2 | INTRAVENOUS | Status: DC
  Administered 2016-02-25: 4450 mg via INTRAVENOUS
  Filled 2016-02-25: qty 89

## 2016-02-25 MED ORDER — SODIUM CHLORIDE 0.9 % IV SOLN: 10.0000 mg | Freq: Once | INTRAVENOUS | Status: DC

## 2016-02-25 MED ORDER — DEXAMETHASONE SODIUM PHOSPHATE 10 MG/ML IJ SOLN
10.0000 mg | Freq: Once | INTRAMUSCULAR | Status: AC
  Filled 2016-02-25: qty 1

## 2016-02-25 MED ORDER — LIDOCAINE-PRILOCAINE 2.5-2.5 % EX CREA: TOPICAL_CREAM | Freq: Once | CUTANEOUS | Status: DC

## 2016-02-25 MED ORDER — ATROPINE SULFATE 1 MG/ML IJ SOLN
0.5000 mg | Freq: Once | INTRAMUSCULAR | Status: AC | PRN
  Administered 2016-02-25: 0.5 mg via INTRAVENOUS
  Filled 2016-02-25: qty 1

## 2016-02-25 MED ORDER — DEXAMETHASONE SODIUM PHOSPHATE 10 MG/ML IJ SOLN
10.0000 mg | Freq: Once | INTRAMUSCULAR | Status: AC
  Administered 2016-02-25: 10 mg via INTRAVENOUS

## 2016-02-25 MED ORDER — FLUOROURACIL CHEMO INJECTION 2.5 GM/50ML
400.0000 mg/m2 | Freq: Once | INTRAVENOUS | Status: AC
  Administered 2016-02-25: 750 mg via INTRAVENOUS
  Filled 2016-02-25: qty 15

## 2016-02-25 MED ORDER — HEPARIN SOD (PORK) LOCK FLUSH 100 UNIT/ML IV SOLN: 500.0000 [IU] | Freq: Once | INTRAVENOUS | Status: DC

## 2016-02-25 MED ORDER — SODIUM CHLORIDE 0.9 % IV SOLN
300.0000 mg | Freq: Once | INTRAVENOUS | Status: AC
  Administered 2016-02-25: 300 mg via INTRAVENOUS
  Filled 2016-02-25: qty 12

## 2016-02-25 MED ORDER — SODIUM CHLORIDE 0.9 % IV SOLN
Freq: Once | INTRAVENOUS | Status: AC
  Administered 2016-02-25: 10:00:00 via INTRAVENOUS
  Filled 2016-02-25: qty 1000

## 2016-02-25 NOTE — Progress Notes (Signed)
Patient is here for follow up, he is doing well no complaints

## 2016-02-26 LAB — CEA: CEA: 83.6 ng/mL — AB (ref 0.0–4.7)

## 2016-02-27 ENCOUNTER — Inpatient Hospital Stay: Payer: Medicare Other

## 2016-02-27 DIAGNOSIS — Z5111 Encounter for antineoplastic chemotherapy: Secondary | ICD-10-CM | POA: Diagnosis not present

## 2016-02-27 DIAGNOSIS — Z7689 Persons encountering health services in other specified circumstances: Secondary | ICD-10-CM | POA: Diagnosis not present

## 2016-02-27 DIAGNOSIS — D6481 Anemia due to antineoplastic chemotherapy: Secondary | ICD-10-CM | POA: Diagnosis not present

## 2016-02-27 DIAGNOSIS — C187 Malignant neoplasm of sigmoid colon: Secondary | ICD-10-CM

## 2016-02-27 DIAGNOSIS — D696 Thrombocytopenia, unspecified: Secondary | ICD-10-CM | POA: Diagnosis not present

## 2016-02-27 DIAGNOSIS — C787 Secondary malignant neoplasm of liver and intrahepatic bile duct: Secondary | ICD-10-CM | POA: Diagnosis not present

## 2016-02-27 MED ORDER — SODIUM CHLORIDE 0.9% FLUSH
10.0000 mL | INTRAVENOUS | Status: DC | PRN
  Administered 2016-02-27: 10 mL
  Filled 2016-02-27: qty 10

## 2016-02-27 MED ORDER — HEPARIN SOD (PORK) LOCK FLUSH 100 UNIT/ML IV SOLN
500.0000 [IU] | Freq: Once | INTRAVENOUS | Status: AC | PRN
  Administered 2016-02-27: 500 [IU]
  Filled 2016-02-27: qty 5

## 2016-02-27 MED ORDER — PEGFILGRASTIM INJECTION 6 MG/0.6ML ~~LOC~~
6.0000 mg | PREFILLED_SYRINGE | Freq: Once | SUBCUTANEOUS | Status: AC
  Administered 2016-02-27: 6 mg via SUBCUTANEOUS
  Filled 2016-02-27: qty 0.6

## 2016-03-09 ENCOUNTER — Other Ambulatory Visit: Payer: Self-pay | Admitting: Oncology

## 2016-03-09 NOTE — Progress Notes (Signed)
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  INTERVAL HISTORY: Patient returns to clinic today for further evaluation and consideration of cycle 7 of 12 of FOLFIRI plus Avastin.  He continues to tolerate his treatments well without significant side effects. He currently feels well and is asymptomatic. He has no neurologic complaints. He denies any recent fevers or illnesses. He has a good appetite and denies weight loss. He reports mild dyspnea on exertion. He denies any chest pain.  He reports occasional constipation, relieved with OTC Ex-lax. He denies any nausea, vomiting, or diarrhea. He has no urinary complaints. Patient offers no other specific complaints today.  REVIEW OF SYSTEMS:   Review of Systems  Constitutional: Negative.  Negative for fever, malaise/fatigue and weight loss.  HENT: Negative for congestion and sore throat.   Eyes: Negative for blurred vision.  Respiratory: Positive for shortness of breath. Negative for cough.   Cardiovascular: Negative.  Negative for chest pain and leg swelling.  Gastrointestinal: Positive for constipation. Negative for abdominal pain, blood in stool, diarrhea, melena, nausea and vomiting.  Genitourinary: Negative for frequency and urgency.  Musculoskeletal: Negative.   Neurological: Positive for tingling. Negative for dizziness, tremors, weakness and headaches.  Psychiatric/Behavioral: The patient has insomnia. The patient is not nervous/anxious.     As per HPI. Otherwise, a complete review of systems is negative.  PAST MEDICAL HISTORY: Past Medical History:  Diagnosis Date  . Brain cancer ([place]) 01/2014   Chemotherapy and radiation treatment  . Colon cancer ([place]) 01/2014  . Kidney stones 1975  . Liver cancer ([place])   . Lung cancer [facility]) 2014   Radiation therapy  . Prostate cancer [facility]) 2005   treated by Dr Eliberto Ivory    PAST SURGICAL HISTORY: Past Surgical History:  Procedure Laterality Date  . COLON SURGERY  05-03-14   Resection of Sigmoid Colon and liver biopsy  . kidney stones  1975  . PORTACATH PLACEMENT  2014  . PROSTATE SURGERY  2005   partial removal due to cancer    FAMILY HISTORY: Family History  Problem Relation Age of Onset  . Lung cancer Father   . Alzheimer's disease Mother        ADVANCED DIRECTIVES:    HEALTH MAINTENANCE: Social History  Substance Use Topics  . Smoking status: Former Smoker    Years: 35.00  . Smokeless tobacco: Never Used  . Alcohol use No     Colonoscopy:  PAP:  Bone density:  Lipid panel:  No Known Allergies  No current outpatient prescriptions on file.   No current facility-administered medications for this visit.    Facility-Administered Medications Ordered in Other Visits  Medication Dose Route Frequency Provider Last Rate Last Dose  . sodium chloride 0.9 % injection 10 mL  10 mL Intracatheter PRN [name], MD   10 mL at 07/10/14 1428    OBJECTIVE: Vitals:   03/10/16 0933  BP: 114/75  Pulse: 99  Resp: 18  Temp: (!) 96.5 F (35.8 C)     Body mass index is 19.38 kg/m.    ECOG FS:0 - Asymptomatic  General: Well-developed, well-nourished, no acute distress. Eyes: Pink conjunctiva, anicteric sclera. Lungs: Clear to auscultation bilaterally. Heart: Regular rate and rhythm. No rubs, murmurs, or gallops. Abdomen: Soft, nontender, nondistended. No organomegaly noted, normoactive bowel sounds. Musculoskeletal: No edema, cyanosis, or  clubbing. Neuro: Alert, answering all questions appropriately. Cranial nerves grossly intact. Skin: No rashes or petechiae noted. Psych: Normal affect.    LAB RESULTS:  Lab Results  Component Value Date   NA 137 02/25/2016   K 3.9 02/25/2016   CL 106 02/25/2016   CO2 24 02/25/2016   GLUCOSE 80 02/25/2016   BUN 9  02/25/2016   CREATININE 0.85 02/25/2016   CALCIUM 9.3 02/25/2016   PROT 7.4 02/25/2016   ALBUMIN 4.0 02/25/2016   AST 23 02/25/2016   ALT 17 02/25/2016   ALKPHOS 113 02/25/2016   BILITOT 0.5 02/25/2016   GFRNONAA >60 02/25/2016   GFRAA >60 02/25/2016    Lab Results  Component Value Date   WBC 12.6 (H) 03/10/2016   NEUTROABS 8.3 (H) 03/10/2016   HGB 11.2 (L) 03/10/2016   HCT 33.2 (L) 03/10/2016   MCV 96.5 03/10/2016   PLT 159 03/10/2016   Lab Results  Component Value Date   CEA 83.6 (H) 02/25/2016     STUDIES: No results found.  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 He received carboplatinum and etoposide along with XRT completing in approximately January 2015. Patient had a recurrence in his liver in September 2015 and was reinitiated on treatment with carboplatinum and etoposide again. He was then diagnosed with adenocarcinoma of the sigmoid colon with biopsy-proven metastasis to the liver in March 2016. K-ras wild-type. Patient initiated FOLFOX and Vectibix in April 2016. Patient was found to have a complete response, therefore was switched to Vectibix maintenance therapy in February 2017. Patient was then found to have progressive disease on a PET scan in May 2017 and was subsequently switched to [facility]. PET scan on December 08, 2015 revealed progressive disease and patient started FOLFIRI on December 15, 2015.   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  PLAN:    1.  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 PET scan results from December 08, 2015 revealed progressive disease in patient's liver. On previous review of patient's records, he did not receive any irinotecan. CEA continues to trend down. Proceed with cycle 7 of FOLFIRI + Avastin every 2 weeks. Return to clinic  in 2 days for pump removal and Neulasta and then in 2 weeks for labs and consideration of cycle 8.  Will dose reduce irinotecan if thrombocytopenic, currently within normal limits. Restage with CT chest/abdomen/pelvis 1-2 days prior to next visit.  Total of 12 cycles planned.   2. Anemia: Mild, secondary treatment. Monitor. 3. Thrombocytopenia: Currently within normal limits. 4. 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 Will continue to monitor with routine imaging. 5. Neutropenia: Currently within normal limits. Neulasta with the remainder of cycles. 6. Insomia: Continue breathing technique as discussed with nurse practitioner.  Patient expressed understanding and was in agreement with this plan. He also understands that He can call clinic at any time with any questions, concerns, or complaints.    Cancer Staging Carcinoma of sigmoid colon [facility]) Staging form: Colon and Rectum, AJCC 7th Edition - Clinical stage from 02/27/2016: Stage IVB (yT3, N1a, M1b) - Signed by Lloyd Huger, MD on 02/27/2016   Lloyd Huger, MD   03/10/2016 9:35 AM

## 2016-03-10 ENCOUNTER — Inpatient Hospital Stay: Payer: Medicare Other

## 2016-03-10 ENCOUNTER — Inpatient Hospital Stay (HOSPITAL_BASED_OUTPATIENT_CLINIC_OR_DEPARTMENT_OTHER): Payer: Medicare Other | Admitting: Oncology

## 2016-03-10 VITALS — BP 114/75 | HR 99 | Temp 96.5°F | Resp 18 | Wt 150.9 lb

## 2016-03-10 DIAGNOSIS — Z85841 Personal history of malignant neoplasm of brain: Secondary | ICD-10-CM

## 2016-03-10 DIAGNOSIS — Z9221 Personal history of antineoplastic chemotherapy: Secondary | ICD-10-CM

## 2016-03-10 DIAGNOSIS — K5900 Constipation, unspecified: Secondary | ICD-10-CM

## 2016-03-10 DIAGNOSIS — D6481 Anemia due to antineoplastic chemotherapy: Secondary | ICD-10-CM

## 2016-03-10 DIAGNOSIS — D696 Thrombocytopenia, unspecified: Secondary | ICD-10-CM

## 2016-03-10 DIAGNOSIS — Z8546 Personal history of malignant neoplasm of prostate: Secondary | ICD-10-CM

## 2016-03-10 DIAGNOSIS — Z87442 Personal history of urinary calculi: Secondary | ICD-10-CM

## 2016-03-10 DIAGNOSIS — C787 Secondary malignant neoplasm of liver and intrahepatic bile duct: Secondary | ICD-10-CM | POA: Diagnosis not present

## 2016-03-10 DIAGNOSIS — R0602 Shortness of breath: Secondary | ICD-10-CM

## 2016-03-10 DIAGNOSIS — C187 Malignant neoplasm of sigmoid colon: Secondary | ICD-10-CM

## 2016-03-10 DIAGNOSIS — G4700 Insomnia, unspecified: Secondary | ICD-10-CM

## 2016-03-10 DIAGNOSIS — Z923 Personal history of irradiation: Secondary | ICD-10-CM

## 2016-03-10 DIAGNOSIS — R202 Paresthesia of skin: Secondary | ICD-10-CM

## 2016-03-10 DIAGNOSIS — Z5111 Encounter for antineoplastic chemotherapy: Secondary | ICD-10-CM | POA: Diagnosis not present

## 2016-03-10 DIAGNOSIS — Z7689 Persons encountering health services in other specified circumstances: Secondary | ICD-10-CM | POA: Diagnosis not present

## 2016-03-10 LAB — COMPREHENSIVE METABOLIC PANEL
ALT: 13 U/L — ABNORMAL LOW (ref 17–63)
ANION GAP: 6 (ref 5–15)
AST: 24 U/L (ref 15–41)
Albumin: 4.3 g/dL (ref 3.5–5.0)
Alkaline Phosphatase: 129 U/L — ABNORMAL HIGH (ref 38–126)
BUN: 8 mg/dL (ref 6–20)
CHLORIDE: 105 mmol/L (ref 101–111)
CO2: 26 mmol/L (ref 22–32)
Calcium: 9.4 mg/dL (ref 8.9–10.3)
Creatinine, Ser: 0.93 mg/dL (ref 0.61–1.24)
GFR calc non Af Amer: >60 mL/min (ref >60)
Glucose, Bld: 111 mg/dL — ABNORMAL HIGH (ref 65–99)
POTASSIUM: 3.6 mmol/L (ref 3.5–5.1)
SODIUM: 137 mmol/L (ref 135–145)
Total Bilirubin: 0.4 mg/dL (ref 0.3–1.2)
Total Protein: 7.6 g/dL (ref 6.5–8.1)

## 2016-03-10 LAB — CBC WITH DIFFERENTIAL/PLATELET
Basophils Absolute: 0 10*3/uL (ref 0–0.1)
Basophils Relative: 0 %
EOS ABS: 0.1 10*3/uL (ref 0–0.7)
EOS PCT: 1 %
HCT: 33.2 % — ABNORMAL LOW (ref 40.0–52.0)
Hemoglobin: 11.2 g/dL — ABNORMAL LOW (ref 13.0–18.0)
LYMPHS ABS: 3.2 10*3/uL (ref 1.0–3.6)
Lymphocytes Relative: 25 %
MCH: 32.5 pg (ref 26.0–34.0)
MCHC: 33.6 g/dL (ref 32.0–36.0)
MCV: 96.5 fL (ref 80.0–100.0)
MONOS PCT: 7 %
Monocytes Absolute: 0.9 10*3/uL (ref 0.2–1.0)
Neutro Abs: 8.3 10*3/uL — ABNORMAL HIGH (ref 1.4–6.5)
Neutrophils Relative %: 67 %
PLATELETS: 159 10*3/uL (ref 150–440)
RBC: 3.44 MIL/uL — AB (ref 4.40–5.90)
RDW: 18.4 % — AB (ref 11.5–14.5)
WBC: 12.6 10*3/uL — AB (ref 3.8–10.6)

## 2016-03-10 LAB — PROTEIN, URINE, RANDOM

## 2016-03-10 MED ORDER — SODIUM CHLORIDE 0.9% FLUSH
10.0000 mL | INTRAVENOUS | Status: DC | PRN
  Administered 2016-03-10: 10 mL
  Filled 2016-03-10: qty 10

## 2016-03-10 MED ORDER — IRINOTECAN HCL CHEMO INJECTION 100 MG/5ML
180.0000 mg/m2 | Freq: Once | INTRAVENOUS | Status: AC
  Administered 2016-03-10: 340 mg via INTRAVENOUS
  Filled 2016-03-10: qty 15

## 2016-03-10 MED ORDER — DEXAMETHASONE SODIUM PHOSPHATE 10 MG/ML IJ SOLN
10.0000 mg | Freq: Once | INTRAMUSCULAR | Status: AC
  Administered 2016-03-10: 10 mg via INTRAVENOUS
  Filled 2016-03-10: qty 1

## 2016-03-10 MED ORDER — SODIUM CHLORIDE 0.9 % IV SOLN: 10.0000 mg | Freq: Once | INTRAVENOUS | Status: DC

## 2016-03-10 MED ORDER — ATROPINE SULFATE 1 MG/ML IJ SOLN
0.5000 mg | Freq: Once | INTRAMUSCULAR | Status: AC | PRN
  Administered 2016-03-10: 10:00:00 via INTRAVENOUS
  Filled 2016-03-10: qty 1

## 2016-03-10 MED ORDER — FLUOROURACIL CHEMO INJECTION 2.5 GM/50ML
400.0000 mg/m2 | Freq: Once | INTRAVENOUS | Status: AC
  Administered 2016-03-10: 750 mg via INTRAVENOUS
  Filled 2016-03-10: qty 15

## 2016-03-10 MED ORDER — HEPARIN SOD (PORK) LOCK FLUSH 100 UNIT/ML IV SOLN: 500.0000 [IU] | Freq: Once | INTRAVENOUS | Status: DC | PRN

## 2016-03-10 MED ORDER — PALONOSETRON HCL INJECTION 0.25 MG/5ML
0.2500 mg | Freq: Once | INTRAVENOUS | Status: AC
  Administered 2016-03-10: 0.25 mg via INTRAVENOUS
  Filled 2016-03-10: qty 5

## 2016-03-10 MED ORDER — SODIUM CHLORIDE 0.9 % IV SOLN
Freq: Once | INTRAVENOUS | Status: AC
  Administered 2016-03-10: 10:00:00 via INTRAVENOUS
  Filled 2016-03-10: qty 1000

## 2016-03-10 MED ORDER — SODIUM CHLORIDE 0.9 % IV SOLN
2400.0000 mg/m2 | INTRAVENOUS | Status: DC
  Administered 2016-03-10: 4450 mg via INTRAVENOUS
  Filled 2016-03-10: qty 89

## 2016-03-10 MED ORDER — LEUCOVORIN CALCIUM INJECTION 350 MG
750.0000 mg | Freq: Once | INTRAVENOUS | Status: AC
  Administered 2016-03-10: 750 mg via INTRAVENOUS
  Filled 2016-03-10: qty 20

## 2016-03-10 MED ORDER — SODIUM CHLORIDE 0.9 % IV SOLN
300.0000 mg | Freq: Once | INTRAVENOUS | Status: AC
  Administered 2016-03-10: 300 mg via INTRAVENOUS
  Filled 2016-03-10: qty 12

## 2016-03-11 LAB — CEA: CEA: 69.1 ng/mL — AB (ref 0.0–4.7)

## 2016-03-12 ENCOUNTER — Inpatient Hospital Stay: Payer: Medicare Other

## 2016-03-12 VITALS — BP 110/73 | HR 81 | Temp 95.0°F | Resp 20

## 2016-03-12 DIAGNOSIS — C187 Malignant neoplasm of sigmoid colon: Secondary | ICD-10-CM

## 2016-03-12 DIAGNOSIS — D696 Thrombocytopenia, unspecified: Secondary | ICD-10-CM | POA: Diagnosis not present

## 2016-03-12 DIAGNOSIS — Z7689 Persons encountering health services in other specified circumstances: Secondary | ICD-10-CM | POA: Diagnosis not present

## 2016-03-12 DIAGNOSIS — Z5111 Encounter for antineoplastic chemotherapy: Secondary | ICD-10-CM | POA: Diagnosis not present

## 2016-03-12 DIAGNOSIS — C787 Secondary malignant neoplasm of liver and intrahepatic bile duct: Secondary | ICD-10-CM | POA: Diagnosis not present

## 2016-03-12 DIAGNOSIS — D6481 Anemia due to antineoplastic chemotherapy: Secondary | ICD-10-CM | POA: Diagnosis not present

## 2016-03-12 MED ORDER — SODIUM CHLORIDE 0.9% FLUSH
10.0000 mL | INTRAVENOUS | Status: DC | PRN
  Administered 2016-03-12: 10 mL
  Filled 2016-03-12: qty 10

## 2016-03-12 MED ORDER — PEGFILGRASTIM INJECTION 6 MG/0.6ML ~~LOC~~
6.0000 mg | PREFILLED_SYRINGE | Freq: Once | SUBCUTANEOUS | Status: AC
  Administered 2016-03-12: 6 mg via SUBCUTANEOUS
  Filled 2016-03-12: qty 0.6

## 2016-03-12 MED ORDER — HEPARIN SOD (PORK) LOCK FLUSH 100 UNIT/ML IV SOLN
500.0000 [IU] | Freq: Once | INTRAVENOUS | Status: AC | PRN
  Administered 2016-03-12: 500 [IU]
  Filled 2016-03-12: qty 5

## 2016-03-19 ENCOUNTER — Encounter: Payer: Self-pay | Admitting: Emergency Medicine

## 2016-03-19 ENCOUNTER — Telehealth: Payer: Self-pay | Admitting: *Deleted

## 2016-03-19 ENCOUNTER — Emergency Department
Admission: EM | Admit: 2016-03-19 | Discharge: 2016-03-19 | Disposition: A | Discharge status: Home / Self Care | Payer: Medicare Other | Attending: Emergency Medicine | Admitting: Emergency Medicine

## 2016-03-19 DIAGNOSIS — Z85841 Personal history of malignant neoplasm of brain: Secondary | ICD-10-CM | POA: Insufficient documentation

## 2016-03-19 DIAGNOSIS — Z87891 Personal history of nicotine dependence: Secondary | ICD-10-CM | POA: Diagnosis not present

## 2016-03-19 DIAGNOSIS — K0889 Other specified disorders of teeth and supporting structures: Secondary | ICD-10-CM | POA: Diagnosis present

## 2016-03-19 DIAGNOSIS — Z85038 Personal history of other malignant neoplasm of large intestine: Secondary | ICD-10-CM | POA: Insufficient documentation

## 2016-03-19 DIAGNOSIS — K121 Other forms of stomatitis: Secondary | ICD-10-CM

## 2016-03-19 DIAGNOSIS — K120 Recurrent oral aphthae: Secondary | ICD-10-CM | POA: Insufficient documentation

## 2016-03-19 DIAGNOSIS — Z85118 Personal history of other malignant neoplasm of bronchus and lung: Secondary | ICD-10-CM | POA: Diagnosis not present

## 2016-03-19 DIAGNOSIS — Z8505 Personal history of malignant neoplasm of liver: Secondary | ICD-10-CM | POA: Insufficient documentation

## 2016-03-19 MED ORDER — MAGIC MOUTHWASH: 5.0000 mL | Freq: Four times a day (QID) | ORAL | 0 refills | Status: DC | PRN

## 2016-03-19 MED ORDER — HYDROCODONE-ACETAMINOPHEN 5-325 MG PO TABS: 1.0000 | ORAL_TABLET | Freq: Four times a day (QID) | ORAL | 0 refills | Status: DC | PRN

## 2016-03-19 MED ORDER — LIDOCAINE VISCOUS 2 % MT SOLN: 10.0000 mL | OROMUCOSAL | 0 refills | Status: DC | PRN

## 2016-03-19 NOTE — ED Triage Notes (Signed)
Pt complains of bump under tongue on right side that started 4 days ago. Pt states it is painful and he has not been able to eat or drink.

## 2016-03-19 NOTE — ED Provider Notes (Signed)
[facility] Emergency Department Provider Note  ____________________________________________  Time seen: Approximately 10:39 AM  I have reviewed the triage vital signs and the nursing notes.   HISTORY  Chief Complaint Dental Pain    HPI [name] is a 76 y.o. male , NAD, presents to the emergency department with several week history of sore under the tongue. Patient states he has noted a sore under his tongue over the last several weeks. Her last 4 days the sore has become more painful and caused him difficulty when he eats or drinks. Has had no swelling about his face or neck. Denies any injuries or traumas to the area. Has had no fevers, chills or body aches. Denies any headaches, neck pain or back pain. Has had no swelling about the lips/tongue/throat. He did call his oncologist this morning who suggested that the patient come to this emergency department for evaluation and they would follow-up with him after his evaluation today. Patient has not taken anything over-the-counter for his symptoms.    Past Medical History:  Diagnosis Date  . 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 Colon cancer ([place]) 01/2014  . Kidney stones 1975  . Liver cancer ([place])   . Lung cancer [facility]) 2014   Radiation therapy  . Prostate cancer [facility]) 2005   treated by Dr Eliberto Ivory    Patient Active Problem List   Diagnosis Date Noted  . Sepsis ([place]) 11/24/2015  . Metastasis to liver ([place]) 10/05/2015  . Carcinoma of sigmoid colon ([place]) 04/24/2014    Past Surgical History:  Procedure Laterality Date  . COLON SURGERY  05-03-14   Resection of Sigmoid Colon and liver biopsy  . kidney stones  1975  . PORTACATH PLACEMENT  2014  . PROSTATE SURGERY  2005   partial removal due to cancer    Prior to Admission medications   Medication Sig Start Date End Date Taking? Authorizing Provider  HYDROcodone-acetaminophen (NORCO) 5-325 MG tablet Take 1 tablet by mouth  every 6 (six) hours as needed for severe pain. 03/19/16   Jami L Hagler, PA-C  lidocaine (XYLOCAINE) 2 % solution Use as directed 10 mLs in the mouth or throat every 4 (four) hours as needed for mouth pain. 03/19/16   Jami L Hagler, PA-C  magic mouthwash SOLN Take 5 mLs by mouth 4 (four) times daily as needed for mouth pain. Please mix 41m of each ingredient totaling 1841m2/2/18   Jami L Hagler, PA-C    Allergies Patient has no known allergies.  Family History  Problem Relation Age of Onset  . Lung cancer Father   . Alzheimer's disease Mother     Social History Social History  Substance Use Topics  . Smoking status: Former Smoker    Years: 35.00  . Smokeless tobacco: Never Used  . Alcohol use No     Review of Systems  Constitutional: No fever/chills, Fatigue Eyes: No visual changes.  ENT: Positive sore under the tongue that is painful. No sore throat, swelling about the lips/tongue/throat. Cardiovascular: No chest pain. Respiratory:  No shortness of breath. No wheezing.  Gastrointestinal: No abdominal pain.  No nausea, vomiting.   Musculoskeletal: Negative for neck or back pain.  Skin: Negative for rash, redness, swelling, oozing, weeping, bleeding. Neurological: Negative for headaches. 10-point ROS otherwise negative.  ____________________________________________   PHYSICAL EXAM:  VITAL SIGNS: ED Triage Vitals  Enc Vitals Group     BP 03/19/16 1013 124/85  Pulse Rate 03/19/16 1013 (!) 111     Resp 03/19/16 1013 18     Temp 03/19/16 1013 98 F (36.7 C)     Temp Source 03/19/16 1013 Oral     SpO2 03/19/16 1013 100 %     Weight 03/19/16 1018 150 lb (68 kg)     Height 03/19/16 1018 '6\' 2"'$  (1.88 m)     Head Circumference --      Peak Flow --      Pain Score 03/19/16 1018 9     Pain Loc --      Pain Edu? --      Excl. in [place]? --      Constitutional: Alert and oriented. Well appearing and in no acute distress. Eyes: Conjunctivae are normal. Head:  Atraumatic. ENT:      Nose: No congestion/rhinnorhea/epistaxis.      Mouth/Throat: 0.5cm annular with central clearing abscess ulcer noted about the right underside of the tongue without erythema, oozing, weeping, bleeding or swelling. Patient has full range of motion of the tongue as well as the jaw without difficulty. Mucous membranes are moist.  Neck: No stridor. Supple with FROM Hematological/Lymphatic/Immunilogical: No cervical lymphadenopathy. Cardiovascular: Good peripheral circulation. Respiratory: Normal respiratory effort without tachypnea or retractions.  Neurologic:  Normal speech and language. No gross focal neurologic deficits are appreciated.  Skin:  Skin is warm, dry and intact. No rash noted. Psychiatric: Mood and affect are normal. Speech and behavior are normal. Patient exhibits appropriate insight and judgement.   ____________________________________________   LABS  None ____________________________________________  EKG  None ____________________________________________  RADIOLOGY  None ____________________________________________    PROCEDURES  Procedure(s) performed: None   Procedures   Medications - No data to display   ____________________________________________   INITIAL IMPRESSION / ASSESSMENT AND PLAN / ED COURSE  Pertinent labs & imaging results that were available during my care of the patient were reviewed by me and considered in my medical decision making (see chart for details).     Patient's diagnosis is consistent with oral ulcer. Patient will be discharged home with prescriptions for magic mouthwash, lidocaine viscous and norco to take as directed. Patient is to follow up with his PCP or oncologist if symptoms persist past this treatment course. Patient is given ED precautions to return to the ED for any worsening or new symptoms.   ____________________________________________  FINAL CLINICAL IMPRESSION(S) / ED  DIAGNOSES  Final diagnoses:  Oral ulcer      NEW MEDICATIONS STARTED DURING THIS VISIT:  Discharge Medication List as of 03/19/2016 10:53 AM    START taking these medications   Details  HYDROcodone-acetaminophen (NORCO) 5-325 MG tablet Take 1 tablet by mouth every 6 (six) hours as needed for severe pain., Starting Fri 03/19/2016, Print    lidocaine (XYLOCAINE) 2 % solution Use as directed 10 mLs in the mouth or throat every 4 (four) hours as needed for mouth pain., Starting Fri 03/19/2016, Print    magic mouthwash SOLN Take 5 mLs by mouth 4 (four) times daily as needed for mouth pain. Please mix 25m of each ingredient totaling 1861m Starting Fri 03/19/2016, Print             JaJudithe ModestaWildwood CrestPA-C 03/19/16 12Olive HillMD 03/19/16 1536

## 2016-03-19 NOTE — Telephone Encounter (Signed)
Pt called into answering service complaining of sore tongue. Returned call to pt's spouse. [name] stated that pt has had sore tongue that is very painful for a few days and pt could not take it anymore. At the time of phone call, pt was on his way to the hospital. Informed pt's spouse to let us know if needs anything else. Dr. Grayland Ormond has been made aware.

## 2016-03-22 ENCOUNTER — Ambulatory Visit
Admission: RE | Admit: 2016-03-22 | Discharge: 2016-03-22 | Disposition: A | Discharge status: Home / Self Care | Payer: Medicare Other | Source: Ambulatory Visit | Attending: Oncology | Admitting: Oncology

## 2016-03-22 DIAGNOSIS — I2510 Atherosclerotic heart disease of native coronary artery without angina pectoris: Secondary | ICD-10-CM | POA: Insufficient documentation

## 2016-03-22 DIAGNOSIS — R918 Other nonspecific abnormal finding of lung field: Secondary | ICD-10-CM | POA: Diagnosis not present

## 2016-03-22 DIAGNOSIS — C787 Secondary malignant neoplasm of liver and intrahepatic bile duct: Secondary | ICD-10-CM | POA: Diagnosis not present

## 2016-03-22 DIAGNOSIS — I700 Atherosclerosis of aorta: Secondary | ICD-10-CM | POA: Diagnosis not present

## 2016-03-22 DIAGNOSIS — C187 Malignant neoplasm of sigmoid colon: Secondary | ICD-10-CM | POA: Diagnosis not present

## 2016-03-22 DIAGNOSIS — C189 Malignant neoplasm of colon, unspecified: Secondary | ICD-10-CM | POA: Diagnosis not present

## 2016-03-22 DIAGNOSIS — M87851 Other osteonecrosis, right femur: Secondary | ICD-10-CM | POA: Insufficient documentation

## 2016-03-22 MED ORDER — IOPAMIDOL (ISOVUE-300) INJECTION 61%
100.0000 mL | Freq: Once | INTRAVENOUS | Status: AC | PRN
  Administered 2016-03-22: 100 mL via INTRAVENOUS

## 2016-03-23 NOTE — Progress Notes (Signed)
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  INTERVAL HISTORY: Patient returns to clinic today for further evaluation, discussion of his imaging results, and consideration of cycle 8 of 12 of FOLFIRI plus Avastin.  He continues to tolerate his treatments well without significant side effects. He was recently seen in the emergency room for mouth pain. He currently feels well and is asymptomatic. He has no neurologic complaints. He denies any recent fevers or illnesses. He has a good appetite and denies weight loss. He reports mild dyspnea on exertion. He denies any chest pain. He reports occasional constipation, relieved with OTC Ex-lax. He denies any nausea, vomiting, or diarrhea. He has no urinary complaints. Patient offers no other specific complaints today.  REVIEW OF SYSTEMS:   Review of Systems  Constitutional: Negative.  Negative for fever, malaise/fatigue and weight loss.  Respiratory: Positive for shortness of breath. Negative for cough and hemoptysis.   Cardiovascular: Negative.  Negative for chest pain and leg swelling.  Gastrointestinal: Negative.  Negative for abdominal pain, blood in stool and melena.  Musculoskeletal: Negative.   Neurological: Negative.  Negative for sensory change and weakness.  Psychiatric/Behavioral: Negative.  The patient is not nervous/anxious.     As per HPI. Otherwise, a complete review of systems is negative.  PAST MEDICAL HISTORY: Past Medical History:  Diagnosis Date  . Brain cancer ([place]) 01/2014   Chemotherapy and radiation treatment  . Colon cancer ([place]) 01/2014  . Kidney stones 1975  . Liver cancer ([place])   .  Lung cancer [facility]) 2014   Radiation therapy  . Prostate cancer [facility]) 2005   treated by Dr Eliberto Ivory    PAST SURGICAL HISTORY: Past Surgical History:  Procedure Laterality Date  . COLON SURGERY  05-03-14   Resection of Sigmoid Colon and liver biopsy  . kidney stones  1975  . PORTACATH PLACEMENT  2014  . PROSTATE SURGERY  2005   partial removal due to cancer    FAMILY HISTORY: Family History  Problem Relation Age of Onset  . Lung cancer Father   . Alzheimer's disease Mother     ADVANCED DIRECTIVES (Y/N):  N  HEALTH MAINTENANCE: Social History  Substance Use Topics  . Smoking status: Former Smoker    Years: 35.00  . Smokeless tobacco: Never Used  . Alcohol use No     Colonoscopy:  PAP:  Bone density:  Lipid panel:  No Known Allergies  Current Outpatient Prescriptions  Medication Sig Dispense Refill  . HYDROcodone-acetaminophen (NORCO) 5-325 MG tablet Take 1 tablet by mouth every 6 (six) hours as needed for severe pain. 6 tablet 0  . lidocaine (XYLOCAINE) 2 % solution Use as directed 10 mLs in the mouth or throat every 4 (four) hours as needed for mouth pain. 100 mL 0  . magic mouthwash SOLN Take 5 mLs by mouth 4 (four) times daily as needed for mouth pain. Please mix 32m of each ingredient totaling [id number] mL 0   No current facility-administered medications for this visit.    Facility-Administered Medications Ordered in Other Visits  Medication Dose Route Frequency Provider Last Rate Last Dose  . sodium chloride 0.9 % injection 10 mL  10 mL Intracatheter PRN JaDelorise Shiner  Choksi, MD   10 mL at 07/10/14 1428    OBJECTIVE: Vitals:   03/24/16 0947  BP: 124/79  Pulse: 87  Resp: 18  Temp: (!) 96.6 F (35.9 C)     Body mass index is 19.52 kg/m.    ECOG FS:1 - Symptomatic but completely ambulatory  General: Well-developed, well-nourished, no acute distress. Eyes: Pink conjunctiva, anicteric sclera. Lungs: Clear to auscultation bilaterally. Heart: Regular rate and  rhythm. No rubs, murmurs, or gallops. Abdomen: Soft, nontender, nondistended. No organomegaly noted, normoactive bowel sounds. Musculoskeletal: No edema, cyanosis, or clubbing. Neuro: Alert, answering all questions appropriately. Cranial nerves grossly intact. Skin: No rashes or petechiae noted. Psych: Normal affect.   LAB RESULTS:  Lab Results  Component Value Date   NA 137 03/24/2016   K 3.7 03/24/2016   CL 104 03/24/2016   CO2 28 03/24/2016   GLUCOSE 99 03/24/2016   BUN 7 03/24/2016   CREATININE 0.77 03/24/2016   CALCIUM 9.1 03/24/2016   PROT 7.2 03/24/2016   ALBUMIN 4.1 03/24/2016   AST 22 03/24/2016   ALT 11 (L) 03/24/2016   ALKPHOS 109 03/24/2016   BILITOT 0.4 03/24/2016   GFRNONAA >60 03/24/2016   GFRAA >60 03/24/2016    Lab Results  Component Value Date   WBC 9.4 03/24/2016   NEUTROABS 6.0 03/24/2016   HGB 10.5 (L) 03/24/2016   HCT 30.6 (L) 03/24/2016   MCV 96.9 03/24/2016   PLT 142 (L) 03/24/2016   Lab Results  Component Value Date   CEA 69.1 (H) 03/10/2016     STUDIES: Ct Chest W Contrast  Result Date: 03/22/2016 CLINICAL DATA:  Restaging colon cancer.  Prostate cancer. EXAM: CT CHEST, ABDOMEN, AND PELVIS WITH CONTRAST TECHNIQUE: Multidetector CT imaging of the chest, abdomen and pelvis was performed following the standard protocol during bolus administration of intravenous contrast. CONTRAST:  13m ISOVUE-300 IOPAMIDOL (ISOVUE-300) INJECTION 61% COMPARISON:  PET 12/08/2015, CT chest 11/23/2015 and CT abdomen 03/19/2015. FINDINGS: CT CHEST FINDINGS Cardiovascular: Right IJ Port-A-Cath terminates in the SVC. Coronary artery calcification. Heart size normal. No pericardial effusion. Mediastinum/Nodes: No pathologically enlarged mediastinal, hilar or axillary lymph nodes. Esophagus is grossly unremarkable. Lungs/Pleura: Heterogeneous collapse/consolidation in the O posterior upper right hemithorax is seen with associated bronchiectasis, stable. 10 mm nodule in  the posterior left upper lobe is stable. Subpleural nodularity in the posterior left lower lobe measures 7 mm, also stable. No pleural fluid. Airway is otherwise unremarkable. Musculoskeletal: No worrisome lytic or sclerotic lesions. Degenerative changes are seen in the spine. CT ABDOMEN PELVIS FINDINGS Hepatobiliary: Partially calcified lesions in the liver measure up to 5.4 cm in the dome, likely stable, given slight differences in technique. Well-circumscribed low-attenuation lesions in the liver measure up to 1.4 cm, similar. Gallbladder is unremarkable. No biliary ductal dilatation. Pancreas: Negative. Spleen: Negative. Adrenals/Urinary Tract: Adrenal glands are unremarkable. Low-attenuation lesions in the kidneys measure up to 6.5 cm on the left and are likely cysts. Ureters are decompressed. Bladder is grossly unremarkable. Stomach/Bowel: Stomach, small bowel, appendix and majority of the colon are unremarkable. Postoperative changes are seen in the sigmoid. Vascular/Lymphatic: Atherosclerotic calcification of the arterial vasculature without abdominal aortic aneurysm. No pathologically enlarged lymph nodes. Reproductive: Prostate is visualized. Other: Tiny periumbilical hernia contains fat. No free fluid. Mesenteries and peritoneum are otherwise unremarkable. Musculoskeletal: No worrisome lytic or sclerotic lesions. Changes of avascular necrosis are seen in the right femoral head, without cortical collapse. Degenerative disc disease at L2-3. IMPRESSION: 1. Hepatic metastatic disease is grossly stable.  2. Posttreatment changes in the right hemithorax. Nodular lesions in the left hemithorax were not shown to be hypermetabolic on 05/39/7673. Continued attention on followup exams is warranted. 3. Aortic atherosclerosis (ICD10-170.0). Coronary artery calcification. 4. 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 Electronically Signed   By: Lorin Picket M.D.   On: 03/22/2016 11:54   Ct Abdomen Pelvis W  Contrast  Result Date: 03/22/2016 CLINICAL DATA:  Restaging colon cancer.  Prostate cancer. EXAM: CT CHEST, ABDOMEN, AND PELVIS WITH CONTRAST TECHNIQUE: Multidetector CT imaging of the chest, abdomen and pelvis was performed following the standard protocol during bolus administration of intravenous contrast. CONTRAST:  149m ISOVUE-300 IOPAMIDOL (ISOVUE-300) INJECTION 61% COMPARISON:  PET 12/08/2015, CT chest 11/23/2015 and CT abdomen 03/19/2015. FINDINGS: CT CHEST FINDINGS Cardiovascular: Right IJ Port-A-Cath terminates in the SVC. Coronary artery calcification. Heart size normal. No pericardial effusion. Mediastinum/Nodes: No pathologically enlarged mediastinal, hilar or axillary lymph nodes. Esophagus is grossly unremarkable. Lungs/Pleura: Heterogeneous collapse/consolidation in the O posterior upper right hemithorax is seen with associated bronchiectasis, stable. 10 mm nodule in the posterior left upper lobe is stable. Subpleural nodularity in the posterior left lower lobe measures 7 mm, also stable. No pleural fluid. Airway is otherwise unremarkable. Musculoskeletal: No worrisome lytic or sclerotic lesions. Degenerative changes are seen in the spine. CT ABDOMEN PELVIS FINDINGS Hepatobiliary: Partially calcified lesions in the liver measure up to 5.4 cm in the dome, likely stable, given slight differences in technique. Well-circumscribed low-attenuation lesions in the liver measure up to 1.4 cm, similar. Gallbladder is unremarkable. No biliary ductal dilatation. Pancreas: Negative. Spleen: Negative. Adrenals/Urinary Tract: Adrenal glands are unremarkable. Low-attenuation lesions in the kidneys measure up to 6.5 cm on the left and are likely cysts. Ureters are decompressed. Bladder is grossly unremarkable. Stomach/Bowel: Stomach, small bowel, appendix and majority of the colon are unremarkable. Postoperative changes are seen in the sigmoid. Vascular/Lymphatic: Atherosclerotic calcification of the arterial  vasculature without abdominal aortic aneurysm. No pathologically enlarged lymph nodes. Reproductive: Prostate is visualized. Other: Tiny periumbilical hernia contains fat. No free fluid. Mesenteries and peritoneum are otherwise unremarkable. Musculoskeletal: No worrisome lytic or sclerotic lesions. Changes of avascular necrosis are seen in the right femoral head, without cortical collapse. Degenerative disc disease at L2-3. IMPRESSION: 1. Hepatic metastatic disease is grossly stable. 2. Posttreatment changes in the right hemithorax. Nodular lesions in the left hemithorax were not shown to be hypermetabolic on 141/93/7902 Continued attention on followup exams is warranted. 3. Aortic atherosclerosis (ICD10-170.0). Coronary artery calcification. 4. 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 Electronically Signed   By: MLorin PicketM.D.   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 He received carboplatinum and etoposide along with XRT completing in approximately January 2015. Patient had a recurrence in his liver in September 2015 and was reinitiated on treatment with carboplatinum and etoposide again. He was then diagnosed with adenocarcinoma of the sigmoid colon with biopsy-proven metastasis to the liver in March 2016. K-ras wild-type. Patient initiated FOLFOX and Vectibix in April 2016. Patient was found to have a complete response, therefore was switched to Vectibix maintenance therapy in February 2017. Patient was then found to have progressive disease on a PET scan in May 2017 and [facility] 2015 revealed progressive disease and patient started FOLFIRI on December 15, 2015.   ASSESSMENT: Stage IV adenocarcinoma of the sigmoid colon with metastasis  to the liver.  PLAN:  1. Stage IV adenocarcinoma of the sigmoid colon with metastasis to the  liver: See patient's oncologic history above. CT scan results reviewed independently and reported as above with essentially stable disease in patient's liver. Despite this, his CEA continues to trend down. On previous review of patient's records, he did not receive any irinotecan. Proceed with cycle 8 of FOLFIRI + Avastin every 2 weeks. Return to clinic in 2 days for pump removal and Neulasta and then in 2 weeks for labs and consideration of cycle 9.  Total of 12 cycles planned. 2. Anemia: Mild, secondary treatment. Monitor. 3. Thrombocytopenia: Mild, monitor. 4. History of stage IV small cell lung cancer: No obvious evidence of recurrence. Will continue to monitor with routine imaging. 5. Neutropenia: Currently within normal limits. Neulasta with the remainder of cycles. 6. Insomia: Continue breathing technique as discussed with nurse practitioner. 6. Mouth pain: Continue hydrocodone as needed.  Patient expressed understanding and was in agreement with this plan. He also understands that He can call clinic at any time with any questions, concerns, or complaints.   Cancer Staging Carcinoma of sigmoid colon [facility]) Staging form: Colon and Rectum, AJCC 7th Edition - Clinical stage from 02/27/2016: Stage IVB (yT3, N1a, M1b) - Signed by Lloyd Huger, MD on 02/27/2016   Lloyd Huger, MD   03/24/2016 10:14 AM

## 2016-03-24 ENCOUNTER — Ambulatory Visit: Payer: Medicare Other

## 2016-03-24 ENCOUNTER — Inpatient Hospital Stay (HOSPITAL_BASED_OUTPATIENT_CLINIC_OR_DEPARTMENT_OTHER): Payer: Medicare Other | Admitting: Oncology

## 2016-03-24 ENCOUNTER — Inpatient Hospital Stay: Payer: Medicare Other | Attending: Oncology

## 2016-03-24 ENCOUNTER — Inpatient Hospital Stay: Payer: Medicare Other

## 2016-03-24 VITALS — BP 124/79 | HR 87 | Temp 96.6°F | Resp 18 | Wt 152.0 lb

## 2016-03-24 DIAGNOSIS — Z87442 Personal history of urinary calculi: Secondary | ICD-10-CM | POA: Insufficient documentation

## 2016-03-24 DIAGNOSIS — Z8546 Personal history of malignant neoplasm of prostate: Secondary | ICD-10-CM

## 2016-03-24 DIAGNOSIS — Z923 Personal history of irradiation: Secondary | ICD-10-CM | POA: Insufficient documentation

## 2016-03-24 DIAGNOSIS — D696 Thrombocytopenia, unspecified: Secondary | ICD-10-CM | POA: Diagnosis not present

## 2016-03-24 DIAGNOSIS — K1379 Other lesions of oral mucosa: Secondary | ICD-10-CM | POA: Insufficient documentation

## 2016-03-24 DIAGNOSIS — C787 Secondary malignant neoplasm of liver and intrahepatic bile duct: Secondary | ICD-10-CM | POA: Diagnosis not present

## 2016-03-24 DIAGNOSIS — K5900 Constipation, unspecified: Secondary | ICD-10-CM | POA: Diagnosis not present

## 2016-03-24 DIAGNOSIS — Z801 Family history of malignant neoplasm of trachea, bronchus and lung: Secondary | ICD-10-CM

## 2016-03-24 DIAGNOSIS — Z79899 Other long term (current) drug therapy: Secondary | ICD-10-CM | POA: Insufficient documentation

## 2016-03-24 DIAGNOSIS — C187 Malignant neoplasm of sigmoid colon: Secondary | ICD-10-CM

## 2016-03-24 DIAGNOSIS — G4700 Insomnia, unspecified: Secondary | ICD-10-CM

## 2016-03-24 DIAGNOSIS — D6481 Anemia due to antineoplastic chemotherapy: Secondary | ICD-10-CM | POA: Insufficient documentation

## 2016-03-24 DIAGNOSIS — Z87891 Personal history of nicotine dependence: Secondary | ICD-10-CM | POA: Insufficient documentation

## 2016-03-24 DIAGNOSIS — T451X5S Adverse effect of antineoplastic and immunosuppressive drugs, sequela: Secondary | ICD-10-CM | POA: Insufficient documentation

## 2016-03-24 DIAGNOSIS — I700 Atherosclerosis of aorta: Secondary | ICD-10-CM | POA: Diagnosis not present

## 2016-03-24 DIAGNOSIS — Z5112 Encounter for antineoplastic immunotherapy: Secondary | ICD-10-CM | POA: Diagnosis not present

## 2016-03-24 DIAGNOSIS — R0609 Other forms of dyspnea: Secondary | ICD-10-CM | POA: Insufficient documentation

## 2016-03-24 LAB — CBC WITH DIFFERENTIAL/PLATELET
BASOS PCT: 1 %
Basophils Absolute: 0.1 10*3/uL (ref 0–0.1)
EOS ABS: 0.1 10*3/uL (ref 0–0.7)
EOS PCT: 1 %
HEMATOCRIT: 30.6 % — AB (ref 40.0–52.0)
Hemoglobin: 10.5 g/dL — ABNORMAL LOW (ref 13.0–18.0)
Lymphocytes Relative: 26 %
Lymphs Abs: 2.4 10*3/uL (ref 1.0–3.6)
MCH: 33.1 pg (ref 26.0–34.0)
MCHC: 34.2 g/dL (ref 32.0–36.0)
MCV: 96.9 fL (ref 80.0–100.0)
MONO ABS: 0.9 10*3/uL (ref 0.2–1.0)
MONOS PCT: 9 %
NEUTROS ABS: 6 10*3/uL (ref 1.4–6.5)
Neutrophils Relative %: 63 %
PLATELETS: 142 10*3/uL — AB (ref 150–440)
RBC: 3.16 MIL/uL — ABNORMAL LOW (ref 4.40–5.90)
RDW: 17.9 % — AB (ref 11.5–14.5)
WBC: 9.4 10*3/uL (ref 3.8–10.6)

## 2016-03-24 LAB — COMPREHENSIVE METABOLIC PANEL
ALBUMIN: 4.1 g/dL (ref 3.5–5.0)
ALT: 11 U/L — ABNORMAL LOW (ref 17–63)
ANION GAP: 5 (ref 5–15)
AST: 22 U/L (ref 15–41)
Alkaline Phosphatase: 109 U/L (ref 38–126)
BILIRUBIN TOTAL: 0.4 mg/dL (ref 0.3–1.2)
BUN: 7 mg/dL (ref 6–20)
CHLORIDE: 104 mmol/L (ref 101–111)
CO2: 28 mmol/L (ref 22–32)
Calcium: 9.1 mg/dL (ref 8.9–10.3)
Creatinine, Ser: 0.77 mg/dL (ref 0.61–1.24)
GFR calc Af Amer: >60 mL/min (ref >60)
GLUCOSE: 99 mg/dL (ref 65–99)
POTASSIUM: 3.7 mmol/L (ref 3.5–5.1)
Sodium: 137 mmol/L (ref 135–145)
TOTAL PROTEIN: 7.2 g/dL (ref 6.5–8.1)

## 2016-03-24 LAB — PROTEIN, URINE, RANDOM: TOTAL PROTEIN, URINE: 7 mg/dL

## 2016-03-24 MED ORDER — ATROPINE SULFATE 1 MG/ML IJ SOLN
0.5000 mg | Freq: Once | INTRAMUSCULAR | Status: AC | PRN
  Administered 2016-03-24: 0.5 mg via INTRAVENOUS
  Filled 2016-03-24: qty 1

## 2016-03-24 MED ORDER — IRINOTECAN HCL CHEMO INJECTION 100 MG/5ML
180.0000 mg/m2 | Freq: Once | INTRAVENOUS | Status: AC
  Administered 2016-03-24: 340 mg via INTRAVENOUS
  Filled 2016-03-24: qty 15

## 2016-03-24 MED ORDER — DEXAMETHASONE SODIUM PHOSPHATE 10 MG/ML IJ SOLN
10.0000 mg | Freq: Once | INTRAMUSCULAR | Status: AC
  Administered 2016-03-24: 10 mg via INTRAVENOUS
  Filled 2016-03-24: qty 1

## 2016-03-24 MED ORDER — SODIUM CHLORIDE 0.9 % IV SOLN
300.0000 mg | Freq: Once | INTRAVENOUS | Status: AC
  Administered 2016-03-24: 300 mg via INTRAVENOUS
  Filled 2016-03-24: qty 12

## 2016-03-24 MED ORDER — SODIUM CHLORIDE 0.9 % IV SOLN
Freq: Once | INTRAVENOUS | Status: AC
  Administered 2016-03-24: 11:00:00 via INTRAVENOUS
  Filled 2016-03-24: qty 1000

## 2016-03-24 MED ORDER — HYDROCODONE-ACETAMINOPHEN 5-325 MG PO TABS: 1.0000 | ORAL_TABLET | Freq: Four times a day (QID) | ORAL | 0 refills | Status: DC | PRN

## 2016-03-24 MED ORDER — FLUOROURACIL CHEMO INJECTION 2.5 GM/50ML
400.0000 mg/m2 | Freq: Once | INTRAVENOUS | Status: AC
  Administered 2016-03-24: 750 mg via INTRAVENOUS
  Filled 2016-03-24: qty 15

## 2016-03-24 MED ORDER — LEUCOVORIN CALCIUM INJECTION 350 MG
750.0000 mg | Freq: Once | INTRAVENOUS | Status: AC
  Administered 2016-03-24: 750 mg via INTRAVENOUS
  Filled 2016-03-24: qty 20

## 2016-03-24 MED ORDER — FLUOROURACIL CHEMO INJECTION 5 GM/100ML
2400.0000 mg/m2 | INTRAVENOUS | Status: DC
  Administered 2016-03-24: 4450 mg via INTRAVENOUS
  Filled 2016-03-24: qty 89

## 2016-03-24 MED ORDER — PALONOSETRON HCL INJECTION 0.25 MG/5ML
0.2500 mg | Freq: Once | INTRAVENOUS | Status: AC
  Administered 2016-03-24: 0.25 mg via INTRAVENOUS
  Filled 2016-03-24: qty 5

## 2016-03-24 NOTE — Progress Notes (Signed)
Patient is here for follow up, he mentions going to the ER for an Ulcer in his mouth. He was given 6 hydrocodones and would like to have some on hand incase this comes back after next treatment

## 2016-03-25 LAB — CEA: CEA: 55.1 ng/mL — ABNORMAL HIGH (ref 0.0–4.7)

## 2016-03-26 ENCOUNTER — Inpatient Hospital Stay: Payer: Medicare Other

## 2016-03-26 DIAGNOSIS — C801 Malignant (primary) neoplasm, unspecified: Secondary | ICD-10-CM

## 2016-03-26 DIAGNOSIS — T451X5S Adverse effect of antineoplastic and immunosuppressive drugs, sequela: Secondary | ICD-10-CM | POA: Diagnosis not present

## 2016-03-26 DIAGNOSIS — C187 Malignant neoplasm of sigmoid colon: Secondary | ICD-10-CM | POA: Diagnosis not present

## 2016-03-26 DIAGNOSIS — D696 Thrombocytopenia, unspecified: Secondary | ICD-10-CM | POA: Diagnosis not present

## 2016-03-26 DIAGNOSIS — C787 Secondary malignant neoplasm of liver and intrahepatic bile duct: Secondary | ICD-10-CM | POA: Diagnosis not present

## 2016-03-26 DIAGNOSIS — D6481 Anemia due to antineoplastic chemotherapy: Secondary | ICD-10-CM | POA: Diagnosis not present

## 2016-03-26 DIAGNOSIS — Z5112 Encounter for antineoplastic immunotherapy: Secondary | ICD-10-CM | POA: Diagnosis not present

## 2016-03-26 MED ORDER — HEPARIN SOD (PORK) LOCK FLUSH 100 UNIT/ML IV SOLN
500.0000 [IU] | Freq: Once | INTRAVENOUS | Status: AC
  Administered 2016-03-26: 500 [IU] via INTRAVENOUS

## 2016-03-26 MED ORDER — SODIUM CHLORIDE 0.9% FLUSH
10.0000 mL | INTRAVENOUS | Status: DC | PRN
  Administered 2016-03-26: 10 mL via INTRAVENOUS
  Filled 2016-03-26: qty 10

## 2016-03-26 MED ORDER — PEGFILGRASTIM INJECTION 6 MG/0.6ML ~~LOC~~
6.0000 mg | PREFILLED_SYRINGE | Freq: Once | SUBCUTANEOUS | Status: AC
  Administered 2016-03-26: 6 mg via SUBCUTANEOUS
  Filled 2016-03-26: qty 0.6

## 2016-03-26 MED ORDER — HEPARIN SOD (PORK) LOCK FLUSH 100 UNIT/ML IV SOLN
INTRAVENOUS | Status: AC
  Filled 2016-03-26: qty 5

## 2016-04-06 DIAGNOSIS — Z7189 Other specified counseling: Secondary | ICD-10-CM | POA: Insufficient documentation

## 2016-04-06 NOTE — Progress Notes (Signed)
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  INTERVAL HISTORY: Patient returns to clinic today for further evaluation and consideration of cycle 9 of 12 of FOLFIRI plus Avastin.  He continues to tolerate his treatments well without significant side effects. He currently feels well and is asymptomatic. He has no neurologic complaints. He denies any recent fevers or illnesses. He has a good appetite and denies weight loss. He reports mild dyspnea on exertion. He denies any chest pain. He reports occasional constipation, relieved with OTC Ex-lax. He denies any nausea, vomiting, or diarrhea. He has no urinary complaints. Patient offers no other specific complaints today.   REVIEW OF SYSTEMS:   Review of Systems  Constitutional: Negative.  Negative for fever, malaise/fatigue and weight loss.  Respiratory: Positive for shortness of breath. Negative for cough and hemoptysis.   Cardiovascular: Negative.  Negative for chest pain and leg swelling.  Gastrointestinal: Positive for constipation. Negative for abdominal pain, blood in stool and melena.  Musculoskeletal: Negative.   Neurological: Negative.  Negative for sensory change and weakness.  Psychiatric/Behavioral: Negative.  The patient is not nervous/anxious.     As per HPI. Otherwise, a complete review of systems is negative.  PAST MEDICAL HISTORY: Past Medical History:  Diagnosis Date  . Brain cancer (HCC) 01/2014   Chemotherapy and radiation treatment  . Colon cancer (HCC) 01/2014  . Kidney stones 1975  . Liver cancer (HCC)   . Lung cancer [facility]) 2014   Radiation therapy  . Prostate cancer [facility]) 2005   treated by Dr Sheppard Penton    PAST SURGICAL HISTORY: Past Surgical History:  Procedure Laterality Date  . COLON SURGERY  05-03-14   Resection of Sigmoid Colon and liver biopsy  . kidney stones  1975  . PORTACATH PLACEMENT  2014  . PROSTATE SURGERY  2005   partial removal due to cancer    FAMILY HISTORY: Family History  Problem Relation Age of Onset  . Lung cancer Father   . Alzheimer's disease Mother     ADVANCED DIRECTIVES (Y/N):  N  HEALTH MAINTENANCE: Social History  Substance Use Topics  . Smoking status: Former Smoker    Years: 35.00  . Smokeless tobacco: Never Used  . Alcohol use No     Colonoscopy:  PAP:  Bone density:  Lipid panel:  No Known Allergies  Current Outpatient Prescriptions  Medication Sig Dispense Refill  . HYDROcodone-acetaminophen (NORCO) 5-325 MG tablet Take 1 tablet by mouth every 6 (six) hours as needed for severe pain. 30 tablet 0  . lidocaine (XYLOCAINE) 2 % solution Use as directed 10 mLs in the mouth or throat every 4 (four) hours as needed for mouth pain. 100 mL 0  . magic mouthwash SOLN Take 5 mLs by mouth 4 (four) times daily as needed for mouth pain. Please mix 30mL of each ingredient totaling 180 mL 0   No current facility-administered medications for this visit.    Facility-Administered Medications Ordered in Other Visits  Medication Dose Route Frequency Provider Last Rate Last Dose  . 0.9 %  sodium chloride infusion   Intravenous Once Jeralyn Ruths, MD      . atropine injection 0.5 mg  0.5 mg Intravenous  Once PRN Lloyd Huger, MD      . bevacizumab (AVASTIN) 300 mg in sodium chloride 0.9 % 100 mL chemo infusion  300 mg Intravenous Once Lloyd Huger, MD      . dexamethasone (DECADRON) 10 mg in sodium chloride 0.9 % 50 mL IVPB  10 mg Intravenous Once Lloyd Huger, MD      . dexamethasone (DECADRON) injection 10 mg  10 mg Intravenous Once Lloyd Huger, MD      . fluorouracil (ADRUCIL) 4,450 mg in sodium  chloride 0.9 % 61 mL chemo infusion  2,400 mg/m2 (Treatment Plan Recorded) Intravenous 1 day or 1 dose Lloyd Huger, MD      . fluorouracil (ADRUCIL) chemo injection 750 mg  400 mg/m2 (Treatment Plan Recorded) Intravenous Once Lloyd Huger, MD      . irinotecan (CAMPTOSAR) 340 mg in dextrose 5 % 500 mL chemo infusion  180 mg/m2 (Treatment Plan Recorded) Intravenous Once Lloyd Huger, MD      . leucovorin 750 mg in dextrose 5 % 250 mL infusion  750 mg Intravenous Once Lloyd Huger, MD      . palonosetron (ALOXI) injection 0.25 mg  0.25 mg Intravenous Once Lloyd Huger, MD      . sodium chloride 0.9 % injection 10 mL  10 mL Intracatheter PRN [name], MD   10 mL at 07/10/14 1428  . sodium chloride flush (NS) 0.9 % injection 10 mL  10 mL Intracatheter PRN Lloyd Huger, MD        OBJECTIVE: Vitals:   04/07/16 0908  BP: 110/81  Pulse: 90  Temp: 97.7 F (36.5 C)     Body mass index is 19.25 kg/m.    ECOG FS:1 - Symptomatic but completely ambulatory  General: Well-developed, well-nourished, no acute distress. Eyes: Pink conjunctiva, anicteric sclera. Lungs: Clear to auscultation bilaterally. Heart: Regular rate and rhythm. No rubs, murmurs, or gallops. Abdomen: Soft, nontender, nondistended. No organomegaly noted, normoactive bowel sounds. Musculoskeletal: No edema, cyanosis, or clubbing. Neuro: Alert, answering all questions appropriately. Cranial nerves grossly intact. Skin: No rashes or petechiae noted. Psych: Normal affect.   LAB RESULTS:  Lab Results  Component Value Date   NA 137 04/07/2016   K 3.5 04/07/2016   CL 105 04/07/2016   CO2 25 04/07/2016   GLUCOSE 90 04/07/2016   BUN 6 04/07/2016   CREATININE 0.89 04/07/2016   CALCIUM 9.1 04/07/2016   PROT 7.2 04/07/2016   ALBUMIN 4.2 04/07/2016   AST 21 04/07/2016   ALT 13 (L) 04/07/2016   ALKPHOS 109 04/07/2016   BILITOT 0.6 04/07/2016   GFRNONAA >60 04/07/2016   GFRAA >60 04/07/2016     Lab Results  Component Value Date   WBC 9.3 04/07/2016   NEUTROABS 5.5 04/07/2016   HGB 10.4 (L) 04/07/2016   HCT 30.6 (L) 04/07/2016   MCV 96.6 04/07/2016   PLT 131 (L) 04/07/2016   Lab Results  Component Value Date   CEA 55.1 (H) 03/24/2016     STUDIES: Ct Chest W Contrast  Result Date: 03/22/2016 CLINICAL DATA:  Restaging colon cancer.  Prostate cancer. EXAM: CT CHEST, ABDOMEN, AND PELVIS WITH CONTRAST TECHNIQUE: Multidetector CT imaging of the chest, abdomen and pelvis was performed following the standard protocol during bolus administration of intravenous contrast. CONTRAST:  170m ISOVUE-300 IOPAMIDOL (ISOVUE-300) INJECTION 61% COMPARISON:  PET 12/08/2015, CT chest 11/23/2015 and CT abdomen 03/19/2015. FINDINGS: CT CHEST FINDINGS Cardiovascular: Right IJ Port-A-Cath terminates  in the SVC. Coronary artery calcification. Heart size normal. No pericardial effusion. Mediastinum/Nodes: No pathologically enlarged mediastinal, hilar or axillary lymph nodes. Esophagus is grossly unremarkable. Lungs/Pleura: Heterogeneous collapse/consolidation in the O posterior upper right hemithorax is seen with associated bronchiectasis, stable. 10 mm nodule in the posterior left upper lobe is stable. Subpleural nodularity in the posterior left lower lobe measures 7 mm, also stable. No pleural fluid. Airway is otherwise unremarkable. Musculoskeletal: No worrisome lytic or sclerotic lesions. Degenerative changes are seen in the spine. CT ABDOMEN PELVIS FINDINGS Hepatobiliary: Partially calcified lesions in the liver measure up to 5.4 cm in the dome, likely stable, given slight differences in technique. Well-circumscribed low-attenuation lesions in the liver measure up to 1.4 cm, similar. Gallbladder is unremarkable. No biliary ductal dilatation. Pancreas: Negative. Spleen: Negative. Adrenals/Urinary Tract: Adrenal glands are unremarkable. Low-attenuation lesions in the kidneys measure up to 6.5 cm on the left  and are likely cysts. Ureters are decompressed. Bladder is grossly unremarkable. Stomach/Bowel: Stomach, small bowel, appendix and majority of the colon are unremarkable. Postoperative changes are seen in the sigmoid. Vascular/Lymphatic: Atherosclerotic calcification of the arterial vasculature without abdominal aortic aneurysm. No pathologically enlarged lymph nodes. Reproductive: Prostate is visualized. Other: Tiny periumbilical hernia contains fat. No free fluid. Mesenteries and peritoneum are otherwise unremarkable. Musculoskeletal: No worrisome lytic or sclerotic lesions. Changes of avascular necrosis are seen in the right femoral head, without cortical collapse. Degenerative disc disease at L2-3. IMPRESSION: 1. Hepatic metastatic disease is grossly stable. 2. Posttreatment changes in the right hemithorax. Nodular lesions in the left hemithorax were not shown to be hypermetabolic on 12/87/8676. Continued attention on followup exams is warranted. 3. Aortic atherosclerosis (ICD10-170.0). Coronary artery calcification. 4. 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 Electronically Signed   By: Lorin Picket M.D.   On: 03/22/2016 11:54   Ct Abdomen Pelvis W Contrast  Result Date: 03/22/2016 CLINICAL DATA:  Restaging colon cancer.  Prostate cancer. EXAM: CT CHEST, ABDOMEN, AND PELVIS WITH CONTRAST TECHNIQUE: Multidetector CT imaging of the chest, abdomen and pelvis was performed following the standard protocol during bolus administration of intravenous contrast. CONTRAST:  147m ISOVUE-300 IOPAMIDOL (ISOVUE-300) INJECTION 61% COMPARISON:  PET 12/08/2015, CT chest 11/23/2015 and CT abdomen 03/19/2015. FINDINGS: CT CHEST FINDINGS Cardiovascular: Right IJ Port-A-Cath terminates in the SVC. Coronary artery calcification. Heart size normal. No pericardial effusion. Mediastinum/Nodes: No pathologically enlarged mediastinal, hilar or axillary lymph nodes. Esophagus is grossly unremarkable. Lungs/Pleura: Heterogeneous  collapse/consolidation in the O posterior upper right hemithorax is seen with associated bronchiectasis, stable. 10 mm nodule in the posterior left upper lobe is stable. Subpleural nodularity in the posterior left lower lobe measures 7 mm, also stable. No pleural fluid. Airway is otherwise unremarkable. Musculoskeletal: No worrisome lytic or sclerotic lesions. Degenerative changes are seen in the spine. CT ABDOMEN PELVIS FINDINGS Hepatobiliary: Partially calcified lesions in the liver measure up to 5.4 cm in the dome, likely stable, given slight differences in technique. Well-circumscribed low-attenuation lesions in the liver measure up to 1.4 cm, similar. Gallbladder is unremarkable. No biliary ductal dilatation. Pancreas: Negative. Spleen: Negative. Adrenals/Urinary Tract: Adrenal glands are unremarkable. Low-attenuation lesions in the kidneys measure up to 6.5 cm on the left and are likely cysts. Ureters are decompressed. Bladder is grossly unremarkable. Stomach/Bowel: Stomach, small bowel, appendix and majority of the colon are unremarkable. Postoperative changes are seen in the sigmoid. Vascular/Lymphatic: Atherosclerotic calcification of the arterial vasculature without abdominal aortic aneurysm. No pathologically enlarged lymph nodes. Reproductive: Prostate is visualized. Other: Tiny periumbilical hernia  contains fat. No free fluid. Mesenteries and peritoneum are otherwise unremarkable. Musculoskeletal: No worrisome lytic or sclerotic lesions. Changes of avascular necrosis are seen in the right femoral head, without cortical collapse. Degenerative disc disease at L2-3. IMPRESSION: 1. Hepatic metastatic disease is grossly stable. 2. Posttreatment changes in the right hemithorax. Nodular lesions in the left hemithorax were not shown to be hypermetabolic on 03/75/4360. Continued attention on followup exams is warranted. 3. Aortic atherosclerosis (ICD10-170.0). Coronary artery calcification. 4. Avascular necrosis  in the right femoral head. Electronically Signed   By: Lorin Picket M.D.   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 He received carboplatinum and etoposide along with XRT completing in approximately January 2015. Patient had a recurrence in his liver in September 2015 and was reinitiated on treatment with carboplatinum and etoposide again. He was then diagnosed with adenocarcinoma of the sigmoid colon with biopsy-proven metastasis to the liver in March 2016. K-ras wild-type. Patient initiated FOLFOX and Vectibix in April 2016. Patient was found to have a complete response, therefore was switched to Vectibix maintenance therapy in February 2017. Patient was then found to have progressive disease on a PET scan in May 2017 and was subsequently switched to [facility]. PET scan on December 08, 2015 revealed progressive disease and patient started FOLFIRI on December 15, 2015.   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  PLAN:  1. 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 CT scan results reviewed independently with essentially stable disease in patient's liver. Despite this, his CEA continues to trend down.  Proceed with cycle 9 of FOLFIRI + Avastin every 2 weeks. Return to clinic in 2 days for pump removal and Neulasta and then in 2 weeks for labs and consideration of cycle 10. Plan to reimage at the conclusion of cycle 12. 2. Anemia: Mild, secondary treatment. Monitor. 3. Thrombocytopenia: Mild, monitor. 4. History of stage IV small cell lung cancer: No obvious evidence of recurrence. Will continue to monitor with routine imaging. 5. Neutropenia: Currently within normal limits. Neulasta with the remainder of cycles. 6. Insomia: Continue breathing technique as discussed with nurse  practitioner. 6. Mouth pain: Continue hydrocodone as needed. Patient was given a prescription today.  Patient expressed understanding and was in agreement with this plan. He also understands that He can call clinic at any time with any questions, concerns, or complaints.   Cancer Staging Carcinoma of sigmoid colon [facility]) Staging form: Colon and Rectum, AJCC 7th Edition - Clinical stage from 02/27/2016: Stage IVB (yT3, N1a, M1b) - Signed by Lloyd Huger, MD on 02/27/2016   Lloyd Huger, MD   04/07/2016 9:24 AM

## 2016-04-07 ENCOUNTER — Inpatient Hospital Stay: Payer: Medicare Other

## 2016-04-07 ENCOUNTER — Inpatient Hospital Stay (HOSPITAL_BASED_OUTPATIENT_CLINIC_OR_DEPARTMENT_OTHER): Payer: Medicare Other | Admitting: Oncology

## 2016-04-07 ENCOUNTER — Encounter: Payer: Self-pay | Admitting: Oncology

## 2016-04-07 VITALS — BP 110/81 | HR 90 | Temp 97.7°F | Wt 149.9 lb

## 2016-04-07 DIAGNOSIS — D6481 Anemia due to antineoplastic chemotherapy: Secondary | ICD-10-CM | POA: Diagnosis not present

## 2016-04-07 DIAGNOSIS — C187 Malignant neoplasm of sigmoid colon: Secondary | ICD-10-CM

## 2016-04-07 DIAGNOSIS — Z923 Personal history of irradiation: Secondary | ICD-10-CM

## 2016-04-07 DIAGNOSIS — K5900 Constipation, unspecified: Secondary | ICD-10-CM | POA: Diagnosis not present

## 2016-04-07 DIAGNOSIS — Z8546 Personal history of malignant neoplasm of prostate: Secondary | ICD-10-CM

## 2016-04-07 DIAGNOSIS — Z79899 Other long term (current) drug therapy: Secondary | ICD-10-CM

## 2016-04-07 DIAGNOSIS — C787 Secondary malignant neoplasm of liver and intrahepatic bile duct: Secondary | ICD-10-CM

## 2016-04-07 DIAGNOSIS — Z7189 Other specified counseling: Secondary | ICD-10-CM

## 2016-04-07 DIAGNOSIS — Z87891 Personal history of nicotine dependence: Secondary | ICD-10-CM

## 2016-04-07 DIAGNOSIS — T451X5S Adverse effect of antineoplastic and immunosuppressive drugs, sequela: Secondary | ICD-10-CM | POA: Diagnosis not present

## 2016-04-07 DIAGNOSIS — D696 Thrombocytopenia, unspecified: Secondary | ICD-10-CM

## 2016-04-07 DIAGNOSIS — I700 Atherosclerosis of aorta: Secondary | ICD-10-CM | POA: Diagnosis not present

## 2016-04-07 DIAGNOSIS — R0609 Other forms of dyspnea: Secondary | ICD-10-CM

## 2016-04-07 DIAGNOSIS — Z87442 Personal history of urinary calculi: Secondary | ICD-10-CM

## 2016-04-07 DIAGNOSIS — G4700 Insomnia, unspecified: Secondary | ICD-10-CM

## 2016-04-07 DIAGNOSIS — Z801 Family history of malignant neoplasm of trachea, bronchus and lung: Secondary | ICD-10-CM

## 2016-04-07 DIAGNOSIS — Z5112 Encounter for antineoplastic immunotherapy: Secondary | ICD-10-CM | POA: Diagnosis not present

## 2016-04-07 LAB — COMPREHENSIVE METABOLIC PANEL
ALBUMIN: 4.2 g/dL (ref 3.5–5.0)
ALT: 13 U/L — AB (ref 17–63)
AST: 21 U/L (ref 15–41)
Alkaline Phosphatase: 109 U/L (ref 38–126)
Anion gap: 7 (ref 5–15)
BUN: 6 mg/dL (ref 6–20)
CHLORIDE: 105 mmol/L (ref 101–111)
CO2: 25 mmol/L (ref 22–32)
CREATININE: 0.89 mg/dL (ref 0.61–1.24)
Calcium: 9.1 mg/dL (ref 8.9–10.3)
GFR calc Af Amer: >60 mL/min (ref >60)
GFR calc non Af Amer: >60 mL/min (ref >60)
GLUCOSE: 90 mg/dL (ref 65–99)
POTASSIUM: 3.5 mmol/L (ref 3.5–5.1)
SODIUM: 137 mmol/L (ref 135–145)
Total Bilirubin: 0.6 mg/dL (ref 0.3–1.2)
Total Protein: 7.2 g/dL (ref 6.5–8.1)

## 2016-04-07 LAB — CBC WITH DIFFERENTIAL/PLATELET
BASOS ABS: 0 10*3/uL (ref 0–0.1)
BASOS PCT: 0 %
EOS PCT: 1 %
Eosinophils Absolute: 0.1 10*3/uL (ref 0–0.7)
HCT: 30.6 % — ABNORMAL LOW (ref 40.0–52.0)
Hemoglobin: 10.4 g/dL — ABNORMAL LOW (ref 13.0–18.0)
LYMPHS PCT: 29 %
Lymphs Abs: 2.7 10*3/uL (ref 1.0–3.6)
MCH: 32.9 pg (ref 26.0–34.0)
MCHC: 34 g/dL (ref 32.0–36.0)
MCV: 96.6 fL (ref 80.0–100.0)
MONO ABS: 0.9 10*3/uL (ref 0.2–1.0)
Monocytes Relative: 10 %
Neutro Abs: 5.5 10*3/uL (ref 1.4–6.5)
Neutrophils Relative %: 60 %
PLATELETS: 131 10*3/uL — AB (ref 150–440)
RBC: 3.17 MIL/uL — AB (ref 4.40–5.90)
RDW: 17.4 % — AB (ref 11.5–14.5)
WBC: 9.3 10*3/uL (ref 3.8–10.6)

## 2016-04-07 MED ORDER — IRINOTECAN HCL CHEMO INJECTION 100 MG/5ML
180.0000 mg/m2 | Freq: Once | INTRAVENOUS | Status: AC
  Administered 2016-04-07: 340 mg via INTRAVENOUS
  Filled 2016-04-07: qty 15

## 2016-04-07 MED ORDER — SODIUM CHLORIDE 0.9 % IV SOLN
300.0000 mg | Freq: Once | INTRAVENOUS | Status: AC
  Administered 2016-04-07: 300 mg via INTRAVENOUS
  Filled 2016-04-07: qty 12

## 2016-04-07 MED ORDER — ATROPINE SULFATE 1 MG/ML IJ SOLN
0.5000 mg | Freq: Once | INTRAMUSCULAR | Status: AC | PRN
  Administered 2016-04-07: 0.5 mg via INTRAVENOUS
  Filled 2016-04-07: qty 1

## 2016-04-07 MED ORDER — HYDROCODONE-ACETAMINOPHEN 5-325 MG PO TABS
1.0000 | ORAL_TABLET | Freq: Four times a day (QID) | ORAL | 0 refills | Status: DC | PRN
Start: 2016-04-07 — End: 2016-10-07

## 2016-04-07 MED ORDER — SODIUM CHLORIDE 0.9 % IV SOLN
Freq: Once | INTRAVENOUS | Status: AC
  Administered 2016-04-07: 09:00:00 via INTRAVENOUS
  Filled 2016-04-07: qty 1000

## 2016-04-07 MED ORDER — DEXAMETHASONE SODIUM PHOSPHATE 10 MG/ML IJ SOLN
10.0000 mg | Freq: Once | INTRAMUSCULAR | Status: AC
  Administered 2016-04-07: 10 mg via INTRAVENOUS
  Filled 2016-04-07: qty 1

## 2016-04-07 MED ORDER — SODIUM CHLORIDE 0.9 % IV SOLN: 10.0000 mg | Freq: Once | INTRAVENOUS | Status: DC

## 2016-04-07 MED ORDER — LEUCOVORIN CALCIUM INJECTION 350 MG
750.0000 mg | Freq: Once | INTRAVENOUS | Status: AC
  Administered 2016-04-07: 750 mg via INTRAVENOUS
  Filled 2016-04-07: qty 17.5

## 2016-04-07 MED ORDER — SODIUM CHLORIDE 0.9 % IV SOLN
2400.0000 mg/m2 | INTRAVENOUS | Status: DC
  Administered 2016-04-07: 4450 mg via INTRAVENOUS
  Filled 2016-04-07: qty 89

## 2016-04-07 MED ORDER — FLUOROURACIL CHEMO INJECTION 2.5 GM/50ML
400.0000 mg/m2 | Freq: Once | INTRAVENOUS | Status: AC
  Administered 2016-04-07: 750 mg via INTRAVENOUS
  Filled 2016-04-07: qty 15

## 2016-04-07 MED ORDER — SODIUM CHLORIDE 0.9% FLUSH
10.0000 mL | INTRAVENOUS | Status: DC | PRN
  Administered 2016-04-07: 10 mL
  Filled 2016-04-07: qty 10

## 2016-04-07 MED ORDER — PALONOSETRON HCL INJECTION 0.25 MG/5ML
0.2500 mg | Freq: Once | INTRAVENOUS | Status: AC
  Administered 2016-04-07: 0.25 mg via INTRAVENOUS
  Filled 2016-04-07: qty 5

## 2016-04-08 LAB — CEA: CEA: 52.1 ng/mL — ABNORMAL HIGH (ref 0.0–4.7)

## 2016-04-09 ENCOUNTER — Inpatient Hospital Stay: Payer: Medicare Other

## 2016-04-09 VITALS — BP 122/79 | HR 75 | Temp 97.0°F | Resp 18

## 2016-04-09 DIAGNOSIS — Z5112 Encounter for antineoplastic immunotherapy: Secondary | ICD-10-CM | POA: Diagnosis not present

## 2016-04-09 DIAGNOSIS — C187 Malignant neoplasm of sigmoid colon: Secondary | ICD-10-CM

## 2016-04-09 DIAGNOSIS — T451X5S Adverse effect of antineoplastic and immunosuppressive drugs, sequela: Secondary | ICD-10-CM | POA: Diagnosis not present

## 2016-04-09 DIAGNOSIS — D696 Thrombocytopenia, unspecified: Secondary | ICD-10-CM | POA: Diagnosis not present

## 2016-04-09 DIAGNOSIS — C787 Secondary malignant neoplasm of liver and intrahepatic bile duct: Secondary | ICD-10-CM | POA: Diagnosis not present

## 2016-04-09 DIAGNOSIS — D6481 Anemia due to antineoplastic chemotherapy: Secondary | ICD-10-CM | POA: Diagnosis not present

## 2016-04-09 MED ORDER — HEPARIN SOD (PORK) LOCK FLUSH 100 UNIT/ML IV SOLN
500.0000 [IU] | Freq: Once | INTRAVENOUS | Status: AC | PRN
  Administered 2016-04-09: 500 [IU]

## 2016-04-09 MED ORDER — SODIUM CHLORIDE 0.9% FLUSH
10.0000 mL | INTRAVENOUS | Status: DC | PRN
  Administered 2016-04-09: 10 mL
  Filled 2016-04-09: qty 10

## 2016-04-09 MED ORDER — HEPARIN SOD (PORK) LOCK FLUSH 100 UNIT/ML IV SOLN
INTRAVENOUS | Status: AC
  Filled 2016-04-09: qty 5

## 2016-04-09 MED ORDER — PEGFILGRASTIM INJECTION 6 MG/0.6ML ~~LOC~~
6.0000 mg | PREFILLED_SYRINGE | Freq: Once | SUBCUTANEOUS | Status: AC
  Administered 2016-04-09: 6 mg via SUBCUTANEOUS
  Filled 2016-04-09: qty 0.6

## 2016-04-20 NOTE — Progress Notes (Signed)
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  INTERVAL HISTORY: Patient returns to clinic today for further evaluation and consideration of cycle 10 of 12 of FOLFIRI plus Avastin.  He continues to tolerate his treatments well without significant side effects. He currently feels well and is asymptomatic. He has no neurologic complaints. He denies any recent fevers or illnesses. He has a good appetite, but has lost 1-2 pounds in the interim. He reports mild dyspnea on exertion. He denies any chest pain. He reports occasional constipation. He denies any nausea, vomiting, or diarrhea. He has no urinary complaints. Patient offers no other specific complaints today.   REVIEW OF SYSTEMS:   Review of Systems  Constitutional: Negative.  Negative for fever, malaise/fatigue and weight loss.  Respiratory: Positive for shortness of breath. Negative for cough and hemoptysis.   Cardiovascular: Negative.  Negative for chest pain and leg swelling.  Gastrointestinal: Positive for constipation. Negative for abdominal pain, blood in stool and melena.  Musculoskeletal: Negative.   Neurological: Negative.  Negative for sensory change and weakness.  Psychiatric/Behavioral: Negative.  The patient is not nervous/anxious.     As per HPI. Otherwise, a complete review of systems is negative.  PAST MEDICAL HISTORY: Past Medical History:  Diagnosis Date  . Brain cancer ([place]) 01/2014   Chemotherapy and radiation treatment  . Colon cancer ([place]) 01/2014  . Kidney stones 1975  . Liver cancer ([place])   . Lung cancer [facility]) 2014   Radiation therapy  . Prostate cancer [facility]) 2005   treated  by Dr Eliberto Ivory    PAST SURGICAL HISTORY: Past Surgical History:  Procedure Laterality Date  . COLON SURGERY  05-03-14   Resection of Sigmoid Colon and liver biopsy  . kidney stones  1975  . PORTACATH PLACEMENT  2014  . PROSTATE SURGERY  2005   partial removal due to cancer    FAMILY HISTORY: Family History  Problem Relation Age of Onset  . Lung cancer Father   . Alzheimer's disease Mother     ADVANCED DIRECTIVES (Y/N):  N  HEALTH MAINTENANCE: Social History  Substance Use Topics  . Smoking status: Former Smoker    Years: 35.00  . Smokeless tobacco: Never Used  . Alcohol use No     Colonoscopy:  PAP:  Bone density:  Lipid panel:  No Known Allergies  Current Outpatient Prescriptions  Medication Sig Dispense Refill  . HYDROcodone-acetaminophen (NORCO) 5-325 MG tablet Take 1 tablet by mouth every 6 (six) hours as needed for severe pain. 30 tablet 0  . lidocaine (XYLOCAINE) 2 % solution Use as directed 10 mLs in the mouth or throat every 4 (four) hours as needed for mouth pain. 100 mL 0  . magic mouthwash SOLN Take 5 mLs by mouth 4 (four) times daily as needed for mouth pain. Please mix 70m of each ingredient totaling 1841m180 mL 0   No current facility-administered medications for this visit.    Facility-Administered Medications Ordered in Other Visits  Medication Dose Route Frequency Provider Last Rate Last Dose  . sodium chloride 0.9 % injection 10 mL  10 mL Intracatheter PRN JaForest GleasonMD   10 mL at 07/10/14 1428    OBJECTIVE: Vitals:  04/21/16 0946  BP: 127/86  Pulse: (!) 103  Resp: 18  Temp: (!) 95.6 F (35.3 C)     Body mass index is 18.99 kg/m.    ECOG FS:1 - Symptomatic but completely ambulatory  General: Well-developed, well-nourished, no acute distress. Eyes: Pink conjunctiva, anicteric sclera. Lungs: Clear to auscultation bilaterally. Heart: Regular rate and rhythm. No rubs, murmurs, or gallops. Abdomen: Soft, nontender, nondistended. No  organomegaly noted, normoactive bowel sounds. Musculoskeletal: No edema, cyanosis, or clubbing. Neuro: Alert, answering all questions appropriately. Cranial nerves grossly intact. Skin: No rashes or petechiae noted. Psych: Normal affect.   LAB RESULTS:  Lab Results  Component Value Date   NA 139 04/21/2016   K 3.7 04/21/2016   CL 105 04/21/2016   CO2 28 04/21/2016   GLUCOSE 96 04/21/2016   BUN 10 04/21/2016   CREATININE 0.78 04/21/2016   CALCIUM 9.5 04/21/2016   PROT 7.0 04/21/2016   ALBUMIN 4.2 04/21/2016   AST 21 04/21/2016   ALT 13 (L) 04/21/2016   ALKPHOS 110 04/21/2016   BILITOT 0.3 04/21/2016   GFRNONAA >60 04/21/2016   GFRAA >60 04/21/2016    Lab Results  Component Value Date   WBC 7.8 04/21/2016   NEUTROABS 4.8 04/21/2016   HGB 10.5 (L) 04/21/2016   HCT 31.2 (L) 04/21/2016   MCV 97.5 04/21/2016   PLT 116 (L) 04/21/2016   Lab Results  Component Value Date   CEA 55.6 (H) 04/21/2016     STUDIES: No results found. 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 He received carboplatinum and etoposide along with XRT completing in approximately January 2015. Patient had a recurrence in his liver in September 2015 and was reinitiated on treatment with carboplatinum and etoposide again. He was then diagnosed with adenocarcinoma of the sigmoid colon with biopsy-proven metastasis to the liver in March 2016. K-ras wild-type. Patient initiated FOLFOX and Vectibix in April 2016. Patient was found to have a complete response, therefore was switched to Vectibix maintenance therapy in February 2017. Patient was then found to have progressive disease on a PET scan in May 2017 and was subsequently switched to [facility]. PET scan on December 08, 2015 revealed progressive disease and patient started FOLFIRI on December 15, 2015.   ASSESSMENT: Stage IV adenocarcinoma of the sigmoid colon with metastasis  to the liver.  PLAN:  1. 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 CT scan results reviewed independently with essentially stable disease in patient's liver. Despite this, his CEA continues to trend down.  Proceed with cycle 10 of FOLFIRI + Avastin every 2 weeks. Return to clinic in 2 days for pump removal and Neulasta and then in 2 weeks for labs and consideration of cycle 11. Plan to reimage at the conclusion of cycle 12. 2. Anemia: Mild, secondary treatment. Monitor. 3. Thrombocytopenia: Mild, monitor. 4. History of stage IV small cell lung cancer: No obvious evidence of recurrence. Will continue to monitor with routine imaging. 5. Neutropenia: Currently within normal limits. Neulasta with the remainder of cycles. 6. Mouth pain: Continue hydrocodone as needed. 7. Constipation: Continue over-the-counter treatments as indicated.  Patient expressed understanding and was in agreement with this plan. He also understands that He can call clinic at any time with any questions, concerns, or complaints.   Cancer Staging Carcinoma of sigmoid colon [facility]) Staging form: Colon and Rectum, AJCC 7th Edition -  Clinical stage from 02/27/2016: Stage IVB (yT3, N1a, M1b) - Signed by Lloyd Huger, MD on 02/27/2016   Lloyd Huger, MD   04/25/2016 8:39 AM

## 2016-04-21 ENCOUNTER — Inpatient Hospital Stay: Payer: Medicare Other

## 2016-04-21 ENCOUNTER — Inpatient Hospital Stay: Payer: Medicare Other | Attending: Oncology

## 2016-04-21 ENCOUNTER — Inpatient Hospital Stay (HOSPITAL_BASED_OUTPATIENT_CLINIC_OR_DEPARTMENT_OTHER): Payer: Medicare Other | Admitting: Oncology

## 2016-04-21 VITALS — BP 127/86 | HR 103 | Temp 95.6°F | Resp 18 | Wt 147.9 lb

## 2016-04-21 DIAGNOSIS — D6481 Anemia due to antineoplastic chemotherapy: Secondary | ICD-10-CM

## 2016-04-21 DIAGNOSIS — C787 Secondary malignant neoplasm of liver and intrahepatic bile duct: Secondary | ICD-10-CM | POA: Diagnosis not present

## 2016-04-21 DIAGNOSIS — K1379 Other lesions of oral mucosa: Secondary | ICD-10-CM | POA: Insufficient documentation

## 2016-04-21 DIAGNOSIS — T451X5S Adverse effect of antineoplastic and immunosuppressive drugs, sequela: Secondary | ICD-10-CM | POA: Diagnosis not present

## 2016-04-21 DIAGNOSIS — Z5112 Encounter for antineoplastic immunotherapy: Secondary | ICD-10-CM | POA: Insufficient documentation

## 2016-04-21 DIAGNOSIS — D696 Thrombocytopenia, unspecified: Secondary | ICD-10-CM | POA: Diagnosis not present

## 2016-04-21 DIAGNOSIS — Z85841 Personal history of malignant neoplasm of brain: Secondary | ICD-10-CM

## 2016-04-21 DIAGNOSIS — Z87442 Personal history of urinary calculi: Secondary | ICD-10-CM | POA: Diagnosis not present

## 2016-04-21 DIAGNOSIS — C187 Malignant neoplasm of sigmoid colon: Secondary | ICD-10-CM

## 2016-04-21 DIAGNOSIS — Z923 Personal history of irradiation: Secondary | ICD-10-CM | POA: Diagnosis not present

## 2016-04-21 DIAGNOSIS — Z85118 Personal history of other malignant neoplasm of bronchus and lung: Secondary | ICD-10-CM | POA: Insufficient documentation

## 2016-04-21 DIAGNOSIS — Z79899 Other long term (current) drug therapy: Secondary | ICD-10-CM | POA: Insufficient documentation

## 2016-04-21 DIAGNOSIS — K5900 Constipation, unspecified: Secondary | ICD-10-CM | POA: Diagnosis not present

## 2016-04-21 DIAGNOSIS — Z8546 Personal history of malignant neoplasm of prostate: Secondary | ICD-10-CM | POA: Insufficient documentation

## 2016-04-21 DIAGNOSIS — Z801 Family history of malignant neoplasm of trachea, bronchus and lung: Secondary | ICD-10-CM

## 2016-04-21 DIAGNOSIS — R0609 Other forms of dyspnea: Secondary | ICD-10-CM | POA: Diagnosis not present

## 2016-04-21 DIAGNOSIS — Z7689 Persons encountering health services in other specified circumstances: Secondary | ICD-10-CM | POA: Diagnosis not present

## 2016-04-21 DIAGNOSIS — Z9221 Personal history of antineoplastic chemotherapy: Secondary | ICD-10-CM | POA: Diagnosis not present

## 2016-04-21 DIAGNOSIS — R197 Diarrhea, unspecified: Secondary | ICD-10-CM | POA: Diagnosis not present

## 2016-04-21 DIAGNOSIS — Z87891 Personal history of nicotine dependence: Secondary | ICD-10-CM | POA: Diagnosis not present

## 2016-04-21 LAB — CBC WITH DIFFERENTIAL/PLATELET
BASOS ABS: 0 10*3/uL (ref 0–0.1)
BASOS PCT: 0 %
EOS ABS: 0.1 10*3/uL (ref 0–0.7)
Eosinophils Relative: 1 %
HCT: 31.2 % — ABNORMAL LOW (ref 40.0–52.0)
Hemoglobin: 10.5 g/dL — ABNORMAL LOW (ref 13.0–18.0)
Lymphocytes Relative: 26 %
Lymphs Abs: 2 10*3/uL (ref 1.0–3.6)
MCH: 32.9 pg (ref 26.0–34.0)
MCHC: 33.7 g/dL (ref 32.0–36.0)
MCV: 97.5 fL (ref 80.0–100.0)
MONOS PCT: 11 %
Monocytes Absolute: 0.8 10*3/uL (ref 0.2–1.0)
NEUTROS ABS: 4.8 10*3/uL (ref 1.4–6.5)
NEUTROS PCT: 62 %
PLATELETS: 116 10*3/uL — AB (ref 150–440)
RBC: 3.2 MIL/uL — ABNORMAL LOW (ref 4.40–5.90)
RDW: 17.7 % — AB (ref 11.5–14.5)
WBC: 7.8 10*3/uL (ref 3.8–10.6)

## 2016-04-21 LAB — COMPREHENSIVE METABOLIC PANEL
ALT: 13 U/L — ABNORMAL LOW (ref 17–63)
AST: 21 U/L (ref 15–41)
Albumin: 4.2 g/dL (ref 3.5–5.0)
Alkaline Phosphatase: 110 U/L (ref 38–126)
Anion gap: 6 (ref 5–15)
BILIRUBIN TOTAL: 0.3 mg/dL (ref 0.3–1.2)
BUN: 10 mg/dL (ref 6–20)
CO2: 28 mmol/L (ref 22–32)
CREATININE: 0.78 mg/dL (ref 0.61–1.24)
Calcium: 9.5 mg/dL (ref 8.9–10.3)
Chloride: 105 mmol/L (ref 101–111)
Glucose, Bld: 96 mg/dL (ref 65–99)
Potassium: 3.7 mmol/L (ref 3.5–5.1)
Sodium: 139 mmol/L (ref 135–145)
TOTAL PROTEIN: 7 g/dL (ref 6.5–8.1)

## 2016-04-21 MED ORDER — DEXAMETHASONE SODIUM PHOSPHATE 10 MG/ML IJ SOLN
10.0000 mg | Freq: Once | INTRAMUSCULAR | Status: AC
  Administered 2016-04-21: 10 mg via INTRAVENOUS
  Filled 2016-04-21: qty 1

## 2016-04-21 MED ORDER — SODIUM CHLORIDE 0.9% FLUSH
10.0000 mL | INTRAVENOUS | Status: DC | PRN
  Filled 2016-04-21: qty 10

## 2016-04-21 MED ORDER — FLUOROURACIL CHEMO INJECTION 5 GM/100ML
2400.0000 mg/m2 | INTRAVENOUS | Status: DC
  Administered 2016-04-21: 4450 mg via INTRAVENOUS
  Filled 2016-04-21: qty 89

## 2016-04-21 MED ORDER — PALONOSETRON HCL INJECTION 0.25 MG/5ML
0.2500 mg | Freq: Once | INTRAVENOUS | Status: AC
  Administered 2016-04-21: 0.25 mg via INTRAVENOUS
  Filled 2016-04-21: qty 5

## 2016-04-21 MED ORDER — SODIUM CHLORIDE 0.9 % IV SOLN
Freq: Once | INTRAVENOUS | Status: AC
  Administered 2016-04-21: 10:00:00 via INTRAVENOUS
  Filled 2016-04-21: qty 1000

## 2016-04-21 MED ORDER — LEUCOVORIN CALCIUM INJECTION 350 MG
750.0000 mg | Freq: Once | INTRAVENOUS | Status: AC
  Administered 2016-04-21: 750 mg via INTRAVENOUS
  Filled 2016-04-21: qty 20

## 2016-04-21 MED ORDER — SODIUM CHLORIDE 0.9 % IV SOLN
300.0000 mg | Freq: Once | INTRAVENOUS | Status: AC
  Administered 2016-04-21: 300 mg via INTRAVENOUS
  Filled 2016-04-21: qty 12

## 2016-04-21 MED ORDER — FLUOROURACIL CHEMO INJECTION 2.5 GM/50ML
400.0000 mg/m2 | Freq: Once | INTRAVENOUS | Status: AC
  Administered 2016-04-21: 750 mg via INTRAVENOUS
  Filled 2016-04-21: qty 15

## 2016-04-21 MED ORDER — ATROPINE SULFATE 1 MG/ML IJ SOLN
0.5000 mg | Freq: Once | INTRAMUSCULAR | Status: AC | PRN
  Administered 2016-04-21: 0.5 mg via INTRAVENOUS
  Filled 2016-04-21: qty 1

## 2016-04-21 MED ORDER — IRINOTECAN HCL CHEMO INJECTION 100 MG/5ML
180.0000 mg/m2 | Freq: Once | INTRAVENOUS | Status: AC
  Administered 2016-04-21: 340 mg via INTRAVENOUS
  Filled 2016-04-21: qty 15

## 2016-04-21 NOTE — Progress Notes (Signed)
Offers no complaints. Has had some weight loss per pt but has been eating and drinking well.

## 2016-04-22 LAB — CEA: CEA: 55.6 ng/mL — ABNORMAL HIGH (ref 0.0–4.7)

## 2016-04-23 ENCOUNTER — Inpatient Hospital Stay: Payer: Medicare Other

## 2016-04-23 VITALS — BP 128/84 | HR 80 | Resp 18

## 2016-04-23 DIAGNOSIS — C787 Secondary malignant neoplasm of liver and intrahepatic bile duct: Secondary | ICD-10-CM | POA: Diagnosis not present

## 2016-04-23 DIAGNOSIS — C187 Malignant neoplasm of sigmoid colon: Secondary | ICD-10-CM

## 2016-04-23 DIAGNOSIS — Z7689 Persons encountering health services in other specified circumstances: Secondary | ICD-10-CM | POA: Diagnosis not present

## 2016-04-23 DIAGNOSIS — D696 Thrombocytopenia, unspecified: Secondary | ICD-10-CM | POA: Diagnosis not present

## 2016-04-23 DIAGNOSIS — D6481 Anemia due to antineoplastic chemotherapy: Secondary | ICD-10-CM | POA: Diagnosis not present

## 2016-04-23 DIAGNOSIS — Z5112 Encounter for antineoplastic immunotherapy: Secondary | ICD-10-CM | POA: Diagnosis not present

## 2016-04-23 DIAGNOSIS — C801 Malignant (primary) neoplasm, unspecified: Secondary | ICD-10-CM

## 2016-04-23 MED ORDER — SODIUM CHLORIDE 0.9% FLUSH
10.0000 mL | INTRAVENOUS | Status: DC | PRN
  Administered 2016-04-23: 10 mL via INTRAVENOUS
  Filled 2016-04-23: qty 10

## 2016-04-23 MED ORDER — HEPARIN SOD (PORK) LOCK FLUSH 100 UNIT/ML IV SOLN
500.0000 [IU] | Freq: Once | INTRAVENOUS | Status: AC
  Administered 2016-04-23: 500 [IU] via INTRAVENOUS
  Filled 2016-04-23: qty 5

## 2016-04-23 MED ORDER — PEGFILGRASTIM INJECTION 6 MG/0.6ML ~~LOC~~
6.0000 mg | PREFILLED_SYRINGE | Freq: Once | SUBCUTANEOUS | Status: AC
  Administered 2016-04-23: 6 mg via SUBCUTANEOUS
  Filled 2016-04-23: qty 0.6

## 2016-05-02 NOTE — Progress Notes (Signed)
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  INTERVAL HISTORY: Patient returns to clinic today for further evaluation and consideration of cycle 11 of 12 of FOLFIRI plus Avastin. He had multiple loose stools this morning, but this has resolved. He continues to tolerate his treatments well without significant side effects. He currently feels well and is asymptomatic. He has no neurologic complaints. He denies any recent fevers or illnesses. He has a good appetite. He reports mild dyspnea on exertion. He denies any chest pain. He denies any nausea or vomiting. He has no urinary complaints. Patient offers no further specific complaints today.   REVIEW OF SYSTEMS:   Review of Systems  Constitutional: Negative.  Negative for fever, malaise/fatigue and weight loss.  Respiratory: Positive for shortness of breath. Negative for cough and hemoptysis.   Cardiovascular: Negative.  Negative for chest pain and leg swelling.  Gastrointestinal: Positive for diarrhea. Negative for abdominal pain, blood in stool, constipation and melena.  Musculoskeletal: Negative.   Neurological: Negative.  Negative for sensory change and weakness.  Psychiatric/Behavioral: Negative.  The patient is not nervous/anxious.     As per HPI. Otherwise, a complete review of systems is negative.  PAST MEDICAL HISTORY: Past Medical History:  Diagnosis Date  . Brain cancer ([place]) 01/2014   Chemotherapy and radiation treatment  . Colon cancer ([place]) 01/2014  . Kidney stones 1975  . Liver cancer ([place])   . Lung cancer [facility]) 2014   Radiation therapy  . Prostate cancer [facility]) 2005   treated by Dr Eliberto Ivory      PAST SURGICAL HISTORY: Past Surgical History:  Procedure Laterality Date  . COLON SURGERY  05-03-14   Resection of Sigmoid Colon and liver biopsy  . kidney stones  1975  . PORTACATH PLACEMENT  2014  . PROSTATE SURGERY  2005   partial removal due to cancer    FAMILY HISTORY: Family History  Problem Relation Age of Onset  . Lung cancer Father   . Alzheimer's disease Mother     ADVANCED DIRECTIVES (Y/N):  N  HEALTH MAINTENANCE: Social History  Substance Use Topics  . Smoking status: Former Smoker    Years: 35.00  . Smokeless tobacco: Never Used  . Alcohol use No     Colonoscopy:  PAP:  Bone density:  Lipid panel:  No Known Allergies  Current Outpatient Prescriptions  Medication Sig Dispense Refill  . HYDROcodone-acetaminophen (NORCO) 5-325 MG tablet Take 1 tablet by mouth every 6 (six) hours as needed for severe pain. 30 tablet 0  . lidocaine (XYLOCAINE) 2 % solution Use as directed 10 mLs in the mouth or throat every 4 (four) hours as needed for mouth pain. 100 mL 0  . magic mouthwash SOLN Take 5 mLs by mouth 4 (four) times daily as needed for mouth pain. Please mix 91m of each ingredient totaling 1821m180 mL 0   No current facility-administered medications for this visit.    Facility-Administered Medications Ordered in Other Visits  Medication Dose Route Frequency Provider Last Rate Last Dose  . sodium chloride 0.9 % injection 10 mL  10 mL Intracatheter PRN JaForest GleasonMD   10 mL at 07/10/14 1428    OBJECTIVE: Vitals:  05/04/16 0907  BP: 139/90  Pulse: 99  Resp: 18  Temp: (!) 96.5 F (35.8 C)     Body mass index is 18.39 kg/m.    ECOG FS:1 - Symptomatic but completely ambulatory  General: Well-developed, well-nourished, no acute distress. Eyes: Pink conjunctiva, anicteric sclera. Lungs: Clear to auscultation bilaterally. Heart: Regular rate and rhythm. No rubs, murmurs, or gallops. Abdomen: Soft, nontender, nondistended. No organomegaly noted,  normoactive bowel sounds. Musculoskeletal: No edema, cyanosis, or clubbing. Neuro: Alert, answering all questions appropriately. Cranial nerves grossly intact. Skin: No rashes or petechiae noted. Psych: Normal affect.   LAB RESULTS:  Lab Results  Component Value Date   NA 138 05/04/2016   K 3.4 (L) 05/04/2016   CL 107 05/04/2016   CO2 28 05/04/2016   GLUCOSE 100 (H) 05/04/2016   BUN 11 05/04/2016   CREATININE 0.74 05/04/2016   CALCIUM 9.5 05/04/2016   PROT 7.1 05/04/2016   ALBUMIN 4.1 05/04/2016   AST 19 05/04/2016   ALT 13 (L) 05/04/2016   ALKPHOS 103 05/04/2016   BILITOT 0.5 05/04/2016   GFRNONAA >60 05/04/2016   GFRAA >60 05/04/2016    Lab Results  Component Value Date   WBC 10.5 05/04/2016   NEUTROABS 7.7 (H) 05/04/2016   HGB 10.3 (L) 05/04/2016   HCT 30.0 (L) 05/04/2016   MCV 98.4 05/04/2016   PLT 119 (L) 05/04/2016   Lab Results  Component Value Date   CEA 55.6 (H) 04/21/2016     STUDIES: No results found. 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 He received carboplatinum and etoposide along with XRT completing in approximately January 2015. Patient had a recurrence in his liver in September 2015 and was reinitiated on treatment with carboplatinum and etoposide again. He was then diagnosed with adenocarcinoma of the sigmoid colon with biopsy-proven metastasis to the liver in March 2016. K-ras wild-type. Patient initiated FOLFOX and Vectibix in April 2016. Patient was found to have a complete response, therefore was switched to Vectibix maintenance therapy in February 2017. Patient was then found to have progressive disease on a PET scan in May 2017 and was subsequently switched to [facility]. PET scan on December 08, 2015 revealed progressive disease and patient started FOLFIRI on December 15, 2015.   ASSESSMENT: Stage IV adenocarcinoma of the sigmoid colon with metastasis to the  liver.  PLAN:  1. 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 CT scan results reviewed independently with essentially stable disease in patient's liver. CEA appears to have plateaued. Proceed with cycle 11 of FOLFIRI + Avastin every 2 weeks. Return to clinic in 2 days for pump removal and Neulasta and then in 2 weeks for labs and consideration of cycle 12. Plan to reimage at the conclusion of cycle 12. 2. Anemia: Mild, secondary treatment. Monitor. 3. Thrombocytopenia: Mild, monitor. 4. History of stage IV small cell lung cancer: No obvious evidence of recurrence. Will continue to monitor with routine imaging. 5. Neutropenia: Currently within normal limits. Neulasta with the remainder of cycles. 6. Mouth pain: Continue hydrocodone as needed. 7. Diarrhea: Continue OTC lomotil as needed.  Patient expressed understanding and was in agreement with this plan. He also understands that He can call clinic at any time with any questions, concerns, or complaints.   Cancer Staging Carcinoma of sigmoid colon [facility]) Staging form: Colon and Rectum, AJCC 7th Edition - Clinical stage  from 02/27/2016: Stage IVB (yT3, N1a, M1b) - Signed by Lloyd Huger, MD on 02/27/2016   Lloyd Huger, MD   05/04/2016 9:38 AM

## 2016-05-04 ENCOUNTER — Inpatient Hospital Stay: Payer: Medicare Other

## 2016-05-04 ENCOUNTER — Inpatient Hospital Stay (HOSPITAL_BASED_OUTPATIENT_CLINIC_OR_DEPARTMENT_OTHER): Payer: Medicare Other | Admitting: Oncology

## 2016-05-04 ENCOUNTER — Encounter: Payer: Self-pay | Admitting: Oncology

## 2016-05-04 VITALS — BP 139/90 | HR 99 | Temp 96.5°F | Resp 18 | Ht 74.0 in | Wt 143.2 lb

## 2016-05-04 DIAGNOSIS — C187 Malignant neoplasm of sigmoid colon: Secondary | ICD-10-CM

## 2016-05-04 DIAGNOSIS — Z87442 Personal history of urinary calculi: Secondary | ICD-10-CM

## 2016-05-04 DIAGNOSIS — Z85118 Personal history of other malignant neoplasm of bronchus and lung: Secondary | ICD-10-CM | POA: Diagnosis not present

## 2016-05-04 DIAGNOSIS — Z923 Personal history of irradiation: Secondary | ICD-10-CM

## 2016-05-04 DIAGNOSIS — Z85841 Personal history of malignant neoplasm of brain: Secondary | ICD-10-CM | POA: Diagnosis not present

## 2016-05-04 DIAGNOSIS — Z801 Family history of malignant neoplasm of trachea, bronchus and lung: Secondary | ICD-10-CM

## 2016-05-04 DIAGNOSIS — Z79899 Other long term (current) drug therapy: Secondary | ICD-10-CM

## 2016-05-04 DIAGNOSIS — R197 Diarrhea, unspecified: Secondary | ICD-10-CM

## 2016-05-04 DIAGNOSIS — T451X5S Adverse effect of antineoplastic and immunosuppressive drugs, sequela: Secondary | ICD-10-CM | POA: Diagnosis not present

## 2016-05-04 DIAGNOSIS — C787 Secondary malignant neoplasm of liver and intrahepatic bile duct: Secondary | ICD-10-CM

## 2016-05-04 DIAGNOSIS — Z7689 Persons encountering health services in other specified circumstances: Secondary | ICD-10-CM | POA: Diagnosis not present

## 2016-05-04 DIAGNOSIS — D696 Thrombocytopenia, unspecified: Secondary | ICD-10-CM | POA: Diagnosis not present

## 2016-05-04 DIAGNOSIS — Z9221 Personal history of antineoplastic chemotherapy: Secondary | ICD-10-CM | POA: Diagnosis not present

## 2016-05-04 DIAGNOSIS — D6481 Anemia due to antineoplastic chemotherapy: Secondary | ICD-10-CM

## 2016-05-04 DIAGNOSIS — Z8546 Personal history of malignant neoplasm of prostate: Secondary | ICD-10-CM

## 2016-05-04 DIAGNOSIS — R0609 Other forms of dyspnea: Secondary | ICD-10-CM | POA: Diagnosis not present

## 2016-05-04 DIAGNOSIS — Z5112 Encounter for antineoplastic immunotherapy: Secondary | ICD-10-CM | POA: Diagnosis not present

## 2016-05-04 DIAGNOSIS — Z87891 Personal history of nicotine dependence: Secondary | ICD-10-CM

## 2016-05-04 LAB — COMPREHENSIVE METABOLIC PANEL
ALBUMIN: 4.1 g/dL (ref 3.5–5.0)
ALK PHOS: 103 U/L (ref 38–126)
ALT: 13 U/L — ABNORMAL LOW (ref 17–63)
ANION GAP: 3 — AB (ref 5–15)
AST: 19 U/L (ref 15–41)
BILIRUBIN TOTAL: 0.5 mg/dL (ref 0.3–1.2)
BUN: 11 mg/dL (ref 6–20)
CALCIUM: 9.5 mg/dL (ref 8.9–10.3)
CO2: 28 mmol/L (ref 22–32)
Chloride: 107 mmol/L (ref 101–111)
Creatinine, Ser: 0.74 mg/dL (ref 0.61–1.24)
GFR calc Af Amer: >60 mL/min (ref >60)
GFR calc non Af Amer: >60 mL/min (ref >60)
GLUCOSE: 100 mg/dL — AB (ref 65–99)
Potassium: 3.4 mmol/L — ABNORMAL LOW (ref 3.5–5.1)
Sodium: 138 mmol/L (ref 135–145)
TOTAL PROTEIN: 7.1 g/dL (ref 6.5–8.1)

## 2016-05-04 LAB — CBC WITH DIFFERENTIAL/PLATELET
Basophils Absolute: 0 10*3/uL (ref 0–0.1)
Basophils Relative: 0 %
Eosinophils Absolute: 0.1 10*3/uL (ref 0–0.7)
Eosinophils Relative: 1 %
HEMATOCRIT: 30 % — AB (ref 40.0–52.0)
HEMOGLOBIN: 10.3 g/dL — AB (ref 13.0–18.0)
LYMPHS ABS: 1.9 10*3/uL (ref 1.0–3.6)
LYMPHS PCT: 18 %
MCH: 33.6 pg (ref 26.0–34.0)
MCHC: 34.2 g/dL (ref 32.0–36.0)
MCV: 98.4 fL (ref 80.0–100.0)
MONOS PCT: 8 %
Monocytes Absolute: 0.8 10*3/uL (ref 0.2–1.0)
NEUTROS ABS: 7.7 10*3/uL — AB (ref 1.4–6.5)
NEUTROS PCT: 73 %
Platelets: 119 10*3/uL — ABNORMAL LOW (ref 150–440)
RBC: 3.05 MIL/uL — ABNORMAL LOW (ref 4.40–5.90)
RDW: 18 % — ABNORMAL HIGH (ref 11.5–14.5)
WBC: 10.5 10*3/uL (ref 3.8–10.6)

## 2016-05-04 LAB — PROTEIN, URINE, RANDOM: Total Protein, Urine: <6 mg/dL

## 2016-05-04 MED ORDER — SODIUM CHLORIDE 0.9% FLUSH
10.0000 mL | INTRAVENOUS | Status: DC | PRN
  Filled 2016-05-04: qty 10

## 2016-05-04 MED ORDER — DEXTROSE 5 % IV SOLN
180.0000 mg/m2 | Freq: Once | INTRAVENOUS | Status: AC
  Administered 2016-05-04: 340 mg via INTRAVENOUS
  Filled 2016-05-04: qty 15

## 2016-05-04 MED ORDER — SODIUM CHLORIDE 0.9 % IV SOLN
2400.0000 mg/m2 | INTRAVENOUS | Status: DC
  Administered 2016-05-04: 4450 mg via INTRAVENOUS
  Filled 2016-05-04: qty 89

## 2016-05-04 MED ORDER — PALONOSETRON HCL INJECTION 0.25 MG/5ML
0.2500 mg | Freq: Once | INTRAVENOUS | Status: AC
  Administered 2016-05-04: 0.25 mg via INTRAVENOUS
  Filled 2016-05-04: qty 5

## 2016-05-04 MED ORDER — LEUCOVORIN CALCIUM INJECTION 350 MG
750.0000 mg | Freq: Once | INTRAVENOUS | Status: AC
  Administered 2016-05-04: 750 mg via INTRAVENOUS
  Filled 2016-05-04: qty 35

## 2016-05-04 MED ORDER — DEXAMETHASONE SODIUM PHOSPHATE 10 MG/ML IJ SOLN
10.0000 mg | Freq: Once | INTRAMUSCULAR | Status: AC
  Administered 2016-05-04: 10 mg via INTRAVENOUS
  Filled 2016-05-04: qty 1

## 2016-05-04 MED ORDER — FLUOROURACIL CHEMO INJECTION 2.5 GM/50ML
400.0000 mg/m2 | Freq: Once | INTRAVENOUS | Status: AC
Start: 2016-05-04 — End: 2016-05-04
  Administered 2016-05-04: 750 mg via INTRAVENOUS
  Filled 2016-05-04: qty 15

## 2016-05-04 MED ORDER — ATROPINE SULFATE 1 MG/ML IJ SOLN
0.5000 mg | Freq: Once | INTRAMUSCULAR | Status: AC | PRN
  Administered 2016-05-04: 0.5 mg via INTRAVENOUS
  Filled 2016-05-04: qty 1

## 2016-05-04 MED ORDER — BEVACIZUMAB CHEMO INJECTION 400 MG/16ML
300.0000 mg | Freq: Once | INTRAVENOUS | Status: AC
  Administered 2016-05-04: 300 mg via INTRAVENOUS
  Filled 2016-05-04: qty 12

## 2016-05-04 MED ORDER — HEPARIN SOD (PORK) LOCK FLUSH 100 UNIT/ML IV SOLN: 500.0000 [IU] | Freq: Once | INTRAVENOUS | Status: DC | PRN

## 2016-05-04 MED ORDER — SODIUM CHLORIDE 0.9 % IV SOLN
Freq: Once | INTRAVENOUS | Status: AC
  Administered 2016-05-04: 10:00:00 via INTRAVENOUS
  Filled 2016-05-04: qty 1000

## 2016-05-04 NOTE — Progress Notes (Signed)
Pt states developed diarrhea this am and had 4 loose stools. Instructed him to take imodium. No appetite. Taking boost encouraged them to drink 3-4 a day

## 2016-05-05 LAB — CEA: CEA: 59.5 ng/mL — AB (ref 0.0–4.7)

## 2016-05-06 ENCOUNTER — Inpatient Hospital Stay: Payer: Medicare Other

## 2016-05-06 DIAGNOSIS — Z7689 Persons encountering health services in other specified circumstances: Secondary | ICD-10-CM | POA: Diagnosis not present

## 2016-05-06 DIAGNOSIS — D696 Thrombocytopenia, unspecified: Secondary | ICD-10-CM | POA: Diagnosis not present

## 2016-05-06 DIAGNOSIS — Z5112 Encounter for antineoplastic immunotherapy: Secondary | ICD-10-CM | POA: Diagnosis not present

## 2016-05-06 DIAGNOSIS — C187 Malignant neoplasm of sigmoid colon: Secondary | ICD-10-CM | POA: Diagnosis not present

## 2016-05-06 DIAGNOSIS — D6481 Anemia due to antineoplastic chemotherapy: Secondary | ICD-10-CM | POA: Diagnosis not present

## 2016-05-06 DIAGNOSIS — C787 Secondary malignant neoplasm of liver and intrahepatic bile duct: Secondary | ICD-10-CM | POA: Diagnosis not present

## 2016-05-06 MED ORDER — HEPARIN SOD (PORK) LOCK FLUSH 100 UNIT/ML IV SOLN
INTRAVENOUS | Status: AC
  Filled 2016-05-06: qty 5

## 2016-05-06 MED ORDER — HEPARIN SOD (PORK) LOCK FLUSH 100 UNIT/ML IV SOLN
500.0000 [IU] | Freq: Once | INTRAVENOUS | Status: AC | PRN
  Administered 2016-05-06: 500 [IU]

## 2016-05-06 MED ORDER — PEGFILGRASTIM INJECTION 6 MG/0.6ML ~~LOC~~
6.0000 mg | PREFILLED_SYRINGE | Freq: Once | SUBCUTANEOUS | Status: AC
  Administered 2016-05-06: 6 mg via SUBCUTANEOUS
  Filled 2016-05-06: qty 0.6

## 2016-05-06 NOTE — Progress Notes (Signed)
Nutrition Assessment   Reason for Assessment:   MD referral for weight loss.  ASSESSMENT:  76 year old male with stage IV adenocarcinoma of sigmoid colon with mets to the liver.  Past medical history of brain cancer, prostate cancer.   Patient seen after pump removal in infusion this pm with wife at chairside.  Patient reports that he typically eats 2 meals per day and has been drinking 1 boost original per day.  Reports that he normally eats oatmeal for breakfast then has a boost later on in the day and likes to go to [employer] but only gets vegetables that lack protein.  Reports has not really wanted meat lately.    Reports some dry mouth.  Reports bowels move regularly.  Asked about nausea and patient reports his body tells him what to eat to not get nauseated.   Nutrition Focused Physical Exam: deferred at this time  Medications: reviewed  Labs: K 3.4, glucose 100  Anthropometrics:   Height: 74 inches Weight: 143 lb 3.2 oz UBW: 158 pounds awhile ago.  Noted 152 pounds on 2/7. 180 pounds 5 years ago. BMI: 18  6% weight loss in the last month and half   Estimated Energy Needs  Kcals: 5284-1324 calories/d Protein: 78-98 g/d Fluid: 2.2 L/d  NUTRITION DIAGNOSIS: Unintentional weight loss related to cancer and cancer related treatments as evidenced by eating < 75% of energy intake for > 7 days and 6% weight loss in the last month and half   MALNUTRITION DIAGNOSIS: Patient meets criteria for moderate malnutrition in context of acute illness as evidence of 6% weight loss in last month and half and eating < 75% of energy intake for > 7 days   INTERVENTION:   Discussed ways to increase calories and protein with patient and wife.  Fact sheet given.  Teach back used. Discussed calories between oral nutrition supplement.  Recommend boost plus or equivalent 2-3 times per day. Encouraged small frequent meals with added calories and protein.   Samples and coupons given of boost  products.    MONITORING, EVALUATION, GOAL: Patient will consume adequate calories and protein to meet nutritional needs and prevent further weight loss   NEXT VISIT: May 3rd  Braylie Badami B. Zenia Resides, [place], [place] Registered Dietitian [phone] (pager)

## 2016-05-10 ENCOUNTER — Other Ambulatory Visit: Payer: Self-pay

## 2016-05-10 ENCOUNTER — Emergency Department: Payer: Medicare Other

## 2016-05-10 ENCOUNTER — Inpatient Hospital Stay
Admission: EM | Admit: 2016-05-10 | Discharge: 2016-05-13 | DRG: 871 | Disposition: A | Discharge status: Home / Self Care | Payer: Medicare Other | Attending: Internal Medicine | Admitting: Internal Medicine

## 2016-05-10 DIAGNOSIS — C7931 Secondary malignant neoplasm of brain: Secondary | ICD-10-CM | POA: Diagnosis present

## 2016-05-10 DIAGNOSIS — Z8546 Personal history of malignant neoplasm of prostate: Secondary | ICD-10-CM

## 2016-05-10 DIAGNOSIS — Z801 Family history of malignant neoplasm of trachea, bronchus and lung: Secondary | ICD-10-CM | POA: Diagnosis not present

## 2016-05-10 DIAGNOSIS — T451X5A Adverse effect of antineoplastic and immunosuppressive drugs, initial encounter: Secondary | ICD-10-CM | POA: Diagnosis present

## 2016-05-10 DIAGNOSIS — C187 Malignant neoplasm of sigmoid colon: Secondary | ICD-10-CM | POA: Diagnosis not present

## 2016-05-10 DIAGNOSIS — Z95828 Presence of other vascular implants and grafts: Secondary | ICD-10-CM

## 2016-05-10 DIAGNOSIS — Y95 Nosocomial condition: Secondary | ICD-10-CM | POA: Diagnosis present

## 2016-05-10 DIAGNOSIS — J189 Pneumonia, unspecified organism: Secondary | ICD-10-CM | POA: Diagnosis present

## 2016-05-10 DIAGNOSIS — R0602 Shortness of breath: Secondary | ICD-10-CM | POA: Diagnosis not present

## 2016-05-10 DIAGNOSIS — Z923 Personal history of irradiation: Secondary | ICD-10-CM

## 2016-05-10 DIAGNOSIS — J188 Other pneumonia, unspecified organism: Secondary | ICD-10-CM | POA: Diagnosis not present

## 2016-05-10 DIAGNOSIS — D696 Thrombocytopenia, unspecified: Secondary | ICD-10-CM

## 2016-05-10 DIAGNOSIS — C3490 Malignant neoplasm of unspecified part of unspecified bronchus or lung: Secondary | ICD-10-CM | POA: Diagnosis present

## 2016-05-10 DIAGNOSIS — D6481 Anemia due to antineoplastic chemotherapy: Secondary | ICD-10-CM | POA: Diagnosis not present

## 2016-05-10 DIAGNOSIS — R000 Tachycardia, unspecified: Secondary | ICD-10-CM | POA: Diagnosis present

## 2016-05-10 DIAGNOSIS — J9601 Acute respiratory failure with hypoxia: Secondary | ICD-10-CM | POA: Diagnosis present

## 2016-05-10 DIAGNOSIS — A419 Sepsis, unspecified organism: Principal | ICD-10-CM | POA: Diagnosis present

## 2016-05-10 DIAGNOSIS — Z87891 Personal history of nicotine dependence: Secondary | ICD-10-CM

## 2016-05-10 DIAGNOSIS — R531 Weakness: Secondary | ICD-10-CM | POA: Diagnosis not present

## 2016-05-10 DIAGNOSIS — D638 Anemia in other chronic diseases classified elsewhere: Secondary | ICD-10-CM | POA: Diagnosis present

## 2016-05-10 DIAGNOSIS — Z681 Body mass index (BMI) 19 or less, adult: Secondary | ICD-10-CM

## 2016-05-10 DIAGNOSIS — C787 Secondary malignant neoplasm of liver and intrahepatic bile duct: Secondary | ICD-10-CM | POA: Diagnosis not present

## 2016-05-10 DIAGNOSIS — R0600 Dyspnea, unspecified: Secondary | ICD-10-CM

## 2016-05-10 DIAGNOSIS — E876 Hypokalemia: Secondary | ICD-10-CM | POA: Diagnosis present

## 2016-05-10 DIAGNOSIS — R05 Cough: Secondary | ICD-10-CM | POA: Diagnosis not present

## 2016-05-10 DIAGNOSIS — Z9221 Personal history of antineoplastic chemotherapy: Secondary | ICD-10-CM | POA: Diagnosis not present

## 2016-05-10 DIAGNOSIS — E43 Unspecified severe protein-calorie malnutrition: Secondary | ICD-10-CM | POA: Insufficient documentation

## 2016-05-10 DIAGNOSIS — T451X5S Adverse effect of antineoplastic and immunosuppressive drugs, sequela: Secondary | ICD-10-CM | POA: Diagnosis not present

## 2016-05-10 DIAGNOSIS — Z79899 Other long term (current) drug therapy: Secondary | ICD-10-CM | POA: Diagnosis not present

## 2016-05-10 LAB — CBC WITH DIFFERENTIAL/PLATELET
BASOS PCT: 0 %
Band Neutrophils: 15 %
Basophils Absolute: 0 10*3/uL (ref 0–0.1)
Blasts: 0 %
EOS PCT: 0 %
Eosinophils Absolute: 0 10*3/uL (ref 0–0.7)
HEMATOCRIT: 29.2 % — AB (ref 40.0–52.0)
Hemoglobin: 9.8 g/dL — ABNORMAL LOW (ref 13.0–18.0)
LYMPHS ABS: 0.8 10*3/uL — AB (ref 1.0–3.6)
LYMPHS PCT: 17 %
MCH: 33.2 pg (ref 26.0–34.0)
MCHC: 33.6 g/dL (ref 32.0–36.0)
MCV: 98.9 fL (ref 80.0–100.0)
MONO ABS: 0.3 10*3/uL (ref 0.2–1.0)
MONOS PCT: 6 %
Metamyelocytes Relative: 6 %
Myelocytes: 0 %
NEUTROS PCT: 52 %
NRBC: 0 /100{WBCs}
Neutro Abs: 3.4 10*3/uL (ref 1.4–6.5)
OTHER: 4 %
PLATELETS: 56 10*3/uL — AB (ref 150–440)
Promyelocytes Absolute: 0 %
RBC: 2.95 MIL/uL — AB (ref 4.40–5.90)
RDW: 17.5 % — AB (ref 11.5–14.5)
WBC: 4.7 10*3/uL (ref 3.8–10.6)

## 2016-05-10 LAB — PATHOLOGIST SMEAR REVIEW

## 2016-05-10 LAB — APTT: aPTT: 37 seconds — ABNORMAL HIGH (ref 24–36)

## 2016-05-10 LAB — COMPREHENSIVE METABOLIC PANEL
ALBUMIN: 3.7 g/dL (ref 3.5–5.0)
ALK PHOS: 84 U/L (ref 38–126)
ALT: 15 U/L — AB (ref 17–63)
ANION GAP: 7 (ref 5–15)
AST: 21 U/L (ref 15–41)
BILIRUBIN TOTAL: 2.1 mg/dL — AB (ref 0.3–1.2)
BUN: 12 mg/dL (ref 6–20)
CALCIUM: 8.9 mg/dL (ref 8.9–10.3)
CO2: 23 mmol/L (ref 22–32)
CREATININE: 0.79 mg/dL (ref 0.61–1.24)
Chloride: 105 mmol/L (ref 101–111)
GFR calc non Af Amer: >60 mL/min (ref >60)
GLUCOSE: 104 mg/dL — AB (ref 65–99)
Potassium: 3.4 mmol/L — ABNORMAL LOW (ref 3.5–5.1)
Sodium: 135 mmol/L (ref 135–145)
TOTAL PROTEIN: 7.2 g/dL (ref 6.5–8.1)

## 2016-05-10 LAB — TROPONIN I: Troponin I: <0.03 ng/mL (ref <0.03)

## 2016-05-10 LAB — PROTIME-INR
INR: 1.41
PROTHROMBIN TIME: 17.4 s — AB (ref 11.4–15.2)

## 2016-05-10 LAB — LACTIC ACID, PLASMA: LACTIC ACID, VENOUS: 1 mmol/L (ref 0.5–1.9)

## 2016-05-10 LAB — BRAIN NATRIURETIC PEPTIDE: B Natriuretic Peptide: 127 pg/mL — ABNORMAL HIGH (ref 0.0–100.0)

## 2016-05-10 LAB — PROCALCITONIN: PROCALCITONIN: 6.56 ng/mL

## 2016-05-10 MED ORDER — DEXTROSE 5 % IV SOLN
1.0000 g | Freq: Every day | INTRAVENOUS | Status: DC
  Administered 2016-05-11: 1 g via INTRAVENOUS
  Filled 2016-05-10 (×2): qty 10

## 2016-05-10 MED ORDER — ALBUTEROL SULFATE (2.5 MG/3ML) 0.083% IN NEBU: 2.5000 mg | INHALATION_SOLUTION | RESPIRATORY_TRACT | Status: DC | PRN

## 2016-05-10 MED ORDER — LEVOFLOXACIN 750 MG PO TABS: 750.0000 mg | ORAL_TABLET | Freq: Every day | ORAL | 0 refills | Status: DC

## 2016-05-10 MED ORDER — CHLORHEXIDINE GLUCONATE 0.12 % MT SOLN
15.0000 mL | Freq: Two times a day (BID) | OROMUCOSAL | Status: DC
  Administered 2016-05-10 – 2016-05-12 (×4): 15 mL via OROMUCOSAL
  Filled 2016-05-10 (×3): qty 15

## 2016-05-10 MED ORDER — GUAIFENESIN 100 MG/5ML PO SOLN: 5.0000 mL | ORAL | Status: DC | PRN

## 2016-05-10 MED ORDER — SODIUM CHLORIDE 0.9 % IV SOLN
INTRAVENOUS | Status: DC
  Administered 2016-05-10 – 2016-05-13 (×4): via INTRAVENOUS

## 2016-05-10 MED ORDER — IOPAMIDOL (ISOVUE-370) INJECTION 76%
75.0000 mL | Freq: Once | INTRAVENOUS | Status: AC | PRN
Start: 2016-05-10 — End: 2016-05-10
  Administered 2016-05-10: 75 mL via INTRAVENOUS

## 2016-05-10 MED ORDER — SODIUM CHLORIDE 0.9 % IV SOLN: 250.0000 mL | INTRAVENOUS | Status: DC | PRN

## 2016-05-10 MED ORDER — SENNOSIDES-DOCUSATE SODIUM 8.6-50 MG PO TABS: 1.0000 | ORAL_TABLET | Freq: Every evening | ORAL | Status: DC | PRN

## 2016-05-10 MED ORDER — SODIUM CHLORIDE 0.9% FLUSH
3.0000 mL | Freq: Two times a day (BID) | INTRAVENOUS | Status: DC
  Administered 2016-05-12 (×2): 3 mL via INTRAVENOUS

## 2016-05-10 MED ORDER — SODIUM CHLORIDE 0.9% FLUSH
3.0000 mL | Freq: Two times a day (BID) | INTRAVENOUS | Status: DC
  Administered 2016-05-12 (×2): 3 mL via INTRAVENOUS

## 2016-05-10 MED ORDER — LEVOFLOXACIN 750 MG PO TABS
750.0000 mg | ORAL_TABLET | Freq: Once | ORAL | Status: AC
  Administered 2016-05-10: 750 mg via ORAL
  Filled 2016-05-10: qty 1

## 2016-05-10 MED ORDER — ACETAMINOPHEN 325 MG PO TABS: 650.0000 mg | ORAL_TABLET | Freq: Four times a day (QID) | ORAL | Status: DC | PRN

## 2016-05-10 MED ORDER — OXYMETAZOLINE HCL 0.05 % NA SOLN
1.0000 | Freq: Once | NASAL | Status: AC
  Administered 2016-05-10: 1 via NASAL
  Filled 2016-05-10: qty 15

## 2016-05-10 MED ORDER — BISACODYL 5 MG PO TBEC: 5.0000 mg | DELAYED_RELEASE_TABLET | Freq: Every day | ORAL | Status: DC | PRN

## 2016-05-10 MED ORDER — DEXTROSE 5 % IV SOLN
500.0000 mg | Freq: Every day | INTRAVENOUS | Status: DC
  Administered 2016-05-11: 18:00:00 500 mg via INTRAVENOUS
  Filled 2016-05-10 (×3): qty 500

## 2016-05-10 MED ORDER — HYDROCODONE-ACETAMINOPHEN 5-325 MG PO TABS: 1.0000 | ORAL_TABLET | ORAL | Status: DC | PRN

## 2016-05-10 MED ORDER — SODIUM CHLORIDE 0.9% FLUSH: 3.0000 mL | INTRAVENOUS | Status: DC | PRN

## 2016-05-10 MED ORDER — ORAL CARE MOUTH RINSE
15.0000 mL | Freq: Two times a day (BID) | OROMUCOSAL | Status: DC
  Administered 2016-05-12 (×2): 15 mL via OROMUCOSAL

## 2016-05-10 MED ORDER — SODIUM CHLORIDE 0.9 % IV SOLN
Freq: Once | INTRAVENOUS | Status: AC
  Administered 2016-05-10: 11:00:00 via INTRAVENOUS

## 2016-05-10 MED ORDER — KETOROLAC TROMETHAMINE 15 MG/ML IJ SOLN: 15.0000 mg | Freq: Four times a day (QID) | INTRAMUSCULAR | Status: DC | PRN

## 2016-05-10 MED ORDER — IOPAMIDOL (ISOVUE-370) INJECTION 76%
75.0000 mL | Freq: Once | INTRAVENOUS | Status: AC | PRN
  Administered 2016-05-10: 30 mL via INTRAVENOUS

## 2016-05-10 MED ORDER — ACETAMINOPHEN 650 MG RE SUPP: 650.0000 mg | Freq: Four times a day (QID) | RECTAL | Status: DC | PRN

## 2016-05-10 MED ORDER — SODIUM CHLORIDE 0.9 % IV SOLN
Freq: Once | INTRAVENOUS | Status: AC
  Administered 2016-05-10: 14:00:00 via INTRAVENOUS

## 2016-05-10 MED ORDER — ONDANSETRON HCL 4 MG PO TABS: 4.0000 mg | ORAL_TABLET | Freq: Four times a day (QID) | ORAL | Status: DC | PRN

## 2016-05-10 MED ORDER — ONDANSETRON HCL 4 MG/2ML IJ SOLN: 4.0000 mg | Freq: Four times a day (QID) | INTRAMUSCULAR | Status: DC | PRN

## 2016-05-10 NOTE — ED Notes (Signed)
Patient taken to CT scan.

## 2016-05-10 NOTE — Progress Notes (Signed)
Pharmacy Antibiotic Note  Keeghan Bialy is a 76 y.o. male admitted on 05/10/2016 with pneumonia.  Pharmacy has been consulted for ceftriaxone and azithromycin dosing.  Patient received levofloxacin 750 mg in ED today so antibiotics will start tomorrow.  Plan: Azithromycin 500 mg IV daily CTX 1 g IV daily  Height: '6\' 2"'$  (188 cm) Weight: 145 lb (65.8 kg) IBW/kg (Calculated) : 82.2  Temp (24hrs), Avg:97.7 F (36.5 C), Min:97.3 F (36.3 C), Max:98.1 F (36.7 C)   Recent Labs Lab 05/04/16 0838 05/10/16 1040  WBC 10.5 4.7  CREATININE 0.74 0.79    Estimated Creatinine Clearance: 76.5 mL/min (by C-G formula based on SCr of 0.79 mg/dL).    No Known Allergies  Antimicrobials this admission: levofloxacin 3/26 in ED CTX 3/27 >>  Azithromycin 3/27 >>  Dose adjustments this admission:  Microbiology results: 3/26 BCx: Sent  Thank you for allowing pharmacy to be a part of this patient's care.  Lenis Noon, PharmD Clinical Pharmacist 05/10/2016 4:03 PM

## 2016-05-10 NOTE — ED Triage Notes (Signed)
Per pt wife, pt has had increased SOB with cough congestion for the past couple of days. Pt matastic CA.

## 2016-05-10 NOTE — H&P (Signed)
[place] [facility] [place] NAME: [name]    MR#:  [id number]  DATE OF BIRTH:  October 03, 1940  DATE OF ADMISSION:  05/10/2016  PRIMARY CARE PHYSICIAN: Lloyd Huger, MD   REQUESTING/REFERRING PHYSICIAN: Earleen Newport, MD  CHIEF COMPLAINT:   Chief Complaint  Patient presents with  . Shortness of Breath   Cough and shortness of breath. HISTORY OF PRESENT ILLNESS:  [name]  is a 76 y.o. male with a known history of Lung cancer with metastasis, status post chemotherapy last week. He has had the cough and congestion with shortness of breath for the past the couple days. He also complains of fever and chills and fatigue. He was found tachycardia and tachypnea. CT angiogram of the chest did not show any PE but New clustered tree-in-bud ground-glass nodular densities in the posterior right mid and lower lung, likely infectious/ inflammatory. PAST MEDICAL HISTORY:   Past Medical History:  Diagnosis Date  . Brain cancer ([place]) 01/2014   Chemotherapy and radiation treatment  . Colon cancer ([place]) 01/2014  . Kidney stones 1975  . Liver cancer ([place])   . Lung cancer [facility]) 2014   Radiation therapy  . Prostate cancer [facility]) 2005   treated by Dr Eliberto Ivory    PAST SURGICAL HISTORY:   Past Surgical History:  Procedure Laterality Date  . COLON SURGERY  05-03-14   Resection of Sigmoid Colon and liver biopsy  . kidney stones  1975  . PORTACATH PLACEMENT  2014  . PROSTATE SURGERY  2005   partial removal due to cancer    SOCIAL HISTORY:   Social History  Substance Use Topics  . Smoking status: Former Smoker    Years: 35.00  . Smokeless tobacco: Never Used  . Alcohol use No    FAMILY HISTORY:   Family History  Problem Relation Age of Onset  . Lung cancer Father   . Alzheimer's disease Mother     DRUG ALLERGIES:  No Known Allergies  REVIEW OF SYSTEMS:   Review of Systems  Constitutional: Positive for chills, fever and  malaise/fatigue.  HENT: Negative for congestion.   Eyes: Negative for blurred vision and double vision.  Respiratory: Positive for cough and shortness of breath. Negative for hemoptysis, wheezing and stridor.   Cardiovascular: Negative for chest pain and leg swelling.  Gastrointestinal: Negative for abdominal pain, blood in stool, diarrhea, melena, nausea and vomiting.  Genitourinary: Negative for dysuria and hematuria.  Musculoskeletal: Negative for back pain.  Skin: Negative for itching and rash.  Neurological: Positive for weakness. Negative for dizziness, focal weakness and loss of consciousness.  Psychiatric/Behavioral: Negative for depression. The patient is not nervous/anxious.     MEDICATIONS AT HOME:   Prior to Admission medications   Medication Sig Start Date End Date Taking? Authorizing Provider  HYDROcodone-acetaminophen (NORCO) 5-325 MG tablet Take 1 tablet by mouth every 6 (six) hours as needed for severe pain. 04/07/16   Lloyd Huger, MD  levofloxacin (LEVAQUIN) 750 MG tablet Take 1 tablet (750 mg total) by mouth daily. 05/10/16   Earleen Newport, MD  lidocaine (XYLOCAINE) 2 % solution Use as directed 10 mLs in the mouth or throat every 4 (four) hours as needed for mouth pain. 03/19/16   Jami L Hagler, PA-C      VITAL SIGNS:  Blood pressure 139/82, pulse (!) 108, temperature 98.1 F (36.7 C), temperature source Oral, resp. rate (!) 33, height '6\' 2"'$  (1.88 m), weight  145 lb (65.8 kg), SpO2 100 %.  PHYSICAL EXAMINATION:  Physical Exam  GENERAL:  76 y.o.-year-old patient lying in the bed with no acute distress.  EYES: Pupils equal, round, reactive to light and accommodation. No scleral icterus. Extraocular muscles intact.  HEENT: Head atraumatic, normocephalic. Oropharynx and nasopharynx clear.  NECK:  Supple, no jugular venous distention. No thyroid enlargement, no tenderness.  LUNGS: Normal breath sounds bilaterally, no wheezing, Crackles on the right side No use  of accessory muscles of respiration. Port-A-Cath in situ. CARDIOVASCULAR: S1, S2 normal. No murmurs, rubs, or gallops.  ABDOMEN: Soft, nontender, nondistended. Bowel sounds present. No organomegaly or mass.  EXTREMITIES: No pedal edema, cyanosis, or clubbing.  NEUROLOGIC: Cranial nerves II through XII are intact. Muscle strength 5/5 in all extremities. Sensation intact. Gait not checked.  PSYCHIATRIC: The patient is alert and oriented x 3.  SKIN: No obvious rash, lesion, or ulcer.   LABORATORY PANEL:   CBC  Recent Labs Lab 05/10/16 1040  WBC 4.7  HGB 9.8*  HCT 29.2*  PLT 56*   ------------------------------------------------------------------------------------------------------------------  Chemistries   Recent Labs Lab 05/10/16 1040  NA 135  K 3.4*  CL 105  CO2 23  GLUCOSE 104*  BUN 12  CREATININE 0.79  CALCIUM 8.9  AST 21  ALT 15*  ALKPHOS 84  BILITOT 2.1*   ------------------------------------------------------------------------------------------------------------------  Cardiac Enzymes  Recent Labs Lab 05/10/16 1040  TROPONINI <0.03   ------------------------------------------------------------------------------------------------------------------  RADIOLOGY:  Dg Chest 1 View  Result Date: 05/10/2016 CLINICAL DATA:  Worsening shortness of breath and cough for the past 2-3 days. History of multiple cancers. EXAM: CHEST 1 VIEW COMPARISON:  Chest CT 03/22/2016 FINDINGS: The power port is in stable position. The cardiac silhouette, mediastinal and hilar contours are unchanged. Stable dense apical scarring changes on the right side. No new/ acute pulmonary findings. No pleural effusion. The bony thorax is intact. IMPRESSION: Chronic dense right apical scarring changes and pleural thickening but no acute overlying superimposed pulmonary process. Electronically Signed   By: Marijo Sanes M.D.   On: 05/10/2016 11:01   Ct Angio Chest Pe W And/or Wo  Contrast  Result Date: 05/10/2016 CLINICAL DATA:  Shortness of breath, hypoxia. EXAM: CT ANGIOGRAPHY CHEST WITH CONTRAST TECHNIQUE: Multidetector CT imaging of the chest was performed using the standard protocol during bolus administration of intravenous contrast. Multiplanar CT image reconstructions and MIPs were obtained to evaluate the vascular anatomy. CONTRAST:  100 cc Isovue 370 IV COMPARISON:  03/22/2016 FINDINGS: Cardiovascular: Heart is normal size. Aorta is tortuous, non aneurysmal. No filling defects in the pulmonary arteries to suggest pulmonary emboli. Mediastinum/Nodes: No mediastinal, hilar, or axillary adenopathy. Lungs/Pleura: Again noted is heterogeneous collapse and consolidation in the posterior upper right hemithorax with associated bronchiectasis. This is stable since prior study. Volume loss on the right with shift of mediastinal contours to the right, stable. Tree-in-bud clustered nodular densities are noted in the posterior right lower lung, new since prior study. This likely represents small airways disease/bronchiolitis/alveolitis. 11 mm nodule in the posterior left upper lobe is stable. Subpleural nodularity posteriorly in the left lower lobe is stable at 6 mm. No pleural effusions. Upper Abdomen: Numerous calcified lesions throughout the visualized liver are similar to prior study. Scattered low-density areas within the liver also stable. Musculoskeletal: Chest wall soft tissues are unremarkable. No acute bony abnormality. Review of the MIP images confirms the above findings. IMPRESSION: No evidence of pulmonary embolus. Stable post treatment changes in the right hemithorax. New clustered tree-in-bud ground-glass  nodular densities in the posterior right mid and lower lung, likely infectious/ inflammatory. Nodules in the left lung are stable, the largest 11 mm in the posterior left upper lobe. Stable hepatic metastases. Electronically Signed   By: Rolm Baptise M.D.   On: 05/10/2016  13:57      IMPRESSION AND PLAN:   Sepsis due to Pneumonia, CPAP The patient will be admitted to medical floor. Continue antibiotics including Zithromax and Rocephin. Follow-up CBC and cultures. DuoNeb and Robitussin when necessary.  Acute respiratory failure with hypoxia. Try to wean off oxygen DuoNeb when necessary. Hypokalemia. Give potassium and follow-up level. Lung cancer with metastasis, status post chemotherapy. Oncology consult. Thrombocytopenia. Possible due to chemotherapy. Follow-up CBC. Anemia of chronic disease. Stable.  All the records are reviewed and case discussed with ED provider. Management plans discussed with the patient, his wife and they are in agreement.  CODE STATUS: Full code  TOTAL TIME TAKING CARE OF THIS PATIENT: 55 minutes.    Demetrios Loll M.D on 05/10/2016 at 3:03 PM  Between 7am to 6pm - Pager - [phone]  After 6pm go to [email or web address] - Proofreader  [facility] [place] [facility]  Office  [phone]  CC: Primary care physician; Lloyd Huger, MD   Note: This dictation was prepared with Dragon dictation along with smaller phrase technology. Any transcriptional errors that result from this process are unintentional.

## 2016-05-10 NOTE — ED Provider Notes (Addendum)
[facility] Emergency Department Provider Note       Time seen: ----------------------------------------- 10:16 AM on 05/10/2016 -----------------------------------------     I have reviewed the triage vital signs and the nursing notes.   HISTORY   Chief Complaint Shortness of Breath    HPI [name] is a 76 y.o. male who presents to the ED for increased shortness of breath with cough and congestion for the past couple of days. Patient reportedly has a history of metastatic cancer. Wife also states she's had very poor by mouth intake over the last 48 hours. Heart rate was noted to be elevated on arrival. Family reports he has not had tachycardia in the past. 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 He is currently receiving chemotherapy treatment for same. He does complain of increased weakness as well.   Past Medical History:  Diagnosis Date  . Brain cancer ([place]) 01/2014   Chemotherapy and radiation treatment  . Colon cancer ([place]) 01/2014  . Kidney stones 1975  . Liver cancer ([place])   . Lung cancer [facility]) 2014   Radiation therapy  . Prostate cancer [facility]) 2005   treated by Dr Eliberto Ivory    Patient Active Problem List   Diagnosis Date Noted  . Goals of care, counseling/discussion 04/06/2016  . Sepsis ([place]) 11/24/2015  . Metastasis to liver ([place]) 10/05/2015  . 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 COLON SURGERY  05-03-14   Resection of Sigmoid Colon and liver biopsy  . kidney stones  1975  . PORTACATH PLACEMENT  2014  . PROSTATE SURGERY  2005   partial removal due to cancer    Allergies Patient has no known allergies.  Social History Social History  Substance Use Topics  . Smoking status: Former Smoker    Years: 35.00  . Smokeless tobacco: Never Used  . Alcohol use No    Review of Systems Constitutional: Negative for fever. Cardiovascular:  Negative for chest pain. Respiratory: Positive for shortness of breath and cough Gastrointestinal: Negative for abdominal pain, vomiting and diarrhea. Genitourinary: Negative for dysuria. Musculoskeletal: Negative for back pain. Skin: Negative for rash. Neurological: Negative for headaches, positive for generalized weakness  10-point ROS otherwise negative.  ____________________________________________   PHYSICAL EXAM:  VITAL SIGNS: ED Triage Vitals  Enc Vitals Group     BP 05/10/16 1007 (!) 140/94     Pulse Rate 05/10/16 1007 (!) 138     Resp 05/10/16 1007 20     Temp 05/10/16 1007 98.1 F (36.7 C)     Temp Source 05/10/16 1007 Oral     SpO2 05/10/16 1007 100 %     Weight 05/10/16 1008 145 lb (65.8 kg)     Height 05/10/16 1008 '6\' 2"'$  (1.88 m)     Head Circumference --      Peak Flow --      Pain Score 05/10/16 1007 0     Pain Loc --      Pain Edu? --      Excl. in [place]? --     Constitutional: Alert and oriented. Cachectic, mild to moderate distress Eyes: Pale conjunctiva. PERRL. Normal extraocular movements. ENT   Head: Normocephalic and atraumatic.   Nose: Marked bilateral congestion   Mouth/Throat: Mucous membranes are moist.   Neck: No stridor. Cardiovascular: Rapid rate, regular rhythm. No murmurs, rubs, or gallops. Respiratory: Tachypnea with scattered rhonchi Gastrointestinal: Soft and  nontender. Normal bowel sounds Musculoskeletal: Nontender with normal range of motion in extremities. No lower extremity tenderness nor edema. Neurologic:  Normal speech and language. No gross focal neurologic deficits are appreciated. Generalized weakness, nothing focal Skin:  Skin is warm, dry and intact. No rash noted. Psychiatric: Mood and affect are normal. Speech and behavior are normal.  ____________________________________________  EKG: Interpreted by me. Sinus tachycardia with a rate of 137 bpm, normal PR interval, normal QRS, normal QT, normal  axis.  ____________________________________________  ED COURSE:  Pertinent labs & imaging results that were available during my care of the patient were reviewed by me and considered in my medical decision making (see chart for details). Patient presents for dyspnea and weakness, we will assess with labs and imaging as indicated. Clinical Course as of May 10 1317  Mon May 10, 2016  [address] Path Review: Peripheral blood smear reviewed. Patient recently received chemotherapy for stage 4 colon cancer with neulasta. [JW]    Clinical Course User Index [JW] Earleen Newport, MD   Procedures ____________________________________________   LABS (pertinent positives/negatives)  Labs Reviewed  CBC WITH DIFFERENTIAL/PLATELET - Abnormal; Notable for the following:       Result Value   RBC 2.95 (*)    Hemoglobin 9.8 (*)    HCT 29.2 (*)    RDW 17.5 (*)    Platelets 56 (*)    Lymphs Abs 0.8 (*)    All other components within normal limits  BRAIN NATRIURETIC PEPTIDE - Abnormal; Notable for the following:    B Natriuretic Peptide 127.0 (*)    All other components within normal limits  COMPREHENSIVE METABOLIC PANEL - Abnormal; Notable for the following:    Potassium 3.4 (*)    Glucose, Bld 104 (*)    ALT 15 (*)    Total Bilirubin 2.1 (*)    All other components within normal limits  TROPONIN I  PATHOLOGIST SMEAR REVIEW    RADIOLOGY Images were viewed by me  Chest x-ray IMPRESSION: Chronic dense right apical scarring changes and pleural thickening but no acute overlying superimposed pulmonary process. IMPRESSION: No evidence of pulmonary embolus.  Stable post treatment changes in the right hemithorax. New clustered tree-in-bud ground-glass nodular densities in the posterior right mid and lower lung, likely infectious/ inflammatory.  Nodules in the left lung are stable, the largest 11 mm in the posterior left upper lobe.  Stable hepatic  metastases.  ____________________________________________  FINAL ASSESSMENT AND PLAN  Dyspnea, weakness  Plan: Patient's labs and imaging were dictated above. Patient had presented for Shortness of breath and weakness. Tachycardia improved significantly with saline boluses. CT scan was performed at dictated as above. Indeterminate whether there is acute infection or not, he will be placed on antibiotics and overall he does not appear well enough to go home. He is still tachypnic. I will discuss with the hospitalist for admission.   Earleen Newport, MD   Note: This note was generated in part or whole with voice recognition software. Voice recognition is usually quite accurate but there are transcription errors that can and very often do occur. I apologize for any typographical errors that were not detected and corrected.     Earleen Newport, MD 05/10/16 1402    Earleen Newport, MD 05/10/16 [phone]

## 2016-05-11 DIAGNOSIS — Z923 Personal history of irradiation: Secondary | ICD-10-CM

## 2016-05-11 DIAGNOSIS — C187 Malignant neoplasm of sigmoid colon: Secondary | ICD-10-CM

## 2016-05-11 DIAGNOSIS — Z8546 Personal history of malignant neoplasm of prostate: Secondary | ICD-10-CM

## 2016-05-11 DIAGNOSIS — Z801 Family history of malignant neoplasm of trachea, bronchus and lung: Secondary | ICD-10-CM

## 2016-05-11 DIAGNOSIS — E43 Unspecified severe protein-calorie malnutrition: Secondary | ICD-10-CM | POA: Insufficient documentation

## 2016-05-11 DIAGNOSIS — J188 Other pneumonia, unspecified organism: Secondary | ICD-10-CM

## 2016-05-11 DIAGNOSIS — Z79899 Other long term (current) drug therapy: Secondary | ICD-10-CM

## 2016-05-11 DIAGNOSIS — C787 Secondary malignant neoplasm of liver and intrahepatic bile duct: Secondary | ICD-10-CM

## 2016-05-11 DIAGNOSIS — T451X5S Adverse effect of antineoplastic and immunosuppressive drugs, sequela: Secondary | ICD-10-CM

## 2016-05-11 DIAGNOSIS — D696 Thrombocytopenia, unspecified: Secondary | ICD-10-CM

## 2016-05-11 DIAGNOSIS — D6481 Anemia due to antineoplastic chemotherapy: Secondary | ICD-10-CM

## 2016-05-11 DIAGNOSIS — Z87891 Personal history of nicotine dependence: Secondary | ICD-10-CM

## 2016-05-11 DIAGNOSIS — Z9221 Personal history of antineoplastic chemotherapy: Secondary | ICD-10-CM

## 2016-05-11 LAB — CBC
HCT: 25.7 % — ABNORMAL LOW (ref 40.0–52.0)
Hemoglobin: 8.6 g/dL — ABNORMAL LOW (ref 13.0–18.0)
MCH: 33.4 pg (ref 26.0–34.0)
MCHC: 33.6 g/dL (ref 32.0–36.0)
MCV: 99.4 fL (ref 80.0–100.0)
PLATELETS: 39 10*3/uL — AB (ref 150–440)
RBC: 2.59 MIL/uL — AB (ref 4.40–5.90)
RDW: 17.3 % — AB (ref 11.5–14.5)
WBC: 3.8 10*3/uL (ref 3.8–10.6)

## 2016-05-11 LAB — BASIC METABOLIC PANEL
ANION GAP: 4 — AB (ref 5–15)
BUN: 13 mg/dL (ref 6–20)
CO2: 24 mmol/L (ref 22–32)
Calcium: 8.3 mg/dL — ABNORMAL LOW (ref 8.9–10.3)
Chloride: 108 mmol/L (ref 101–111)
Creatinine, Ser: 0.8 mg/dL (ref 0.61–1.24)
GFR calc Af Amer: >60 mL/min (ref >60)
GFR calc non Af Amer: >60 mL/min (ref >60)
GLUCOSE: 101 mg/dL — AB (ref 65–99)
POTASSIUM: 3.6 mmol/L (ref 3.5–5.1)
Sodium: 136 mmol/L (ref 135–145)

## 2016-05-11 MED ORDER — PREMIER PROTEIN SHAKE
11.0000 [oz_av] | Freq: Two times a day (BID) | ORAL | Status: DC
  Administered 2016-05-11 – 2016-05-13 (×3): 11 [oz_av] via ORAL

## 2016-05-11 MED ORDER — BENZOCAINE 10 % MT GEL
Freq: Three times a day (TID) | OROMUCOSAL | Status: DC | PRN
  Filled 2016-05-11: qty 9.4

## 2016-05-11 MED ORDER — LIDOCAINE VISCOUS 2 % MT SOLN
15.0000 mL | OROMUCOSAL | Status: DC | PRN
  Filled 2016-05-11: qty 15

## 2016-05-11 NOTE — Consult Note (Signed)
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  INTERVAL HISTORY: Patient is a 76 year old male who is undergoing chemotherapy for metastatic colon cancer with his last treatment approximately one week ago. He presented to the emergency room with increasing shortness of breath and found to have community-acquired pneumonia. Currently he feels improved from admission and nearly at his baseline. He has no neurologic complaints. He denies any fevers. He has a fair appetite, but denies weight loss. He denies any chest pain or cough. He has no nausea, vomiting, constipation, or diarrhea. He has no urinary complaints. Patient offers no further specific complaints.  REVIEW OF SYSTEMS:   Review of Systems  Constitutional: Negative.  Negative for fever, malaise/fatigue and weight loss.  Respiratory: Positive for shortness of breath. Negative for cough and hemoptysis.   Cardiovascular: Negative.  Negative for chest pain and leg swelling.  Gastrointestinal: Negative.  Negative for abdominal pain and nausea.  Genitourinary: Negative.   Musculoskeletal: Negative.   Neurological: Negative.  Negative for sensory change and weakness.  Endo/Heme/Allergies: Does not bruise/bleed easily.  Psychiatric/Behavioral: Negative.  The patient is not nervous/anxious.     As per HPI. Otherwise, a complete review of systems is negative.  PAST MEDICAL HISTORY: Past Medical History:  Diagnosis Date  . Brain cancer ([place]) 01/2014   Chemotherapy and radiation treatment  . Colon cancer ([place]) 01/2014  . Kidney stones 1975  . Liver cancer ([place])   . Lung cancer [facility]) 2014   Radiation therapy  . Prostate cancer [facility]) 2005   treated by Dr Eliberto Ivory    PAST SURGICAL HISTORY: Past Surgical History:  Procedure Laterality Date  . COLON SURGERY  05-03-14   Resection of Sigmoid Colon and liver biopsy  . kidney stones  1975  . PORTACATH PLACEMENT  2014  . PROSTATE SURGERY  2005   partial removal due to cancer    FAMILY HISTORY: Family History  Problem Relation Age of Onset  . Lung cancer Father   . Alzheimer's disease Mother     ADVANCED DIRECTIVES (Y/N):  '@ADVDIR'$ @  HEALTH MAINTENANCE: Social History  Substance Use Topics  . Smoking status: Former Smoker    Years: 35.00  . Smokeless tobacco: Never Used  . Alcohol use No     Colonoscopy:  PAP:  Bone density:  Lipid panel:  No Known Allergies  Current Facility-Administered Medications  Medication Dose Route Frequency Provider Last Rate Last Dose  . 0.9 %  sodium chloride infusion  250 mL Intravenous PRN Demetrios Loll, MD      . 0.9 %  sodium chloride infusion   Intravenous Continuous Demetrios Loll, MD 75 mL/hr at 05/11/16 1809    . acetaminophen (TYLENOL) tablet 650 mg  650 mg Oral Q6H PRN Demetrios Loll, MD       Or  . acetaminophen (TYLENOL) suppository 650 mg  650 mg Rectal Q6H PRN Demetrios Loll, MD      . albuterol (PROVENTIL) (2.5 MG/3ML) 0.083% nebulizer solution 2.5 mg  2.5 mg Nebulization Q2H PRN Demetrios Loll, MD      . azithromycin (ZITHROMAX) 500 mg in dextrose 5 % 250 mL IVPB  500 mg Intravenous q1800 Darylene Price Swayne, RPH   500 mg at  05/11/16 1811  . bisacodyl (DULCOLAX) EC tablet 5 mg  5 mg Oral Daily PRN Demetrios Loll, MD      . cefTRIAXone (ROCEPHIN) 1 g in dextrose 5 % 50 mL IVPB  1 g Intravenous q1800 Lenis Noon, RPH   1 g at 05/11/16 1746  . chlorhexidine (PERIDEX) 0.12 % solution 15 mL  15 mL Mouth Rinse BID Demetrios Loll, MD   15 mL at 05/11/16 2141  . guaiFENesin (ROBITUSSIN) 100 MG/5ML solution 100 mg  5 mL Oral Q4H PRN Demetrios Loll, MD      . HYDROcodone-acetaminophen (NORCO/VICODIN) 5-325 MG per tablet 1-2  tablet  1-2 tablet Oral Q4H PRN Demetrios Loll, MD      . ketorolac (TORADOL) 15 MG/ML injection 15 mg  15 mg Intravenous Q6H PRN Demetrios Loll, MD      . lidocaine (XYLOCAINE) 2 % viscous mouth solution 15 mL  15 mL Mouth/Throat Q4H PRN Vaughan Basta, MD      . MEDLINE mouth rinse  15 mL Mouth Rinse q12n4p Demetrios Loll, MD      . ondansetron [facility]) tablet 4 mg  4 mg Oral Q6H PRN Demetrios Loll, MD       Or  . ondansetron [facility]) injection 4 mg  4 mg Intravenous Q6H PRN Demetrios Loll, MD      . protein supplement (PREMIER PROTEIN) liquid  11 oz Oral BID BM Vaughan Basta, MD   11 oz at 05/11/16 1748  . senna-docusate (Senokot-S) tablet 1 tablet  1 tablet Oral QHS PRN Demetrios Loll, MD      . sodium chloride flush (NS) 0.9 % injection 3 mL  3 mL Intravenous Q12H Demetrios Loll, MD      . sodium chloride flush (NS) 0.9 % injection 3 mL  3 mL Intravenous Q12H Demetrios Loll, MD      . sodium chloride flush (NS) 0.9 % injection 3 mL  3 mL Intravenous PRN Demetrios Loll, MD       Facility-Administered Medications Ordered in Other Encounters  Medication Dose Route Frequency Provider Last Rate Last Dose  . sodium chloride 0.9 % injection 10 mL  10 mL Intracatheter PRN [name], MD   10 mL at 07/10/14 1428    OBJECTIVE: Vitals:   05/11/16 1414 05/11/16 2035  BP: 121/82 119/77  Pulse: (!) 102 (!) 120  Resp:  (!) 24  Temp: 98.2 F (36.8 C) 99.2 F (37.3 C)     Body mass index is 17.17 kg/m.    ECOG FS:1 - Symptomatic but completely ambulatory  General: Well-developed, well-nourished, no acute distress. Eyes: Pink conjunctiva, anicteric sclera. HEENT: Normocephalic, moist mucous membranes, clear oropharnyx. Lungs: Clear to auscultation bilaterally. Heart: Regular rate and rhythm. No rubs, murmurs, or gallops. Abdomen: Soft, nontender, nondistended. No organomegaly noted, normoactive bowel sounds. Musculoskeletal: No edema, cyanosis, or clubbing. Neuro: Alert, answering all questions appropriately. Cranial  nerves grossly intact. Skin: No rashes or petechiae noted. Psych: Normal affect. Lymphatics: No cervical, calvicular, axillary or inguinal LAD.   LAB RESULTS:  Lab Results  Component Value Date   NA 136 05/11/2016   K 3.6 05/11/2016   CL 108 05/11/2016   CO2 24 05/11/2016   GLUCOSE 101 (H) 05/11/2016   BUN 13 05/11/2016   CREATININE 0.80 05/11/2016   CALCIUM 8.3 (L) 05/11/2016   PROT 7.2 05/10/2016   ALBUMIN 3.7 05/10/2016   AST 21 05/10/2016   ALT 15 (L) 05/10/2016   ALKPHOS 84 05/10/2016   BILITOT  2.1 (H) 05/10/2016   GFRNONAA >60 05/11/2016   GFRAA >60 05/11/2016    Lab Results  Component Value Date   WBC 3.8 05/11/2016   NEUTROABS 3.4 05/10/2016   HGB 8.6 (L) 05/11/2016   HCT 25.7 (L) 05/11/2016   MCV 99.4 05/11/2016   PLT 39 (L) 05/11/2016     STUDIES: Dg Chest 1 View  Result Date: 05/10/2016 CLINICAL DATA:  Worsening shortness of breath and cough for the past 2-3 days. History of multiple cancers. EXAM: CHEST 1 VIEW COMPARISON:  Chest CT 03/22/2016 FINDINGS: The power port is in stable position. The cardiac silhouette, mediastinal and hilar contours are unchanged. Stable dense apical scarring changes on the right side. No new/ acute pulmonary findings. No pleural effusion. The bony thorax is intact. IMPRESSION: Chronic dense right apical scarring changes and pleural thickening but no acute overlying superimposed pulmonary process. Electronically Signed   By: Marijo Sanes M.D.   On: 05/10/2016 11:01   Ct Angio Chest Pe W And/or Wo Contrast  Result Date: 05/10/2016 CLINICAL DATA:  Shortness of breath, hypoxia. EXAM: CT ANGIOGRAPHY CHEST WITH CONTRAST TECHNIQUE: Multidetector CT imaging of the chest was performed using the standard protocol during bolus administration of intravenous contrast. Multiplanar CT image reconstructions and MIPs were obtained to evaluate the vascular anatomy. CONTRAST:  100 cc Isovue 370 IV COMPARISON:  03/22/2016 FINDINGS: Cardiovascular:  Heart is normal size. Aorta is tortuous, non aneurysmal. No filling defects in the pulmonary arteries to suggest pulmonary emboli. Mediastinum/Nodes: No mediastinal, hilar, or axillary adenopathy. Lungs/Pleura: Again noted is heterogeneous collapse and consolidation in the posterior upper right hemithorax with associated bronchiectasis. This is stable since prior study. Volume loss on the right with shift of mediastinal contours to the right, stable. Tree-in-bud clustered nodular densities are noted in the posterior right lower lung, new since prior study. This likely represents small airways disease/bronchiolitis/alveolitis. 11 mm nodule in the posterior left upper lobe is stable. Subpleural nodularity posteriorly in the left lower lobe is stable at 6 mm. No pleural effusions. Upper Abdomen: Numerous calcified lesions throughout the visualized liver are similar to prior study. Scattered low-density areas within the liver also stable. Musculoskeletal: Chest wall soft tissues are unremarkable. No acute bony abnormality. Review of the MIP images confirms the above findings. IMPRESSION: No evidence of pulmonary embolus. Stable post treatment changes in the right hemithorax. New clustered tree-in-bud ground-glass nodular densities in the posterior right mid and lower lung, likely infectious/ inflammatory. Nodules in the left lung are stable, the largest 11 mm in the posterior left upper lobe. Stable hepatic metastases. Electronically Signed   By: Rolm Baptise M.D.   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  PLAN:    1. 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 He also received Neulasta with treatments. He has been instructed to keep his previously scheduled follow-up appointment in approximately one week for further  evaluation and consideration of continuation of treatment. Plan to reimage at the conclusion of cycle 12. 2. Anemia: Mild, secondary treatment. Monitor. 3. Thrombocytopenia: Patient's platelet count has declined. This is likely secondary to chemotherapy. Monitor, no intervention is needed. 4. Pneumonia: Agree with current antibiotics. 5. Disposition: Okay to discharge from an oncology standpoint in follow-up as above.  Patient expressed understanding and was in agreement with this plan. He also understands that  He can call clinic at any time with any questions, concerns, or complaints.   Cancer Staging Carcinoma of sigmoid colon [facility]) Staging form: Colon and Rectum, AJCC 7th Edition - Clinical stage from 02/27/2016: Stage IVB (yT3, N1a, M1b) - Signed by Lloyd Huger, MD on 02/27/2016   Lloyd Huger, MD   05/11/2016 9:50 PM

## 2016-05-11 NOTE — Progress Notes (Signed)
Initial Nutrition Assessment  DOCUMENTATION CODES:   Severe malnutrition in context of chronic illness  INTERVENTION:  - [employer] forces logistics/support/administrative officer Cup BID, each supplement provides 290 calories and 9 grams protein - Chocolate Premier Protein BID, each supplement provides 160 calories and 30 grams of protein  NUTRITION DIAGNOSIS:   Malnutrition related to chronic illness as evidenced by severe depletion of muscle mass, severe depletion of body fat, percent weight loss.  GOAL:   Patient will meet greater than or equal to 90% of their needs  MONITOR:   PO intake, Supplement acceptance, I & O's, Weight trends, Labs  REASON FOR ASSESSMENT:   Malnutrition Screening Tool    ASSESSMENT:   76 y.o. Male with PMH of lung cancer with mets s/p chemotherapy last week with complaints of cough, congestion, SOB, fever, chills, and fatigue. Pt was found tachy[place]ia and tachypnea. Pt presents with sepsis related to CPAP pneumonia.   Spoke with pt and pt's wife at bedside.  Pt has experienced recent weight loss. Pt reports weighing 150 lbs 2 weeks ago. Per chart he has acute weight loss but also chronic. 9 % weight loss in 3 months - significant for time frame  Wt Readings from Last 10 Encounters:  05/10/16 137 lb 6.4 oz (62.3 kg)  05/04/16 143 lb 3.2 oz (65 kg)  04/21/16 147 lb 14.9 oz (67.1 kg)  04/07/16 149 lb 14.6 oz (68 kg)  03/24/16 152 lb 0.1 oz (68.9 kg)  03/19/16 150 lb (68 kg)  03/10/16 150 lb 14.5 oz (68.5 kg)  02/25/16 152 lb 8.9 oz (69.2 kg)  02/11/16 151 lb 2 oz (68.6 kg)  01/26/16 150 lb 7.4 oz (68.2 kg)   Pt was consuming tray at time of visit, he had roughly consumed less than 1/3 of tray. Pt and pt's wife reports a decrease in appetite and ability to chew stating pepper and spices are irritating.  Pt and wife report meeting with cancer center dietician last week and report they have Boost at home to continue oral nutritional supplementation s/p discharge. Cancer center dietician  plans to follow-up with patient May 3rd  Labs and medications reviewed  Nutrition-focused physical exam completed. Findings include severe fat depletion, severe muscle depletion, and no edema.  Diet Order:  Diet regular Room service appropriate? Yes; Fluid consistency: Thin  Skin:  Reviewed, no issues  Last BM:  3/25  Height:   Ht Readings from Last 1 Encounters:  05/10/16 '6\' 3"'$  (1.905 m)    Weight:   Wt Readings from Last 1 Encounters:  05/10/16 137 lb 6.4 oz (62.3 kg)    Ideal Body Weight:  89 kg  BMI:  Body mass index is 17.17 kg/m.  Estimated Nutritional Needs:   Kcal:  2100-2400  Protein:  93-106 grams  Fluid:  >/= 2.1 L/d  EDUCATION NEEDS:   Education needs no appropriate at this time  [employer]

## 2016-05-11 NOTE — Progress Notes (Signed)
[facility] [place] NAME: [name]    MR#:  [id number]  DATE OF BIRTH:  [birth year]  SUBJECTIVE:  CHIEF COMPLAINT:   Chief Complaint  Patient presents with  . Shortness of Breath    Hx of cancer, recent chemo, came with SOB - found to have Pneumonia. Feels little better today after treatment. Plt count dropped.  REVIEW OF SYSTEMS:  CONSTITUTIONAL: No fever, fatigue or weakness.  EYES: No blurred or double vision.  EARS, NOSE, AND THROAT: No tinnitus or ear pain.  RESPIRATORY: No cough, shortness of breath, wheezing or hemoptysis.  CARDIOVASCULAR: No chest pain, orthopnea, edema.  GASTROINTESTINAL: No nausea, vomiting, diarrhea or abdominal pain.  GENITOURINARY: No dysuria, hematuria.  ENDOCRINE: No polyuria, nocturia,  HEMATOLOGY: No anemia, easy bruising or bleeding SKIN: No rash or lesion. MUSCULOSKELETAL: No joint pain or arthritis.   NEUROLOGIC: No tingling, numbness, weakness.  PSYCHIATRY: No anxiety or depression.   ROS  DRUG ALLERGIES:  No Known Allergies  VITALS:  Blood pressure 119/77, pulse (!) 120, temperature 99.2 F (37.3 C), temperature source Oral, resp. rate (!) 24, height '6\' 3"'$  (1.905 m), weight 62.3 kg (137 lb 6.4 oz), SpO2 98 %.  PHYSICAL EXAMINATION:  GENERAL:  76 y.o.-year-old patient lying in the bed with no acute distress.  EYES: Pupils equal, round, reactive to light and accommodation. No scleral icterus. Extraocular muscles intact.  HEENT: Head atraumatic, normocephalic. Oropharynx and nasopharynx clear.  NECK:  Supple, no jugular venous distention. No thyroid enlargement, no tenderness.  LUNGS: Normal breath sounds bilaterally, no wheezing, some crepitation. No use of accessory muscles of respiration.  CARDIOVASCULAR: S1, S2 normal. No murmurs, rubs, or gallops.  ABDOMEN: Soft, nontender, nondistended. Bowel sounds present. No organomegaly or mass.  EXTREMITIES: No pedal edema, cyanosis, or clubbing.   NEUROLOGIC: Cranial nerves II through XII are intact. Muscle strength 5/5 in all extremities. Sensation intact. Gait not checked.  PSYCHIATRIC: The patient is alert and oriented x 3.  SKIN: No obvious rash, lesion, or ulcer.   Physical Exam LABORATORY PANEL:   CBC  Recent Labs Lab 05/11/16 0813  WBC 3.8  HGB 8.6*  HCT 25.7*  PLT 39*   ------------------------------------------------------------------------------------------------------------------  Chemistries   Recent Labs Lab 05/10/16 1040 05/11/16 0230  NA 135 136  K 3.4* 3.6  CL 105 108  CO2 23 24  GLUCOSE 104* 101*  BUN 12 13  CREATININE 0.79 0.80  CALCIUM 8.9 8.3*  AST 21  --   ALT 15*  --   ALKPHOS 84  --   BILITOT 2.1*  --    ------------------------------------------------------------------------------------------------------------------  Cardiac Enzymes  Recent Labs Lab 05/10/16 1040  TROPONINI <0.03   ------------------------------------------------------------------------------------------------------------------  RADIOLOGY:  Dg Chest 1 View  Result Date: 05/10/2016 CLINICAL DATA:  Worsening shortness of breath and cough for the past 2-3 days. History of multiple cancers. EXAM: CHEST 1 VIEW COMPARISON:  Chest CT 03/22/2016 FINDINGS: The power port is in stable position. The cardiac silhouette, mediastinal and hilar contours are unchanged. Stable dense apical scarring changes on the right side. No new/ acute pulmonary findings. No pleural effusion. The bony thorax is intact. IMPRESSION: Chronic dense right apical scarring changes and pleural thickening but no acute overlying superimposed pulmonary process. Electronically Signed   By: Marijo Sanes M.D.   On: 05/10/2016 11:01   Ct Angio Chest Pe W And/or Wo Contrast  Result Date: 05/10/2016 CLINICAL DATA:  Shortness of breath, hypoxia. EXAM: CT ANGIOGRAPHY CHEST  WITH CONTRAST TECHNIQUE: Multidetector CT imaging of the chest was performed using the  standard protocol during bolus administration of intravenous contrast. Multiplanar CT image reconstructions and MIPs were obtained to evaluate the vascular anatomy. CONTRAST:  100 cc Isovue 370 IV COMPARISON:  03/22/2016 FINDINGS: Cardiovascular: Heart is normal size. Aorta is tortuous, non aneurysmal. No filling defects in the pulmonary arteries to suggest pulmonary emboli. Mediastinum/Nodes: No mediastinal, hilar, or axillary adenopathy. Lungs/Pleura: Again noted is heterogeneous collapse and consolidation in the posterior upper right hemithorax with associated bronchiectasis. This is stable since prior study. Volume loss on the right with shift of mediastinal contours to the right, stable. Tree-in-bud clustered nodular densities are noted in the posterior right lower lung, new since prior study. This likely represents small airways disease/bronchiolitis/alveolitis. 11 mm nodule in the posterior left upper lobe is stable. Subpleural nodularity posteriorly in the left lower lobe is stable at 6 mm. No pleural effusions. Upper Abdomen: Numerous calcified lesions throughout the visualized liver are similar to prior study. Scattered low-density areas within the liver also stable. Musculoskeletal: Chest wall soft tissues are unremarkable. No acute bony abnormality. Review of the MIP images confirms the above findings. IMPRESSION: No evidence of pulmonary embolus. Stable post treatment changes in the right hemithorax. New clustered tree-in-bud ground-glass nodular densities in the posterior right mid and lower lung, likely infectious/ inflammatory. Nodules in the left lung are stable, the largest 11 mm in the posterior left upper lobe. Stable hepatic metastases. Electronically Signed   By: Rolm Baptise M.D.   On: 05/10/2016 13:57    ASSESSMENT AND PLAN:   Active Problems:   Sepsis ([place])   Protein-calorie malnutrition, severe  * Sepsis due to Pneumonia, CPAP   Continue antibiotics including Zithromax and  Rocephin. Follow-up CBC and cultures. DuoNeb and Robitussin when necessary.  * Acute respiratory failure with hypoxia. Try to wean off oxygen DuoNeb when necessary. * Hypokalemia. Give potassium and follow-up level. * Lung cancer with metastasis, status post chemotherapy. Oncology consult. * Thrombocytopenia. Possible due to chemotherapy. Follow-up CBC. * Anemia of chronic disease. Stable.   All the records are reviewed and case discussed with Care Management/Social Workerr. Management plans discussed with the patient, family and they are in agreement.  CODE STATUS: Full.  TOTAL TIME TAKING CARE OF THIS PATIENT: 35 minutes.    POSSIBLE D/C IN 1-2 DAYS, DEPENDING ON CLINICAL CONDITION.   Vaughan Basta M.D on 05/11/2016   Between 7am to 6pm - Pager - [phone]  After 6pm go to [email or web address] - password [email or web address] [facility]  Office  [phone]  CC: Primary care physician; Lloyd Huger, MD  Note: This dictation was prepared with Dragon dictation along with smaller phrase technology. Any transcriptional errors that result from this process are unintentional.

## 2016-05-12 LAB — CBC
HCT: 24.4 % — ABNORMAL LOW (ref 40.0–52.0)
HEMOGLOBIN: 8.2 g/dL — AB (ref 13.0–18.0)
MCH: 33.2 pg (ref 26.0–34.0)
MCHC: 33.5 g/dL (ref 32.0–36.0)
MCV: 99.2 fL (ref 80.0–100.0)
PLATELETS: 30 10*3/uL — AB (ref 150–440)
RBC: 2.46 MIL/uL — AB (ref 4.40–5.90)
RDW: 16.5 % — ABNORMAL HIGH (ref 11.5–14.5)
WBC: 3.2 10*3/uL — ABNORMAL LOW (ref 3.8–10.6)

## 2016-05-12 LAB — EXPECTORATED SPUTUM ASSESSMENT W REFEX TO RESP CULTURE

## 2016-05-12 LAB — EXPECTORATED SPUTUM ASSESSMENT W GRAM STAIN, RFLX TO RESP C: Special Requests: NORMAL

## 2016-05-12 LAB — MRSA PCR SCREENING: MRSA BY PCR: NEGATIVE

## 2016-05-12 MED ORDER — VANCOMYCIN HCL IN DEXTROSE 750-5 MG/150ML-% IV SOLN
750.0000 mg | Freq: Two times a day (BID) | INTRAVENOUS | Status: DC
  Filled 2016-05-12: qty 150

## 2016-05-12 MED ORDER — VANCOMYCIN HCL IN DEXTROSE 1-5 GM/200ML-% IV SOLN
1000.0000 mg | INTRAVENOUS | Status: AC
  Administered 2016-05-12: 15:00:00 1000 mg via INTRAVENOUS
  Filled 2016-05-12: qty 200

## 2016-05-12 MED ORDER — CARVEDILOL 3.125 MG PO TABS
3.1250 mg | ORAL_TABLET | Freq: Two times a day (BID) | ORAL | Status: DC
Start: 2016-05-12 — End: 2016-05-13
  Administered 2016-05-13: 3.125 mg via ORAL
  Filled 2016-05-12: qty 1

## 2016-05-12 MED ORDER — PIPERACILLIN-TAZOBACTAM 3.375 G IVPB
3.3750 g | Freq: Three times a day (TID) | INTRAVENOUS | Status: DC
  Administered 2016-05-12 – 2016-05-13 (×3): 3.375 g via INTRAVENOUS
  Filled 2016-05-12 (×3): qty 50

## 2016-05-12 MED ORDER — VANCOMYCIN HCL IN DEXTROSE 750-5 MG/150ML-% IV SOLN
750.0000 mg | Freq: Two times a day (BID) | INTRAVENOUS | Status: DC
  Administered 2016-05-12 – 2016-05-13 (×2): 750 mg via INTRAVENOUS
  Filled 2016-05-12 (×3): qty 150

## 2016-05-12 NOTE — Progress Notes (Signed)
ANTIBIOTIC CONSULT NOTE - INITIAL  Pharmacy Consult for Vancomycin and Zosyn  Indication: pneumonia  No Known Allergies  Patient Measurements: Height: '6\' 3"'$  (190.5 cm) Weight: 137 lb 6.4 oz (62.3 kg) IBW/kg (Calculated) : 84.5 Adjusted Body Weight:   Vital Signs: Temp: 98 F (36.7 C) (03/28 1120) Temp Source: Oral (03/28 1120) BP: 107/68 (03/28 1120) Pulse Rate: 108 (03/28 1120) Intake/Output from previous day: 03/27 0701 - 03/28 0700 In: 2128.8 [I.V.:1828.8; IV Piggyback:300] Out: 1050 [Urine:1050] Intake/Output from this shift: Total I/O In: 571.3 [I.V.:571.3] Out: -   Labs:  Recent Labs  05/10/16 1040 05/11/16 0230 05/11/16 0813 05/12/16 0428  WBC 4.7  --  3.8 3.2*  HGB 9.8*  --  8.6* 8.2*  PLT 56*  --  39* 30*  CREATININE 0.79 0.80  --   --    Estimated Creatinine Clearance: 72.5 mL/min (by C-G formula based on SCr of 0.8 mg/dL). No results for input(s): VANCOTROUGH, VANCOPEAK, VANCORANDOM, GENTTROUGH, GENTPEAK, GENTRANDOM, TOBRATROUGH, TOBRAPEAK, TOBRARND, AMIKACINPEAK, AMIKACINTROU, AMIKACIN in the last 72 hours.   Microbiology: Recent Results (from the past 720 hour(s))  Culture, blood (x 2)     Status: None (Preliminary result)   Collection Time: 05/10/16  4:34 PM  Result Value Ref Range Status   Specimen Description BLOOD RIGHT ANTECUBITAL  Final   Special Requests   Final    BOTTLES DRAWN AEROBIC AND ANAEROBIC Blood Culture adequate volume   Culture NO GROWTH 2 DAYS  Final   Report Status PENDING  Incomplete  Culture, blood (x 2)     Status: None (Preliminary result)   Collection Time: 05/10/16  4:34 PM  Result Value Ref Range Status   Specimen Description BLOOD LEFT HAND  Final   Special Requests   Final    BOTTLES DRAWN AEROBIC AND ANAEROBIC Blood Culture adequate volume   Culture NO GROWTH 2 DAYS  Final   Report Status PENDING  Incomplete    Medical History: Past Medical History:  Diagnosis Date  . Brain cancer ([place]) 01/2014   Chemotherapy and radiation treatment  . Colon cancer ([place]) 01/2014  . Kidney stones 1975  . Liver cancer ([place])   . Lung cancer [facility]) 2014   Radiation therapy  . Prostate cancer [facility]) 2005   treated by Dr Eliberto Ivory    Medications:  Prescriptions Prior to Admission  Medication Sig Dispense Refill Last Dose  . HYDROcodone-acetaminophen (NORCO) 5-325 MG tablet Take 1 tablet by mouth every 6 (six) hours as needed for severe pain. 30 tablet 0 Taking  . lidocaine (XYLOCAINE) 2 % solution Use as directed 10 mLs in the mouth or throat every 4 (four) hours as needed for mouth pain. (Patient not taking: Reported on 05/10/2016) 100 mL 0 Not Taking at Unknown time   Scheduled:  . carvedilol  3.125 mg Oral BID WC  . chlorhexidine  15 mL Mouth Rinse BID  . mouth rinse  15 mL Mouth Rinse q12n4p  . piperacillin-tazobactam (ZOSYN)  IV  3.375 g Intravenous Q8H  . protein supplement shake  11 oz Oral BID BM  . sodium chloride flush  3 mL Intravenous Q12H  . sodium chloride flush  3 mL Intravenous Q12H  . vancomycin  1,000 mg Intravenous STAT  . vancomycin  750 mg Intravenous Q12H   Assessment: Pharmacy consulted to dose and monitor Vancomycin and Zosyn for HCAP.   Goal of Therapy:  Vancomycin trough level 15-20 mcg/ml  Plan:  Will give Vancomycin 1 g IV x 1.  Will then start Vancomycin 750 mg IV q12 hours 8 hours afterwards.  Zosyn: Zosyn 3.375 g IV q8 hours.   Atom Solivan D 05/12/2016,12:05 PM

## 2016-05-12 NOTE — Progress Notes (Signed)
[facility] [place] NAME: [name]    MR#:  [id number]  DATE OF BIRTH:  [birth year]  SUBJECTIVE:  CHIEF COMPLAINT:   Chief Complaint  Patient presents with  . Shortness of Breath    Hx of cancer, recent chemo, came with SOB - found to have Pneumonia. Feels little better today after treatment. Plt count dropped. Just trying to sit up in bed and he felt excessive SOB.  REVIEW OF SYSTEMS:  CONSTITUTIONAL: No fever, fatigue or weakness.  EYES: No blurred or double vision.  EARS, NOSE, AND THROAT: No tinnitus or ear pain.  RESPIRATORY: No cough, shortness of breath, wheezing or hemoptysis.  CARDIOVASCULAR: No chest pain, orthopnea, edema.  GASTROINTESTINAL: No nausea, vomiting, diarrhea or abdominal pain.  GENITOURINARY: No dysuria, hematuria.  ENDOCRINE: No polyuria, nocturia,  HEMATOLOGY: No anemia, easy bruising or bleeding SKIN: No rash or lesion. MUSCULOSKELETAL: No joint pain or arthritis.   NEUROLOGIC: No tingling, numbness, weakness.  PSYCHIATRY: No anxiety or depression.   ROS  DRUG ALLERGIES:  No Known Allergies  VITALS:  Blood pressure 107/68, pulse (!) 108, temperature 98 F (36.7 C), temperature source Oral, resp. rate 18, height '6\' 3"'$  (1.905 m), weight 62.3 kg (137 lb 6.4 oz), SpO2 97 %.  PHYSICAL EXAMINATION:  GENERAL:  76 y.o.-year-old patient lying in the bed with no acute distress.  EYES: Pupils equal, round, reactive to light and accommodation. No scleral icterus. Extraocular muscles intact.  HEENT: Head atraumatic, normocephalic. Oropharynx and nasopharynx clear.  NECK:  Supple, no jugular venous distention. No thyroid enlargement, no tenderness.  LUNGS: Normal breath sounds bilaterally, no wheezing, some crepitation. Positive for use of accessory muscles of respiration after trying to sit up in bed. CARDIOVASCULAR: S1, S2 normal. No murmurs, rubs, or gallops.  ABDOMEN: Soft, nontender, nondistended. Bowel  sounds present. No organomegaly or mass.  EXTREMITIES: No pedal edema, cyanosis, or clubbing.  NEUROLOGIC: Cranial nerves II through XII are intact. Muscle strength 5/5 in all extremities. Sensation intact. Gait not checked.  PSYCHIATRIC: The patient is alert and oriented x 3.  SKIN: No obvious rash, lesion, or ulcer.   Physical Exam LABORATORY PANEL:   CBC  Recent Labs Lab 05/12/16 0428  WBC 3.2*  HGB 8.2*  HCT 24.4*  PLT 30*   ------------------------------------------------------------------------------------------------------------------  Chemistries   Recent Labs Lab 05/10/16 1040 05/11/16 0230  NA 135 136  K 3.4* 3.6  CL 105 108  CO2 23 24  GLUCOSE 104* 101*  BUN 12 13  CREATININE 0.79 0.80  CALCIUM 8.9 8.3*  AST 21  --   ALT 15*  --   ALKPHOS 84  --   BILITOT 2.1*  --    ------------------------------------------------------------------------------------------------------------------  Cardiac Enzymes  Recent Labs Lab 05/10/16 1040  TROPONINI <0.03   ------------------------------------------------------------------------------------------------------------------  RADIOLOGY:  No results found.  ASSESSMENT AND PLAN:   Active Problems:   Sepsis ([place])   Protein-calorie malnutrition, severe  * Sepsis due to Pneumonia, HCAP with Hx of cancer and chemo.   Given Zithromax and Rocephin. Follow-up CBC and cultures. DuoNeb and Robitussin when necessary.  As he feels very SOB with monimal exertion, will give vanc + zosyn.  * Acute respiratory failure with hypoxia. Try to wean off oxygen DuoNeb when necessary. * Hypokalemia. Give potassium and follow-up level. * Lung cancer with metastasis, status post chemotherapy. Oncology consult appreciated. * Thrombocytopenia. Possible due to chemotherapy. Follow-up CBC. As per oncology, may d/c from their point, can  follow in office. * Anemia of chronic disease. Stable.   All the records are reviewed and case  discussed with Care Management/Social Workerr. Management plans discussed with the patient, family and they are in agreement.  CODE STATUS: Full.  TOTAL TIME TAKING CARE OF THIS PATIENT: 35 minutes.    POSSIBLE D/C IN 1-2 DAYS, DEPENDING ON CLINICAL CONDITION.   Vaughan Basta M.D on 05/12/2016   Between 7am to 6pm - Pager - [phone]  After 6pm go to [email or web address] - password [email or web address] [facility]  Office  [phone]  CC: Primary care physician; Lloyd Huger, MD  Note: This dictation was prepared with Dragon dictation along with smaller phrase technology. Any transcriptional errors that result from this process are unintentional.

## 2016-05-13 LAB — CBC
HEMATOCRIT: 22.7 % — AB (ref 40.0–52.0)
Hemoglobin: 7.9 g/dL — ABNORMAL LOW (ref 13.0–18.0)
MCH: 34.6 pg — AB (ref 26.0–34.0)
MCHC: 34.8 g/dL (ref 32.0–36.0)
MCV: 99.3 fL (ref 80.0–100.0)
Platelets: 29 10*3/uL — CL (ref 150–440)
RBC: 2.28 MIL/uL — ABNORMAL LOW (ref 4.40–5.90)
RDW: 16.6 % — ABNORMAL HIGH (ref 11.5–14.5)
WBC: 2.4 10*3/uL — ABNORMAL LOW (ref 3.8–10.6)

## 2016-05-13 LAB — BASIC METABOLIC PANEL
Anion gap: 7 (ref 5–15)
BUN: 13 mg/dL (ref 6–20)
CO2: 23 mmol/L (ref 22–32)
CREATININE: 0.58 mg/dL — AB (ref 0.61–1.24)
Calcium: 8.1 mg/dL — ABNORMAL LOW (ref 8.9–10.3)
Chloride: 108 mmol/L (ref 101–111)
GFR calc Af Amer: >60 mL/min (ref >60)
GFR calc non Af Amer: >60 mL/min (ref >60)
GLUCOSE: 110 mg/dL — AB (ref 65–99)
Potassium: 2.9 mmol/L — ABNORMAL LOW (ref 3.5–5.1)
Sodium: 138 mmol/L (ref 135–145)

## 2016-05-13 MED ORDER — ALBUTEROL SULFATE HFA 108 (90 BASE) MCG/ACT IN AERS: 2.0000 | INHALATION_SPRAY | Freq: Four times a day (QID) | RESPIRATORY_TRACT | 2 refills | Status: DC | PRN

## 2016-05-13 MED ORDER — POTASSIUM CHLORIDE CRYS ER 20 MEQ PO TBCR
40.0000 meq | EXTENDED_RELEASE_TABLET | Freq: Two times a day (BID) | ORAL | Status: DC
  Administered 2016-05-13: 09:00:00 40 meq via ORAL
  Filled 2016-05-13: qty 2

## 2016-05-13 MED ORDER — POTASSIUM CHLORIDE ER 10 MEQ PO TBCR
10.0000 meq | EXTENDED_RELEASE_TABLET | Freq: Every day | ORAL | 0 refills | Status: DC
Start: 2016-05-13 — End: 2016-10-07

## 2016-05-13 MED ORDER — CARVEDILOL 3.125 MG PO TABS: 3.1250 mg | ORAL_TABLET | Freq: Two times a day (BID) | ORAL | 0 refills | Status: DC

## 2016-05-13 MED ORDER — SENNOSIDES-DOCUSATE SODIUM 8.6-50 MG PO TABS: 1.0000 | ORAL_TABLET | Freq: Every evening | ORAL | 0 refills | Status: DC | PRN

## 2016-05-13 MED ORDER — HEPARIN SOD (PORK) LOCK FLUSH 100 UNIT/ML IV SOLN
500.0000 [IU] | Freq: Once | INTRAVENOUS | Status: AC
  Administered 2016-05-13: 12:00:00 500 [IU] via INTRAVENOUS
  Filled 2016-05-13: qty 5

## 2016-05-13 MED ORDER — AMOXICILLIN-POT CLAVULANATE 875-125 MG PO TABS: 1.0000 | ORAL_TABLET | Freq: Two times a day (BID) | ORAL | 0 refills | Status: AC

## 2016-05-13 NOTE — Discharge Summary (Signed)
[facility] NAME: [name]    MR#:  [id number]  DATE OF BIRTH:  1941/01/13  DATE OF ADMISSION:  05/10/2016 ADMITTING PHYSICIAN: Demetrios Loll, MD  DATE OF DISCHARGE: 05/13/2016  PRIMARY CARE PHYSICIAN: Lloyd Huger, MD    ADMISSION DIAGNOSIS:  Weakness [R53.1] Tachycardia [R00.0] Dyspnea, unspecified type [R06.00]  DISCHARGE DIAGNOSIS:  Active Problems:   Sepsis ([place])   Protein-calorie malnutrition, severe   Pneumonia- HCAP  SECONDARY DIAGNOSIS:   Past Medical History:  Diagnosis Date  . Brain cancer ([place]) 01/2014   Chemotherapy and radiation treatment  . Colon cancer ([place]) 01/2014  . Kidney stones 1975  . Liver cancer ([place])   . Lung cancer [facility]) 2014   Radiation therapy  . Prostate cancer [facility]) 2005   treated by Dr Levy Pupa COURSE:   * Sepsis due to Pneumonia, HCAP with Hx of cancer and chemo.   Given Zithromax and Rocephin. Follow-up CBC and cultures. DuoNeb and Robitussin when necessary.   As he feels very SOB with minimal exertion,  given vanc + zosyn.   He felt significantly better next day, so d/c on oral augmentin.  * Acute respiratory failure with hypoxia. Try to wean off oxygen DuoNeb when necessary. On room air now.    Will give albuterol resque inhaler on d/c. * Hypokalemia. Give potassium and follow-up level. Give oral tabs daily on d/c and follow with cancer center the level in 1 week. * Lung cancer with metastasis, status post chemotherapy. Oncology consult appreciated. * Thrombocytopenia. Possible due to chemotherapy. Follow-up CBC. As per oncology, may d/c from their point, can follow in office. * Anemia of chronic disease. Stable. Follows at cancer center, and had chemo last week.  DISCHARGE CONDITIONS:   Stable.  CONSULTS OBTAINED:  Treatment Team:  Lloyd Huger, MD  DRUG ALLERGIES:  No Known Allergies  DISCHARGE MEDICATIONS:   Current Discharge Medication  List    START taking these medications   Details  albuterol (PROVENTIL HFA;VENTOLIN HFA) 108 (90 Base) MCG/ACT inhaler Inhale 2 puffs into the lungs every 6 (six) hours as needed for wheezing or shortness of breath. Qty: 1 Inhaler, Refills: 2    amoxicillin-clavulanate (AUGMENTIN) 875-125 MG tablet Take 1 tablet by mouth 2 (two) times daily. For 6 days Qty: 12 tablet, Refills: 0    carvedilol (COREG) 3.125 MG tablet Take 1 tablet (3.125 mg total) by mouth 2 (two) times daily with a meal. Qty: 60 tablet, Refills: 0    potassium chloride (K-DUR) 10 MEQ tablet Take 1 tablet (10 mEq total) by mouth daily. Qty: 10 tablet, Refills: 0    senna-docusate (SENOKOT-S) 8.6-50 MG tablet Take 1 tablet by mouth at bedtime as needed for mild constipation. Qty: 20 tablet, Refills: 0      CONTINUE these medications which have NOT CHANGED   Details  HYDROcodone-acetaminophen (NORCO) 5-325 MG tablet Take 1 tablet by mouth every 6 (six) hours as needed for severe pain. Qty: 30 tablet, Refills: 0   Associated Diagnoses: Carcinoma of sigmoid colon ([place]); Metastasis to liver [facility]); Goals of care, counseling/discussion    lidocaine (XYLOCAINE) 2 % solution Use as directed 10 mLs in the mouth or throat every 4 (four) hours as needed for mouth pain. Qty: 100 mL, Refills: 0         DISCHARGE INSTRUCTIONS:    Follow with cancer center next week.  If you experience worsening of your admission  symptoms, develop shortness of breath, life threatening emergency, suicidal or homicidal thoughts you must seek medical attention immediately by calling 911 or calling your MD immediately  if symptoms less severe.  You Must read complete instructions/literature along with all the possible adverse reactions/side effects for all the Medicines you take and that have been prescribed to you. Take any new Medicines after you have completely understood and accept all the possible adverse reactions/side effects.   Please  note  You were cared for by a hospitalist during your hospital stay. If you have any questions about your discharge medications or the care you received while you were in the hospital after you are discharged, you can call the unit and asked to speak with the hospitalist on call if the hospitalist that took care of you is not available. Once you are discharged, your primary care physician will handle any further medical issues. Please note that NO REFILLS for any discharge medications will be authorized once you are discharged, as it is imperative that you return to your primary care physician (or establish a relationship with a primary care physician if you do not have one) for your aftercare needs so that they can reassess your need for medications and monitor your lab values.    Today   CHIEF COMPLAINT:   Chief Complaint  Patient presents with  . Shortness of Breath    HISTORY OF PRESENT ILLNESS:  [name]  is a 76 y.o. male with a known history of Lung cancer with metastasis, status post chemotherapy last week. He has had the cough and congestion with shortness of breath for the past the couple days. He also complains of fever and chills and fatigue. He was found tachycardia and tachypnea. CT angiogram of the chest did not show any PE but New clustered tree-in-bud ground-glass nodular densities in the posterior right mid and lower lung, likely infectious/ inflammatory.  VITAL SIGNS:  Blood pressure 116/72, pulse (!) 101, temperature 97.4 F (36.3 C), temperature source Oral, resp. rate 18, height '6\' 3"'$  (1.905 m), weight 62.3 kg (137 lb 6.4 oz), SpO2 97 %.  I/O:   Intake/Output Summary (Last 24 hours) at 05/13/16 0844 Last data filed at 05/13/16 0320  Gross per 24 hour  Intake          2406.25 ml  Output              510 ml  Net          1896.25 ml    PHYSICAL EXAMINATION:   GENERAL:  76 y.o.-year-old patient lying in the bed with no acute distress.  EYES: Pupils equal,  round, reactive to light and accommodation. No scleral icterus. Extraocular muscles intact.  HEENT: Head atraumatic, normocephalic. Oropharynx and nasopharynx clear.  NECK:  Supple, no jugular venous distention. No thyroid enlargement, no tenderness.  LUNGS: Normal breath sounds bilaterally, no wheezing, some crepitation. Positive for use of accessory muscles of respiration after trying to sit up in bed. CARDIOVASCULAR: S1, S2 normal. No murmurs, rubs, or gallops.  ABDOMEN: Soft, nontender, nondistended. Bowel sounds present. No organomegaly or mass.  EXTREMITIES: No pedal edema, cyanosis, or clubbing.  NEUROLOGIC: Cranial nerves II through XII are intact. Muscle strength 5/5 in all extremities. Sensation intact. Gait not checked.  PSYCHIATRIC: The patient is alert and oriented x 3.  SKIN: No obvious rash, lesion, or ulcer.   DATA REVIEW:   CBC  Recent Labs Lab 05/13/16 0430  WBC 2.4*  HGB 7.9*  HCT 22.7*  PLT 29*    Chemistries   Recent Labs Lab 05/10/16 1040  05/13/16 0430  NA 135  < > 138  K 3.4*  < > 2.9*  CL 105  < > 108  CO2 23  < > 23  GLUCOSE 104*  < > 110*  BUN 12  < > 13  CREATININE 0.79  < > 0.58*  CALCIUM 8.9  < > 8.1*  AST 21  --   --   ALT 15*  --   --   ALKPHOS 84  --   --   BILITOT 2.1*  --   --   < > = values in this interval not displayed.  Cardiac Enzymes  Recent Labs Lab 05/10/16 1040  TROPONINI <0.03    Microbiology Results  Results for orders placed or performed during the hospital encounter of 05/10/16  Culture, blood (x 2)     Status: None (Preliminary result)   Collection Time: 05/10/16  4:34 PM  Result Value Ref Range Status   Specimen Description BLOOD RIGHT ANTECUBITAL  Final   Special Requests   Final    BOTTLES DRAWN AEROBIC AND ANAEROBIC Blood Culture adequate volume   Culture NO GROWTH 3 DAYS  Final   Report Status PENDING  Incomplete  Culture, blood (x 2)     Status: None (Preliminary result)   Collection Time: 05/10/16   4:34 PM  Result Value Ref Range Status   Specimen Description BLOOD LEFT HAND  Final   Special Requests   Final    BOTTLES DRAWN AEROBIC AND ANAEROBIC Blood Culture adequate volume   Culture NO GROWTH 3 DAYS  Final   Report Status PENDING  Incomplete  Culture, expectorated sputum-assessment     Status: None   Collection Time: 05/12/16  1:07 PM  Result Value Ref Range Status   Specimen Description EXPECTORATED SPUTUM  Final   Special Requests Normal  Final   Sputum evaluation THIS SPECIMEN IS ACCEPTABLE FOR SPUTUM CULTURE  Final   Report Status 05/12/2016 FINAL  Final  Culture, respiratory (NON-Expectorated)     Status: None (Preliminary result)   Collection Time: 05/12/16  1:07 PM  Result Value Ref Range Status   Specimen Description EXPECTORATED SPUTUM  Final   Special Requests Normal Reflexed from [id number]  Final   Gram Stain   Final    RARE WBC PRESENT, PREDOMINANTLY MONONUCLEAR RARE GRAM POSITIVE COCCI IN CHAINS Performed at [facility], [place] [address] [place], [place] [address]    Culture PENDING  Incomplete   Report Status PENDING  Incomplete  MRSA PCR Screening     Status: None   Collection Time: 05/12/16  2:38 PM  Result Value Ref Range Status   MRSA by PCR NEGATIVE NEGATIVE Final    Comment:        The GeneXpert MRSA Assay (FDA approved for NASAL specimens only), is one component of a comprehensive MRSA colonization surveillance program. It is not intended to diagnose MRSA infection nor to guide or monitor treatment for MRSA infections.     RADIOLOGY:  No results found.  EKG:   Orders placed or performed during the hospital encounter of 05/10/16  . ED EKG  . ED EKG      Management plans discussed with the patient, family and they are in agreement.  CODE STATUS:     Code Status Orders        Start     Ordered   05/10/16  1555  Full code  Continuous     05/10/16 1554    Code Status History    Date Active Date Inactive Code Status Order  ID Comments User Context   11/24/2015  3:32 AM 11/25/2015  5:46 PM Full Code [id number]  Harrie Foreman, MD Inpatient      TOTAL TIME TAKING CARE OF THIS PATIENT: 35 minutes.    Vaughan Basta M.D on 05/13/2016 at 8:44 AM  Between 7am to 6pm - Pager - [phone]  After 6pm go to [email or web address] - password [email or web address] [facility]  Office  [phone]  CC: Primary care physician; Lloyd Huger, MD   Note: This dictation was prepared with Dragon dictation along with smaller phrase technology. Any transcriptional errors that result from this process are unintentional.

## 2016-05-13 NOTE — Plan of Care (Signed)
Problem: Nutrition: Goal: Adequate nutrition will be maintained Outcome: Not Progressing Recent chemo. Pt still with poor po intake. Only nibbles. Mouth discomfort. Pt has orders for Peridex, Medline and Lidocaine.

## 2016-05-13 NOTE — Care Management Important Message (Signed)
Important Message  Patient Details  Name: [name] MRN: [id number] Date of Birth: 13-May-1940   Medicare Important Message Given:  Yes  Initial signed IM printed from Epic and given to patient.     Katrina Stack, RN 05/13/2016, 8:43 AM

## 2016-05-13 NOTE — Progress Notes (Signed)
Patient discharged home per MD order. Prescriptions given to patient. All discharge instructions given to patient and wife and all questions answered.

## 2016-05-13 NOTE — Care Management (Signed)
No discharge needs identified by members of the care team 

## 2016-05-13 NOTE — Discharge Instructions (Signed)
Need to follow potassium level at cancer center next week.

## 2016-05-15 LAB — CULTURE, RESPIRATORY: SPECIAL REQUESTS: NORMAL

## 2016-05-15 LAB — CULTURE, BLOOD (ROUTINE X 2)
CULTURE: NO GROWTH
Culture: NO GROWTH
Special Requests: ADEQUATE
Special Requests: ADEQUATE

## 2016-05-15 LAB — CULTURE, RESPIRATORY W GRAM STAIN: Culture: NORMAL

## 2016-05-18 NOTE — Progress Notes (Signed)
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  INTERVAL HISTORY: Patient returns to clinic today for further evaluation and consideration of cycle 12 of 12 of FOLFIRI plus Avastin. He was recently admitted to the hospital for pneumonia. He currently feels significantly improved, but not quite back to his baseline. He continues to tolerate his treatments well without significant side effects. He has mild weakness and fatigue. He has no neurologic complaints. He denies any recent fevers. He has a good appetite. He reports mild dyspnea on exertion, shortness of breath, and cough. He denies any chest pain. He denies any nausea or vomiting. He has no urinary complaints. Patient offers no further specific complaints today.   REVIEW OF SYSTEMS:   Review of Systems  Constitutional: Positive for malaise/fatigue. Negative for fever and weight loss.  Respiratory: Positive for cough and shortness of breath. Negative for hemoptysis.   Cardiovascular: Negative.  Negative for chest pain and leg swelling.  Gastrointestinal: Negative for abdominal pain, blood in stool, constipation, diarrhea and melena.  Musculoskeletal: Negative.   Neurological: Positive for weakness. Negative for sensory change.  Psychiatric/Behavioral: Negative.  The patient is not nervous/anxious.     As per HPI. Otherwise, a complete review of systems is negative.  PAST MEDICAL HISTORY: Past Medical History:  Diagnosis Date  . Brain cancer ([place]) 01/2014   Chemotherapy and radiation treatment  . Colon cancer ([place]) 01/2014  . Kidney stones 1975  . Liver cancer ([place])   . Lung cancer [facility]) 2014   Radiation therapy  . Prostate cancer [facility]) 2005   treated by Dr Eliberto Ivory    PAST SURGICAL HISTORY: Past Surgical History:  Procedure Laterality Date  . COLON SURGERY  05-03-14   Resection of Sigmoid Colon and liver biopsy  . kidney stones  1975  . PORTACATH PLACEMENT  2014  . PROSTATE SURGERY  2005   partial removal due to cancer    FAMILY HISTORY: Family History  Problem Relation Age of Onset  . Lung cancer Father   . Alzheimer's disease Mother     ADVANCED DIRECTIVES (Y/N):  N  HEALTH MAINTENANCE: Social History  Substance Use Topics  . Smoking status: Former Smoker    Years: 35.00  . Smokeless tobacco: Never Used  . Alcohol use No     Colonoscopy:  PAP:  Bone density:  Lipid panel:  No Known Allergies  Current Outpatient Prescriptions  Medication Sig Dispense Refill  . albuterol (PROVENTIL HFA;VENTOLIN HFA) 108 (90 Base) MCG/ACT inhaler Inhale 2 puffs into the lungs every 6 (six) hours as needed for wheezing or shortness of breath. 1 Inhaler 2  . carvedilol (COREG) 3.125 MG tablet Take 1 tablet (3.125 mg total) by mouth 2 (two) times daily with a meal. 60 tablet 0  . HYDROcodone-acetaminophen (NORCO) 5-325 MG tablet Take 1 tablet by mouth every 6 (six) hours as needed for severe pain. 30 tablet 0  . potassium chloride (K-DUR) 10 MEQ tablet Take 1 tablet (10 mEq total) by mouth daily. 10 tablet 0  . senna-docusate (SENOKOT-S) 8.6-50 MG tablet Take 1 tablet by mouth at bedtime as needed for mild constipation. 20 tablet 0   No current facility-administered medications for this visit.  Facility-Administered Medications Ordered in Other Visits  Medication Dose Route Frequency Provider Last Rate Last Dose  . sodium chloride 0.9 % injection 10 mL  10 mL Intracatheter PRN [name], MD   10 mL at 07/10/14 1428    OBJECTIVE: Vitals:   05/19/16 0850  BP: 132/81  Pulse: 86  Resp: 18  Temp: (!) 96.2 F (35.7 C)     Body mass index is 18.21 kg/m.    ECOG FS:1 -  Symptomatic but completely ambulatory  General: Well-developed, well-nourished, no acute distress. Eyes: Pink conjunctiva, anicteric sclera. Lungs: Scattered wheezing throughout. Heart: Regular rate and rhythm. No rubs, murmurs, or gallops. Abdomen: Soft, nontender, nondistended. No organomegaly noted, normoactive bowel sounds. Musculoskeletal: No edema, cyanosis, or clubbing. Neuro: Alert, answering all questions appropriately. Cranial nerves grossly intact. Skin: No rashes or petechiae noted. Psych: Normal affect.   LAB RESULTS:  Lab Results  Component Value Date   NA 138 05/19/2016   K 3.6 05/19/2016   CL 105 05/19/2016   CO2 27 05/19/2016   GLUCOSE 93 05/19/2016   BUN 7 05/19/2016   CREATININE 0.64 05/19/2016   CALCIUM 8.9 05/19/2016   PROT 6.8 05/19/2016   ALBUMIN 3.1 (L) 05/19/2016   AST 17 05/19/2016   ALT 13 (L) 05/19/2016   ALKPHOS 83 05/19/2016   BILITOT 0.4 05/19/2016   GFRNONAA >60 05/19/2016   GFRAA >60 05/19/2016    Lab Results  Component Value Date   WBC 8.3 05/19/2016   NEUTROABS 5.8 05/19/2016   HGB 8.7 (L) 05/19/2016   HCT 25.7 (L) 05/19/2016   MCV 98.3 05/19/2016   PLT 155 05/19/2016   Lab Results  Component Value Date   CEA 67.0 (H) 05/19/2016     STUDIES: Dg Chest 1 View  Result Date: 05/10/2016 CLINICAL DATA:  Worsening shortness of breath and cough for the past 2-3 days. History of multiple cancers. EXAM: CHEST 1 VIEW COMPARISON:  Chest CT 03/22/2016 FINDINGS: The power port is in stable position. The cardiac silhouette, mediastinal and hilar contours are unchanged. Stable dense apical scarring changes on the right side. No new/ acute pulmonary findings. No pleural effusion. The bony thorax is intact. IMPRESSION: Chronic dense right apical scarring changes and pleural thickening but no acute overlying superimposed pulmonary process. Electronically Signed   By: Marijo Sanes M.D.   On: 05/10/2016 11:01   Ct Angio Chest Pe W And/or Wo  Contrast  Result Date: 05/10/2016 CLINICAL DATA:  Shortness of breath, hypoxia. EXAM: CT ANGIOGRAPHY CHEST WITH CONTRAST TECHNIQUE: Multidetector CT imaging of the chest was performed using the standard protocol during bolus administration of intravenous contrast. Multiplanar CT image reconstructions and MIPs were obtained to evaluate the vascular anatomy. CONTRAST:  100 cc Isovue 370 IV COMPARISON:  03/22/2016 FINDINGS: Cardiovascular: Heart is normal size. Aorta is tortuous, non aneurysmal. No filling defects in the pulmonary arteries to suggest pulmonary emboli. Mediastinum/Nodes: No mediastinal, hilar, or axillary adenopathy. Lungs/Pleura: Again noted is heterogeneous collapse and consolidation in the posterior upper right hemithorax with associated bronchiectasis. This is stable since prior study. Volume loss on the right with shift of mediastinal contours to the right, stable. Tree-in-bud clustered nodular densities are noted in the posterior right lower lung, new since prior study. This likely represents small airways disease/bronchiolitis/alveolitis. 11 mm nodule in the posterior left upper lobe is stable. Subpleural nodularity posteriorly in the left lower lobe is stable at 6 mm. No pleural effusions. Upper Abdomen: Numerous calcified lesions throughout the visualized liver are  similar to prior study. Scattered low-density areas within the liver also stable. Musculoskeletal: Chest wall soft tissues are unremarkable. No acute bony abnormality. Review of the MIP images confirms the above findings. IMPRESSION: No evidence of pulmonary embolus. Stable post treatment changes in the right hemithorax. New clustered tree-in-bud ground-glass nodular densities in the posterior right mid and lower lung, likely infectious/ inflammatory. Nodules in the left lung are stable, the largest 11 mm in the posterior left upper lobe. Stable hepatic metastases. Electronically Signed   By: Rolm Baptise M.D.   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 He received carboplatinum and etoposide along with XRT completing in approximately January 2015. Patient had a recurrence in his liver in September 2015 and was reinitiated on treatment with carboplatinum and etoposide again. He was then diagnosed with adenocarcinoma of the sigmoid colon with biopsy-proven metastasis to the liver in March 2016. K-ras wild-type. Patient initiated FOLFOX and Vectibix in April 2016. Patient was found to have a complete response, therefore was switched to Vectibix maintenance therapy in February 2017. Patient was then found to have progressive disease on a PET scan in May 2017 and was subsequently switched to [facility]. PET scan on December 08, 2015 revealed progressive disease and patient started FOLFIRI on December 15, 2015.   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  PLAN:  1. 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 CT scan results reviewed independently with essentially stable disease in patient's liver. CEA appears to have plateaued. Delay cycle 12 of FOLFIRI + Avastin today secondary to recent hospitalization for pneumonia. Return to clinic in 1 week for reconsideration of cycle 12. Patient will then return 6 weeks after his infusion for repeat imaging using PET scan and follow-up 1-2 days later. 2. Anemia: Hemoglobin slightly decreased, monitor. Patient does not require blood transfusion at this time.  3. Thrombocytopenia: Resolved. 4. History of stage IV small cell lung cancer: No obvious evidence of recurrence. Will continue to monitor with routine imaging. 5. Neutropenia: Currently within normal limits. Neulasta with the remainder of cycles. 6. Mouth pain: Continue hydrocodone as needed. 7. Diarrhea: Continue OTC  lomotil as needed. 8. Pneumonia: Improved. Patient has been instructed to complete his oral antibiotics as prescribed.  Patient expressed understanding and was in agreement with this plan. He also understands that He can call clinic at any time with any questions, concerns, or complaints.   Cancer Staging Carcinoma of sigmoid colon [facility]) Staging form: Colon and Rectum, AJCC 7th Edition - Clinical stage from 02/27/2016: Stage IVB (yT3, N1a, M1b) - Signed by Lloyd Huger, MD on 02/27/2016   Lloyd Huger, MD   05/23/2016 8:46 AM

## 2016-05-19 ENCOUNTER — Inpatient Hospital Stay (HOSPITAL_BASED_OUTPATIENT_CLINIC_OR_DEPARTMENT_OTHER): Payer: Medicare Other | Admitting: Oncology

## 2016-05-19 ENCOUNTER — Inpatient Hospital Stay: Payer: Medicare Other | Attending: Oncology

## 2016-05-19 ENCOUNTER — Inpatient Hospital Stay: Payer: Medicare Other

## 2016-05-19 VITALS — BP 132/81 | HR 86 | Temp 96.2°F | Resp 18 | Wt 145.7 lb

## 2016-05-19 DIAGNOSIS — Z8546 Personal history of malignant neoplasm of prostate: Secondary | ICD-10-CM | POA: Diagnosis not present

## 2016-05-19 DIAGNOSIS — Z923 Personal history of irradiation: Secondary | ICD-10-CM

## 2016-05-19 DIAGNOSIS — Z7689 Persons encountering health services in other specified circumstances: Secondary | ICD-10-CM | POA: Insufficient documentation

## 2016-05-19 DIAGNOSIS — K1379 Other lesions of oral mucosa: Secondary | ICD-10-CM | POA: Insufficient documentation

## 2016-05-19 DIAGNOSIS — D649 Anemia, unspecified: Secondary | ICD-10-CM | POA: Insufficient documentation

## 2016-05-19 DIAGNOSIS — Z79899 Other long term (current) drug therapy: Secondary | ICD-10-CM | POA: Insufficient documentation

## 2016-05-19 DIAGNOSIS — R5381 Other malaise: Secondary | ICD-10-CM | POA: Insufficient documentation

## 2016-05-19 DIAGNOSIS — Z87891 Personal history of nicotine dependence: Secondary | ICD-10-CM | POA: Diagnosis not present

## 2016-05-19 DIAGNOSIS — Z5112 Encounter for antineoplastic immunotherapy: Secondary | ICD-10-CM | POA: Diagnosis not present

## 2016-05-19 DIAGNOSIS — J188 Other pneumonia, unspecified organism: Secondary | ICD-10-CM | POA: Diagnosis not present

## 2016-05-19 DIAGNOSIS — Z85841 Personal history of malignant neoplasm of brain: Secondary | ICD-10-CM | POA: Insufficient documentation

## 2016-05-19 DIAGNOSIS — R197 Diarrhea, unspecified: Secondary | ICD-10-CM | POA: Diagnosis not present

## 2016-05-19 DIAGNOSIS — R531 Weakness: Secondary | ICD-10-CM

## 2016-05-19 DIAGNOSIS — Z9221 Personal history of antineoplastic chemotherapy: Secondary | ICD-10-CM | POA: Insufficient documentation

## 2016-05-19 DIAGNOSIS — R5383 Other fatigue: Secondary | ICD-10-CM | POA: Diagnosis not present

## 2016-05-19 DIAGNOSIS — C187 Malignant neoplasm of sigmoid colon: Secondary | ICD-10-CM

## 2016-05-19 DIAGNOSIS — C787 Secondary malignant neoplasm of liver and intrahepatic bile duct: Secondary | ICD-10-CM | POA: Insufficient documentation

## 2016-05-19 DIAGNOSIS — Z87442 Personal history of urinary calculi: Secondary | ICD-10-CM | POA: Insufficient documentation

## 2016-05-19 DIAGNOSIS — Z85118 Personal history of other malignant neoplasm of bronchus and lung: Secondary | ICD-10-CM

## 2016-05-19 LAB — CBC WITH DIFFERENTIAL/PLATELET
BASOS PCT: 0 %
Basophils Absolute: 0 10*3/uL (ref 0–0.1)
EOS ABS: 0.1 10*3/uL (ref 0–0.7)
EOS PCT: 1 %
HCT: 25.7 % — ABNORMAL LOW (ref 40.0–52.0)
Hemoglobin: 8.7 g/dL — ABNORMAL LOW (ref 13.0–18.0)
LYMPHS ABS: 1.6 10*3/uL (ref 1.0–3.6)
Lymphocytes Relative: 19 %
MCH: 33.3 pg (ref 26.0–34.0)
MCHC: 33.9 g/dL (ref 32.0–36.0)
MCV: 98.3 fL (ref 80.0–100.0)
MONOS PCT: 10 %
Monocytes Absolute: 0.8 10*3/uL (ref 0.2–1.0)
Neutro Abs: 5.8 10*3/uL (ref 1.4–6.5)
Neutrophils Relative %: 70 %
PLATELETS: 155 10*3/uL (ref 150–440)
RBC: 2.62 MIL/uL — ABNORMAL LOW (ref 4.40–5.90)
RDW: 17.4 % — ABNORMAL HIGH (ref 11.5–14.5)
WBC: 8.3 10*3/uL (ref 3.8–10.6)

## 2016-05-19 LAB — COMPREHENSIVE METABOLIC PANEL
ALBUMIN: 3.1 g/dL — AB (ref 3.5–5.0)
ALT: 13 U/L — ABNORMAL LOW (ref 17–63)
ANION GAP: 6 (ref 5–15)
AST: 17 U/L (ref 15–41)
Alkaline Phosphatase: 83 U/L (ref 38–126)
BUN: 7 mg/dL (ref 6–20)
CALCIUM: 8.9 mg/dL (ref 8.9–10.3)
CHLORIDE: 105 mmol/L (ref 101–111)
CO2: 27 mmol/L (ref 22–32)
Creatinine, Ser: 0.64 mg/dL (ref 0.61–1.24)
GFR calc non Af Amer: >60 mL/min (ref >60)
Glucose, Bld: 93 mg/dL (ref 65–99)
POTASSIUM: 3.6 mmol/L (ref 3.5–5.1)
Sodium: 138 mmol/L (ref 135–145)
Total Bilirubin: 0.4 mg/dL (ref 0.3–1.2)
Total Protein: 6.8 g/dL (ref 6.5–8.1)

## 2016-05-19 MED ORDER — HEPARIN SOD (PORK) LOCK FLUSH 100 UNIT/ML IV SOLN
500.0000 [IU] | Freq: Once | INTRAVENOUS | Status: AC
  Administered 2016-05-19: 500 [IU] via INTRAVENOUS

## 2016-05-19 NOTE — Progress Notes (Signed)
No treatment today per Dr. Finnegan. LJ 

## 2016-05-19 NOTE — Progress Notes (Signed)
States was recently discharged from the hospital. Pt is recovering from pneumonia and feels fatigued today. Having shortness of breath with exertion.

## 2016-05-20 LAB — CEA: CEA: 67 ng/mL — AB (ref 0.0–4.7)

## 2016-05-26 ENCOUNTER — Inpatient Hospital Stay: Payer: Medicare Other

## 2016-05-26 VITALS — BP 122/84 | HR 91 | Temp 96.5°F | Resp 20

## 2016-05-26 DIAGNOSIS — Z5112 Encounter for antineoplastic immunotherapy: Secondary | ICD-10-CM | POA: Diagnosis not present

## 2016-05-26 DIAGNOSIS — K1379 Other lesions of oral mucosa: Secondary | ICD-10-CM | POA: Diagnosis not present

## 2016-05-26 DIAGNOSIS — C187 Malignant neoplasm of sigmoid colon: Secondary | ICD-10-CM | POA: Diagnosis not present

## 2016-05-26 DIAGNOSIS — C787 Secondary malignant neoplasm of liver and intrahepatic bile duct: Secondary | ICD-10-CM | POA: Diagnosis not present

## 2016-05-26 DIAGNOSIS — Z7689 Persons encountering health services in other specified circumstances: Secondary | ICD-10-CM | POA: Diagnosis not present

## 2016-05-26 DIAGNOSIS — D649 Anemia, unspecified: Secondary | ICD-10-CM | POA: Diagnosis not present

## 2016-05-26 LAB — CBC WITH DIFFERENTIAL/PLATELET
BASOS ABS: 0 10*3/uL (ref 0–0.1)
BASOS PCT: 0 %
EOS ABS: 0.1 10*3/uL (ref 0–0.7)
EOS PCT: 1 %
HCT: 28.2 % — ABNORMAL LOW (ref 40.0–52.0)
HEMOGLOBIN: 9.8 g/dL — AB (ref 13.0–18.0)
LYMPHS ABS: 2.4 10*3/uL (ref 1.0–3.6)
Lymphocytes Relative: 25 %
MCH: 33.9 pg (ref 26.0–34.0)
MCHC: 34.8 g/dL (ref 32.0–36.0)
MCV: 97.4 fL (ref 80.0–100.0)
Monocytes Absolute: 1.3 10*3/uL — ABNORMAL HIGH (ref 0.2–1.0)
Monocytes Relative: 13 %
NEUTROS PCT: 61 %
Neutro Abs: 6.1 10*3/uL (ref 1.4–6.5)
PLATELETS: 224 10*3/uL (ref 150–440)
RBC: 2.89 MIL/uL — AB (ref 4.40–5.90)
RDW: 17 % — ABNORMAL HIGH (ref 11.5–14.5)
WBC: 9.9 10*3/uL (ref 3.8–10.6)

## 2016-05-26 LAB — COMPREHENSIVE METABOLIC PANEL
ALBUMIN: 3.4 g/dL — AB (ref 3.5–5.0)
ALT: 13 U/L — AB (ref 17–63)
AST: 30 U/L (ref 15–41)
Alkaline Phosphatase: 94 U/L (ref 38–126)
Anion gap: 5 (ref 5–15)
BUN: 12 mg/dL (ref 6–20)
CHLORIDE: 102 mmol/L (ref 101–111)
CO2: 27 mmol/L (ref 22–32)
Calcium: 9.1 mg/dL (ref 8.9–10.3)
Creatinine, Ser: 0.72 mg/dL (ref 0.61–1.24)
GFR calc non Af Amer: >60 mL/min (ref >60)
GLUCOSE: 89 mg/dL (ref 65–99)
Potassium: 4.4 mmol/L (ref 3.5–5.1)
SODIUM: 134 mmol/L — AB (ref 135–145)
Total Bilirubin: 0.7 mg/dL (ref 0.3–1.2)
Total Protein: 7.6 g/dL (ref 6.5–8.1)

## 2016-05-26 MED ORDER — IRINOTECAN HCL CHEMO INJECTION 100 MG/5ML
180.0000 mg/m2 | Freq: Once | INTRAVENOUS | Status: AC
  Administered 2016-05-26: 340 mg via INTRAVENOUS
  Filled 2016-05-26: qty 15

## 2016-05-26 MED ORDER — FLUOROURACIL CHEMO INJECTION 5 GM/100ML
2400.0000 mg/m2 | INTRAVENOUS | Status: DC
Start: 2016-05-26 — End: 2016-05-26
  Administered 2016-05-26: 4450 mg via INTRAVENOUS
  Filled 2016-05-26: qty 89

## 2016-05-26 MED ORDER — SODIUM CHLORIDE 0.9% FLUSH
10.0000 mL | Freq: Once | INTRAVENOUS | Status: AC
  Administered 2016-05-26: 10 mL via INTRAVENOUS
  Filled 2016-05-26: qty 10

## 2016-05-26 MED ORDER — PALONOSETRON HCL INJECTION 0.25 MG/5ML
0.2500 mg | Freq: Once | INTRAVENOUS | Status: AC
  Administered 2016-05-26: 0.25 mg via INTRAVENOUS
  Filled 2016-05-26: qty 5

## 2016-05-26 MED ORDER — DEXAMETHASONE SODIUM PHOSPHATE 10 MG/ML IJ SOLN
10.0000 mg | Freq: Once | INTRAMUSCULAR | Status: AC
Start: 2016-05-26 — End: 2016-05-26
  Administered 2016-05-26: 10 mg via INTRAVENOUS

## 2016-05-26 MED ORDER — ATROPINE SULFATE 1 MG/ML IJ SOLN
0.5000 mg | Freq: Once | INTRAMUSCULAR | Status: AC | PRN
  Administered 2016-05-26: 0.5 mg via INTRAVENOUS
  Filled 2016-05-26: qty 1

## 2016-05-26 MED ORDER — FLUOROURACIL CHEMO INJECTION 2.5 GM/50ML
400.0000 mg/m2 | Freq: Once | INTRAVENOUS | Status: AC
  Administered 2016-05-26: 750 mg via INTRAVENOUS
  Filled 2016-05-26: qty 15

## 2016-05-26 MED ORDER — DEXAMETHASONE SODIUM PHOSPHATE 10 MG/ML IJ SOLN
10.0000 mg | Freq: Once | INTRAMUSCULAR | Status: DC
Start: 2016-05-26 — End: 2016-05-26
  Filled 2016-05-26: qty 1

## 2016-05-26 MED ORDER — HEPARIN SOD (PORK) LOCK FLUSH 100 UNIT/ML IV SOLN: 500.0000 [IU] | Freq: Once | INTRAVENOUS | Status: DC

## 2016-05-26 MED ORDER — SODIUM CHLORIDE 0.9 % IV SOLN
Freq: Once | INTRAVENOUS | Status: AC
  Administered 2016-05-26: 10:00:00 via INTRAVENOUS
  Filled 2016-05-26: qty 1000

## 2016-05-26 MED ORDER — SODIUM CHLORIDE 0.9 % IV SOLN
300.0000 mg | Freq: Once | INTRAVENOUS | Status: AC
  Administered 2016-05-26: 300 mg via INTRAVENOUS
  Filled 2016-05-26: qty 12

## 2016-05-26 MED ORDER — SODIUM CHLORIDE 0.9 % IV SOLN: 10.0000 mg | Freq: Once | INTRAVENOUS | Status: DC

## 2016-05-26 MED ORDER — SODIUM CHLORIDE 0.9% FLUSH
10.0000 mL | INTRAVENOUS | Status: DC | PRN
  Filled 2016-05-26: qty 10

## 2016-05-26 MED ORDER — LEUCOVORIN CALCIUM INJECTION 350 MG
750.0000 mg | Freq: Once | INTRAVENOUS | Status: AC
  Administered 2016-05-26: 750 mg via INTRAVENOUS
  Filled 2016-05-26: qty 17.5

## 2016-05-27 LAB — CEA: CEA: 104.6 ng/mL — AB (ref 0.0–4.7)

## 2016-05-28 ENCOUNTER — Inpatient Hospital Stay: Payer: Medicare Other

## 2016-05-28 VITALS — BP 120/71 | HR 96 | Temp 96.0°F | Resp 18

## 2016-05-28 DIAGNOSIS — C187 Malignant neoplasm of sigmoid colon: Secondary | ICD-10-CM

## 2016-05-28 DIAGNOSIS — Z5112 Encounter for antineoplastic immunotherapy: Secondary | ICD-10-CM | POA: Diagnosis not present

## 2016-05-28 DIAGNOSIS — K1379 Other lesions of oral mucosa: Secondary | ICD-10-CM | POA: Diagnosis not present

## 2016-05-28 DIAGNOSIS — Z7689 Persons encountering health services in other specified circumstances: Secondary | ICD-10-CM | POA: Diagnosis not present

## 2016-05-28 DIAGNOSIS — C787 Secondary malignant neoplasm of liver and intrahepatic bile duct: Secondary | ICD-10-CM | POA: Diagnosis not present

## 2016-05-28 DIAGNOSIS — D649 Anemia, unspecified: Secondary | ICD-10-CM | POA: Diagnosis not present

## 2016-05-28 MED ORDER — SODIUM CHLORIDE 0.9% FLUSH
10.0000 mL | INTRAVENOUS | Status: DC | PRN
  Administered 2016-05-28: 10 mL
  Filled 2016-05-28: qty 10

## 2016-05-28 MED ORDER — PEGFILGRASTIM INJECTION 6 MG/0.6ML ~~LOC~~
6.0000 mg | PREFILLED_SYRINGE | Freq: Once | SUBCUTANEOUS | Status: AC
Start: 2016-05-28 — End: 2016-05-28
  Administered 2016-05-28: 6 mg via SUBCUTANEOUS
  Filled 2016-05-28: qty 0.6

## 2016-05-28 MED ORDER — PEGFILGRASTIM INJECTION 6 MG/0.6ML ~~LOC~~
PREFILLED_SYRINGE | SUBCUTANEOUS | Status: AC
  Filled 2016-05-28: qty 0.6

## 2016-05-28 MED ORDER — HEPARIN SOD (PORK) LOCK FLUSH 100 UNIT/ML IV SOLN
500.0000 [IU] | Freq: Once | INTRAVENOUS | Status: AC | PRN
  Administered 2016-05-28: 500 [IU]
  Filled 2016-05-28: qty 5

## 2016-06-17 ENCOUNTER — Inpatient Hospital Stay: Payer: Medicare Other | Attending: Oncology

## 2016-06-17 DIAGNOSIS — C787 Secondary malignant neoplasm of liver and intrahepatic bile duct: Secondary | ICD-10-CM | POA: Insufficient documentation

## 2016-06-17 DIAGNOSIS — Z85841 Personal history of malignant neoplasm of brain: Secondary | ICD-10-CM | POA: Insufficient documentation

## 2016-06-17 DIAGNOSIS — Z801 Family history of malignant neoplasm of trachea, bronchus and lung: Secondary | ICD-10-CM | POA: Insufficient documentation

## 2016-06-17 DIAGNOSIS — R0602 Shortness of breath: Secondary | ICD-10-CM | POA: Insufficient documentation

## 2016-06-17 DIAGNOSIS — Z79899 Other long term (current) drug therapy: Secondary | ICD-10-CM | POA: Insufficient documentation

## 2016-06-17 DIAGNOSIS — Z85118 Personal history of other malignant neoplasm of bronchus and lung: Secondary | ICD-10-CM | POA: Insufficient documentation

## 2016-06-17 DIAGNOSIS — Z87891 Personal history of nicotine dependence: Secondary | ICD-10-CM | POA: Insufficient documentation

## 2016-06-17 DIAGNOSIS — Z8546 Personal history of malignant neoplasm of prostate: Secondary | ICD-10-CM | POA: Insufficient documentation

## 2016-06-17 DIAGNOSIS — D649 Anemia, unspecified: Secondary | ICD-10-CM | POA: Insufficient documentation

## 2016-06-17 DIAGNOSIS — Z87442 Personal history of urinary calculi: Secondary | ICD-10-CM | POA: Insufficient documentation

## 2016-06-17 DIAGNOSIS — Z923 Personal history of irradiation: Secondary | ICD-10-CM | POA: Insufficient documentation

## 2016-06-17 DIAGNOSIS — R05 Cough: Secondary | ICD-10-CM | POA: Insufficient documentation

## 2016-06-17 DIAGNOSIS — C187 Malignant neoplasm of sigmoid colon: Secondary | ICD-10-CM | POA: Insufficient documentation

## 2016-06-17 DIAGNOSIS — Z9221 Personal history of antineoplastic chemotherapy: Secondary | ICD-10-CM | POA: Insufficient documentation

## 2016-06-17 NOTE — Progress Notes (Signed)
Nutrition Follow-up:  Nutrition follow-up completed in clinic this pm with wife.    Patient reports appetite is good.  Wife that appetite was not good shortly after seeing me in March due to hospitalization because of pneumonia.  Wife does confirm that patient's appetite has increased and he is eating well.  Patient reports that he is eating cereal or oatmeal in the mornings 2 bowls of cereal and wife reports that she is having to buy cereal more frequently. Reports that he has been eating more sources of protein (chicken sandwiches, hot dog).  Has been drinking mostly 1 boost plus per day. Wife reports patient has been eating more sweets.    No nutrition related side effects reported.  Medications: reviewed  Labs: reviewed  Anthropometrics:   Weight taken today of 142.1 lb decreased slightly from 143 lb 3.2 oz on 3/22.     NUTRITION DIAGNOSIS: Unintentional weight loss stable   MALNUTRITION DIAGNOSIS: Moderate malnutrition stable   INTERVENTION:   Encouraged patient to continue eating small frequent meals and consuming foods with increased calories and protein. Recommend patient increase boost plus to 2-3 per day for additional calories and protein. Samples and coupons given.    MONITORING, EVALUATION, GOAL: Patient will consume adequate calories and protein to meet nutritional needs and prevent further weight loss.   NEXT VISIT: as needed  Kdyn Vonbehren B. Zenia Resides, [place], [place] Registered Dietitian [phone] (pager)

## 2016-06-25 ENCOUNTER — Ambulatory Visit
Admission: RE | Admit: 2016-06-25 | Discharge: 2016-06-25 | Disposition: A | Discharge status: Home / Self Care | Payer: Medicare Other | Source: Ambulatory Visit | Attending: Oncology | Admitting: Oncology

## 2016-06-25 DIAGNOSIS — M87851 Other osteonecrosis, right femur: Secondary | ICD-10-CM | POA: Diagnosis not present

## 2016-06-25 DIAGNOSIS — C187 Malignant neoplasm of sigmoid colon: Secondary | ICD-10-CM | POA: Diagnosis not present

## 2016-06-25 DIAGNOSIS — N281 Cyst of kidney, acquired: Secondary | ICD-10-CM | POA: Diagnosis not present

## 2016-06-25 DIAGNOSIS — C189 Malignant neoplasm of colon, unspecified: Secondary | ICD-10-CM | POA: Diagnosis not present

## 2016-06-25 DIAGNOSIS — I700 Atherosclerosis of aorta: Secondary | ICD-10-CM | POA: Diagnosis not present

## 2016-06-25 DIAGNOSIS — C787 Secondary malignant neoplasm of liver and intrahepatic bile duct: Secondary | ICD-10-CM | POA: Insufficient documentation

## 2016-06-25 DIAGNOSIS — R918 Other nonspecific abnormal finding of lung field: Secondary | ICD-10-CM | POA: Diagnosis not present

## 2016-06-25 DIAGNOSIS — I2510 Atherosclerotic heart disease of native coronary artery without angina pectoris: Secondary | ICD-10-CM | POA: Insufficient documentation

## 2016-06-25 LAB — GLUCOSE, CAPILLARY: Glucose-Capillary: 72 mg/dL (ref 65–99)

## 2016-06-25 MED ORDER — FLUDEOXYGLUCOSE F - 18 (FDG) INJECTION
12.1200 | Freq: Once | INTRAVENOUS | Status: AC | PRN
  Administered 2016-06-25: 12.12 via INTRAVENOUS

## 2016-06-29 NOTE — Progress Notes (Signed)
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  INTERVAL HISTORY: Patient returns to clinic today for further evaluation and discussion of his imaging results. He currently feels well and is asymptomatic. His appetite has improved and he is gaining weight. He has no neurologic complaints. He denies any recent fevers. He reports mild dyspnea on exertion, shortness of breath, and cough. He denies any chest pain. He denies any nausea or vomiting. He has no urinary complaints. Patient offers no specific complaints today.   REVIEW OF SYSTEMS:   Review of Systems  Constitutional: Negative for fever, malaise/fatigue and weight loss.  Respiratory: Positive for cough and shortness of breath. Negative for hemoptysis.   Cardiovascular: Negative.  Negative for chest pain and leg swelling.  Gastrointestinal: Negative for abdominal pain, blood in stool, constipation, diarrhea and melena.  Genitourinary: Negative.   Musculoskeletal: Negative.   Skin: Negative.  Negative for rash.  Neurological: Negative for sensory change and weakness.  Psychiatric/Behavioral: Negative.  The patient is not nervous/anxious.     As per HPI. Otherwise, a complete review of systems is negative.  PAST MEDICAL HISTORY: Past Medical History:  Diagnosis Date  . Brain cancer ([facility]) 01/2014   Chemotherapy and radiation treatment  . Colon cancer ([place]) 01/2014  . Kidney stones 1975  . Liver cancer ([place])   . Lung cancer [facility]) 2014   Radiation therapy  . Prostate cancer [facility]) 2005   treated by Dr Eliberto Ivory    PAST SURGICAL HISTORY: Past Surgical History:  Procedure Laterality Date  .  COLON SURGERY  05-03-14   Resection of Sigmoid Colon and liver biopsy  . kidney stones  1975  . PORTACATH PLACEMENT  2014  . PROSTATE SURGERY  2005   partial removal due to cancer    FAMILY HISTORY: Family History  Problem Relation Age of Onset  . Lung cancer Father   . Alzheimer's disease Mother     ADVANCED DIRECTIVES (Y/N):  N  HEALTH MAINTENANCE: Social History  Substance Use Topics  . Smoking status: Former Smoker    Years: 35.00  . Smokeless tobacco: Never Used  . Alcohol use No     Colonoscopy:  PAP:  Bone density:  Lipid panel:  No Known Allergies  Current Outpatient Prescriptions  Medication Sig Dispense Refill  . albuterol (PROVENTIL HFA;VENTOLIN HFA) 108 (90 Base) MCG/ACT inhaler Inhale 2 puffs into the lungs every 6 (six) hours as needed for wheezing or shortness of breath. 1 Inhaler 2  . carvedilol (COREG) 3.125 MG tablet Take 1 tablet (3.125 mg total) by mouth 2 (two) times daily with a meal. 60 tablet 0  . HYDROcodone-acetaminophen (NORCO) 5-325 MG tablet Take 1 tablet by mouth every 6 (six) hours as needed for severe pain. 30 tablet 0  . potassium chloride (K-DUR) 10 MEQ tablet Take 1 tablet (10 mEq total) by mouth daily. 10 tablet 0  . senna-docusate (SENOKOT-S) 8.6-50 MG tablet Take 1 tablet by mouth at bedtime as needed for mild constipation. 20 tablet 0   No current facility-administered medications for this visit.    Facility-Administered Medications Ordered in Other Visits  Medication Dose Route Frequency Provider Last Rate Last Dose  . sodium chloride 0.9 %  injection 10 mL  10 mL Intracatheter PRN [name], MD   10 mL at 07/10/14 1428    OBJECTIVE: Vitals:   06/30/16 1210  BP: 122/86  Pulse: (!) 105  Resp: 20  Temp: 97 F (36.1 C)     Body mass index is 18.42 kg/m.    ECOG FS:1 - Symptomatic but completely ambulatory  General: Well-developed, well-nourished, no acute distress. Eyes: Pink conjunctiva, anicteric sclera. Lungs:  Scattered wheezing throughout. Heart: Regular rate and rhythm. No rubs, murmurs, or gallops. Abdomen: Soft, nontender, nondistended. No organomegaly noted, normoactive bowel sounds. Musculoskeletal: No edema, cyanosis, or clubbing. Neuro: Alert, answering all questions appropriately. Cranial nerves grossly intact. Skin: No rashes or petechiae noted. Psych: Normal affect.   LAB RESULTS:  Lab Results  Component Value Date   NA 134 (L) 05/26/2016   K 4.4 05/26/2016   CL 102 05/26/2016   CO2 27 05/26/2016   GLUCOSE 89 05/26/2016   BUN 12 05/26/2016   CREATININE 0.72 05/26/2016   CALCIUM 9.1 05/26/2016   PROT 7.6 05/26/2016   ALBUMIN 3.4 (L) 05/26/2016   AST 30 05/26/2016   ALT 13 (L) 05/26/2016   ALKPHOS 94 05/26/2016   BILITOT 0.7 05/26/2016   GFRNONAA >60 05/26/2016   GFRAA >60 05/26/2016    Lab Results  Component Value Date   WBC 9.9 05/26/2016   NEUTROABS 6.1 05/26/2016   HGB 9.8 (L) 05/26/2016   HCT 28.2 (L) 05/26/2016   MCV 97.4 05/26/2016   PLT 224 05/26/2016   Lab Results  Component Value Date   CEA 316.5 (H) 06/30/2016     STUDIES: Nm Pet Image Restag (ps) Skull Base To Thigh  Result Date: 06/25/2016 CLINICAL DATA:  Subsequent treatment strategy for metastatic sigmoid colon cancer to the liver. EXAM: NUCLEAR MEDICINE PET SKULL BASE TO THIGH TECHNIQUE: 12.1 mCi F-18 FDG was injected intravenously. Full-ring PET imaging was performed from the skull base to thigh after the radiotracer. CT data was obtained and used for attenuation correction and anatomic localization. FASTING BLOOD GLUCOSE:  Value: 72 mg/dl COMPARISON:  Multiple exams, including PET-CT from 12/08/2015 FINDINGS: NECK No hypermetabolic lymph nodes in the neck. Previous activity in the right palatine tonsil and adjacent right level IIa lymph node have resolved completely. Mild chronic right maxillary sinusitis. CHEST Again noted is consolidation in some high density material involving the right upper  lobe and possibly the superior segment right lower lobe with associated volume loss an low-grade metabolic activity unchanged from prior. New compared to the prior PET-CT but relatively similar to the prior CT chest from 04/12/2016, there are reticulonodular opacities in the right lower lobe posteriorly. These have low-grade metabolic activity, maximum SUV 3.6, and are likely due to atypical infectious process. The patient has a known 1.1 by 0.8 cm left upper lobe nodule anterior to the major fissure on image 86/3, this demonstrates no metabolic activity. Right Port-A-Cath tip: Lower SVC. Coronary and aortic atherosclerosis noted. ABDOMEN/PELVIS The scattered liver lesions are smaller and less hypermetabolic compared to previous. These also demonstrate higher density than before. For example, the lesion in the dome of the right hepatic lobe now has a greater degree of central necrosis, with abnormal hypermetabolic activity measuring 5.1 by 4.8 cm (Formerly 8.4 by 7.2 cm) And with a maximum SUV currently of 9.7 (formerly 15.7). The other lesions are similarly reduced in size and metabolic activity. No new liver metastatic lesions are observed. Bilateral photopenic renal cysts. Aortoiliac atherosclerotic vascular disease. Postoperative findings in  the sigmoid colon. Prominent stool throughout the colon favors constipation. SKELETON No focal hypermetabolic activity to suggest skeletal metastasis. Chronic avascular necrosis of the right femoral head. No abnormal change in femoral head contour. IMPRESSION: 1. Significant reduction in size and metabolic activity of the scattered hepatic metastatic lesions. 2. Prior questionable activity in the right neck has completely resolved. 3. Reticulonodular opacities in the right lower lobe favor atypical infectious process and demonstrate low-grade metabolic activity. There is also low-level activity associated with the chronic consolidation in the right upper lobe and superior  segment right lower lobe. The left upper lobe nodule remains without accentuated metabolic activity. 4. Other imaging findings of potential clinical significance: Aortic Atherosclerosis (ICD10-I70.0). Coronary atherosclerosis. Chronic avascular necrosis of the right femoral head. Prominent stool throughout the colon favors constipation. Bilateral photopenic renal cysts. Electronically Signed   By: Van Clines M.D.   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 He received carboplatinum and etoposide along with XRT completing in approximately January 2015. Patient had a recurrence in his liver in September 2015 and was reinitiated on treatment with carboplatinum and etoposide again. He was then diagnosed with adenocarcinoma of the sigmoid colon with biopsy-proven metastasis to the liver in March 2016. K-ras wild-type. Patient initiated FOLFOX and Vectibix in April 2016. Patient was found to have a complete response, therefore was switched to Vectibix maintenance therapy in February 2017. Patient was then found to have progressive disease on a PET scan in May 2017 and was subsequently switched to [facility]. PET scan on December 08, 2015 revealed progressive disease and patient received 12 cycles of FOLFIRI between December 15, 2015 and May 26, 2016.   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  PLAN:  1. 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 Despite significant improvement in patient's PET scan, his CEA continues to increase significantly. Patient will return to clinic in 6 weeks for further evaluation and discussion of initiating maintenance 5-FU and Avastin.  2. Anemia: Hemoglobin improving, monitor. Patient does not require blood transfusion at this time.  3. Thrombocytopenia:  Resolved. 4. History of stage IV small cell lung cancer: No obvious evidence of recurrence. Will continue to monitor with routine imaging. 5. Neutropenia: Currently within normal limits. Patient will require Neulasta if he reinitiate chemotherapy.  6. Mouth pain: Resolved. 7. Diarrhea: Resolved. Continue OTC lomotil as needed.  Patient expressed understanding and was in agreement with this plan. He also understands that He can call clinic at any time with any questions, concerns, or complaints.   Cancer Staging Carcinoma of sigmoid colon [facility]) Staging form: Colon and Rectum, AJCC 7th Edition - Clinical stage from 02/27/2016: Stage IVB (yT3, N1a, M1b) - Signed by Lloyd Huger, MD on 02/27/2016   Lloyd Huger, MD   07/03/2016 7:26 AM

## 2016-06-30 ENCOUNTER — Inpatient Hospital Stay: Payer: Medicare Other

## 2016-06-30 ENCOUNTER — Inpatient Hospital Stay (HOSPITAL_BASED_OUTPATIENT_CLINIC_OR_DEPARTMENT_OTHER): Payer: Medicare Other | Admitting: Oncology

## 2016-06-30 VITALS — BP 122/86 | HR 105 | Temp 97.0°F | Resp 20 | Wt 147.4 lb

## 2016-06-30 DIAGNOSIS — Z923 Personal history of irradiation: Secondary | ICD-10-CM

## 2016-06-30 DIAGNOSIS — Z85118 Personal history of other malignant neoplasm of bronchus and lung: Secondary | ICD-10-CM

## 2016-06-30 DIAGNOSIS — R0602 Shortness of breath: Secondary | ICD-10-CM | POA: Diagnosis not present

## 2016-06-30 DIAGNOSIS — Z87891 Personal history of nicotine dependence: Secondary | ICD-10-CM | POA: Diagnosis not present

## 2016-06-30 DIAGNOSIS — D649 Anemia, unspecified: Secondary | ICD-10-CM

## 2016-06-30 DIAGNOSIS — Z8546 Personal history of malignant neoplasm of prostate: Secondary | ICD-10-CM | POA: Diagnosis not present

## 2016-06-30 DIAGNOSIS — Z87442 Personal history of urinary calculi: Secondary | ICD-10-CM

## 2016-06-30 DIAGNOSIS — Z801 Family history of malignant neoplasm of trachea, bronchus and lung: Secondary | ICD-10-CM | POA: Diagnosis not present

## 2016-06-30 DIAGNOSIS — Z85841 Personal history of malignant neoplasm of brain: Secondary | ICD-10-CM | POA: Diagnosis not present

## 2016-06-30 DIAGNOSIS — Z79899 Other long term (current) drug therapy: Secondary | ICD-10-CM | POA: Diagnosis not present

## 2016-06-30 DIAGNOSIS — C187 Malignant neoplasm of sigmoid colon: Secondary | ICD-10-CM

## 2016-06-30 DIAGNOSIS — R05 Cough: Secondary | ICD-10-CM | POA: Diagnosis not present

## 2016-06-30 DIAGNOSIS — C787 Secondary malignant neoplasm of liver and intrahepatic bile duct: Secondary | ICD-10-CM | POA: Diagnosis not present

## 2016-06-30 DIAGNOSIS — Z9221 Personal history of antineoplastic chemotherapy: Secondary | ICD-10-CM | POA: Diagnosis not present

## 2016-06-30 NOTE — Progress Notes (Signed)
Patient here today for follow up, PET results. Patient denies any concerns.

## 2016-07-01 LAB — CEA: CEA: 316.5 ng/mL — ABNORMAL HIGH (ref 0.0–4.7)

## 2016-07-26 ENCOUNTER — Telehealth: Payer: Self-pay

## 2016-07-26 NOTE — Telephone Encounter (Signed)
Nutrition Follow-up:  Spoke with patient via phone regarding nutrition follow-up.  Patient reports his appetite is doing good.  Patient reports that he is eating about 2 meals per day (breakfast and supper) and drinking 1 boost plus per day.  "I know I need to eat more."  "I am going to try and work on that." No other nutrition related symptoms mentioned at this time.   Medications: reviewed  Labs: reviewed  Anthropometrics:   Noted weight on 5/16 147 lb 6.4 oz increased from weight of 142.1 lb on 5/3.     NUTRITION DIAGNOSIS: Unintentional weight loss improved   MALNUTRITION DIAGNOSIS: Moderate malnutrition improved   INTERVENTION:   Encouraged patient to consume 2-3 boost plus per day for additional calories and protein.   Encouraged patient to have a noon time meal consisting of foods high in calories and protein to continue weight gain.    MONITORING, EVALUATION, GOAL: Patient will consume adequate calories and protein to prevent further weight loss   NEXT VISIT: as needed  Deeya Richeson B. Zenia Resides, [place], [place] Registered Dietitian [phone] (pager)

## 2016-08-11 ENCOUNTER — Other Ambulatory Visit: Payer: Self-pay

## 2016-08-11 ENCOUNTER — Inpatient Hospital Stay: Payer: Medicare Other | Attending: Oncology

## 2016-08-11 DIAGNOSIS — C187 Malignant neoplasm of sigmoid colon: Secondary | ICD-10-CM | POA: Insufficient documentation

## 2016-08-11 LAB — CBC WITH DIFFERENTIAL/PLATELET
Basophils Absolute: 0 10*3/uL (ref 0–0.1)
Basophils Relative: 0 %
EOS PCT: 4 %
Eosinophils Absolute: 0.2 10*3/uL (ref 0–0.7)
HCT: 32.3 % — ABNORMAL LOW (ref 40.0–52.0)
Hemoglobin: 10.9 g/dL — ABNORMAL LOW (ref 13.0–18.0)
LYMPHS ABS: 1.6 10*3/uL (ref 1.0–3.6)
LYMPHS PCT: 23 %
MCH: 30.4 pg (ref 26.0–34.0)
MCHC: 33.8 g/dL (ref 32.0–36.0)
MCV: 90.1 fL (ref 80.0–100.0)
MONO ABS: 0.4 10*3/uL (ref 0.2–1.0)
MONOS PCT: 6 %
Neutro Abs: 4.7 10*3/uL (ref 1.4–6.5)
Neutrophils Relative %: 67 %
PLATELETS: 263 10*3/uL (ref 150–440)
RBC: 3.58 MIL/uL — AB (ref 4.40–5.90)
RDW: 15.7 % — ABNORMAL HIGH (ref 11.5–14.5)
WBC: 7 10*3/uL (ref 3.8–10.6)

## 2016-08-11 LAB — COMPREHENSIVE METABOLIC PANEL
ALBUMIN: 3.5 g/dL (ref 3.5–5.0)
ALT: 26 U/L (ref 17–63)
AST: 49 U/L — AB (ref 15–41)
Alkaline Phosphatase: 290 U/L — ABNORMAL HIGH (ref 38–126)
Anion gap: 8 (ref 5–15)
BUN: 12 mg/dL (ref 6–20)
CHLORIDE: 100 mmol/L — AB (ref 101–111)
CO2: 27 mmol/L (ref 22–32)
Calcium: 9.5 mg/dL (ref 8.9–10.3)
Creatinine, Ser: 0.79 mg/dL (ref 0.61–1.24)
GFR calc Af Amer: >60 mL/min (ref >60)
GFR calc non Af Amer: >60 mL/min (ref >60)
GLUCOSE: 139 mg/dL — AB (ref 65–99)
POTASSIUM: 3.7 mmol/L (ref 3.5–5.1)
Sodium: 135 mmol/L (ref 135–145)
Total Bilirubin: 0.5 mg/dL (ref 0.3–1.2)
Total Protein: 7.8 g/dL (ref 6.5–8.1)

## 2016-08-12 LAB — CEA: CEA: 715.6 ng/mL — ABNORMAL HIGH (ref 0.0–4.7)

## 2016-08-16 NOTE — Progress Notes (Signed)
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  INTERVAL HISTORY: Patient returns to clinic today for further evaluation, laboratory work, and treatment planning. He continues to feel well and is asymptomatic. His appetite has improved and he is gaining weight. He has no neurologic complaints. He denies any recent fevers. He reports mild dyspnea on exertion. He denies any chest pain, shortness of breath, or cough. He denies any nausea or vomiting. He has no urinary complaints. Patient offers no specific complaints today.   REVIEW OF SYSTEMS:   Review of Systems  Constitutional: Negative for fever, malaise/fatigue and weight loss.  Respiratory: Positive for shortness of breath. Negative for cough and hemoptysis.   Cardiovascular: Negative.  Negative for chest pain and leg swelling.  Gastrointestinal: Negative for abdominal pain, blood in stool, constipation, diarrhea and melena.  Genitourinary: Negative.   Musculoskeletal: Negative.   Skin: Negative.  Negative for rash.  Neurological: Negative for sensory change and weakness.  Psychiatric/Behavioral: Negative.  The patient is not nervous/anxious.     As per HPI. Otherwise, a complete review of systems is negative.  PAST MEDICAL HISTORY: Past Medical History:  Diagnosis Date  . Brain cancer ([place]) 01/2014   Chemotherapy and radiation treatment  . Colon cancer ([place]) 01/2014  . Kidney stones 1975  . Liver cancer ([place])   . Lung cancer [facility]) 2014   Radiation therapy  . Prostate cancer [facility]) 2005   treated by Dr Eliberto Ivory    PAST SURGICAL HISTORY: Past Surgical History:  Procedure Laterality Date  .  COLON SURGERY  05-03-14   Resection of Sigmoid Colon and liver biopsy  . kidney stones  1975  . PORTACATH PLACEMENT  2014  . PROSTATE SURGERY  2005   partial removal due to cancer    FAMILY HISTORY: Family History  Problem Relation Age of Onset  . Lung cancer Father   . Alzheimer's disease Mother     ADVANCED DIRECTIVES (Y/N):  N  HEALTH MAINTENANCE: Social History  Substance Use Topics  . Smoking status: Former Smoker    Years: 35.00  . Smokeless tobacco: Never Used  . Alcohol use No     Colonoscopy:  PAP:  Bone density:  Lipid panel:  No Known Allergies  Current Outpatient Prescriptions  Medication Sig Dispense Refill  . albuterol (PROVENTIL HFA;VENTOLIN HFA) 108 (90 Base) MCG/ACT inhaler Inhale 2 puffs into the lungs every 6 (six) hours as needed for wheezing or shortness of breath. 1 Inhaler 2  . carvedilol (COREG) 3.125 MG tablet Take 1 tablet (3.125 mg total) by mouth 2 (two) times daily with a meal. 60 tablet 0  . HYDROcodone-acetaminophen (NORCO) 5-325 MG tablet Take 1 tablet by mouth every 6 (six) hours as needed for severe pain. 30 tablet 0  . potassium chloride (K-DUR) 10 MEQ tablet Take 1 tablet (10 mEq total) by mouth daily. 10 tablet 0  . senna-docusate (SENOKOT-S) 8.6-50 MG tablet Take 1 tablet by mouth at bedtime as needed for mild constipation. 20 tablet 0   No current facility-administered medications for this visit.    Facility-Administered Medications Ordered in Other Visits  Medication Dose Route Frequency Provider Last Rate Last Dose  . sodium chloride 0.9 %  injection 10 mL  10 mL Intracatheter PRN [name], MD   10 mL at 07/10/14 1428    OBJECTIVE: Vitals:   08/17/16 1407  BP: 128/82  Pulse: (!) 108  Resp: 20  Temp: (!) 96.8 F (36 C)     Body mass index is 18.62 kg/m.    ECOG FS:1 - Symptomatic but completely ambulatory  General: Well-developed, well-nourished, no acute distress. Eyes: Pink conjunctiva, anicteric sclera. Lungs:  Scattered wheezing throughout. Heart: Regular rate and rhythm. No rubs, murmurs, or gallops. Abdomen: Soft, nontender, nondistended. No organomegaly noted, normoactive bowel sounds. Musculoskeletal: No edema, cyanosis, or clubbing. Neuro: Alert, answering all questions appropriately. Cranial nerves grossly intact. Skin: No rashes or petechiae noted. Psych: Normal affect.   LAB RESULTS:  Lab Results  Component Value Date   NA 135 08/11/2016   K 3.7 08/11/2016   CL 100 (L) 08/11/2016   CO2 27 08/11/2016   GLUCOSE 139 (H) 08/11/2016   BUN 12 08/11/2016   CREATININE 0.79 08/11/2016   CALCIUM 9.5 08/11/2016   PROT 7.8 08/11/2016   ALBUMIN 3.5 08/11/2016   AST 49 (H) 08/11/2016   ALT 26 08/11/2016   ALKPHOS 290 (H) 08/11/2016   BILITOT 0.5 08/11/2016   GFRNONAA >60 08/11/2016   GFRAA >60 08/11/2016    Lab Results  Component Value Date   WBC 7.0 08/11/2016   NEUTROABS 4.7 08/11/2016   HGB 10.9 (L) 08/11/2016   HCT 32.3 (L) 08/11/2016   MCV 90.1 08/11/2016   PLT 263 08/11/2016   Lab Results  Component Value Date   CEA 715.6 (H) 08/11/2016     STUDIES: No results found. 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 He received carboplatinum and etoposide along with XRT completing in approximately January 2015. Patient had a recurrence in his liver in September 2015 and was reinitiated on treatment with carboplatinum and etoposide again. He was then diagnosed with adenocarcinoma of the sigmoid colon with biopsy-proven metastasis to the liver in March 2016. K-ras wild-type. Patient initiated FOLFOX and Vectibix in April 2016. Patient was found to have a complete response, therefore was switched to Vectibix maintenance therapy in February 2017. Patient was then found to have progressive disease on a PET scan in May 2017 and was subsequently switched to [facility]. PET scan on December 08, 2015 revealed  progressive disease and patient received 12 cycles of FOLFIRI between December 15, 2015 and May 26, 2016.   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  PLAN:  1. 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 Despite significant improvement in patient's PET scan, his CEA continues to increase significantly. After lengthy discussion with the patient, he has agreed to reinitiate treatment in one week with FOLFIRI plus Avastin. We will get restaging CT scan prior to initiating treatment.  2. Anemia: Hemoglobin improving, monitor. Patient does not require blood transfusion at this time.  3. Thrombocytopenia: Resolved. 4. History of stage IV small cell lung cancer: No obvious evidence of recurrence. Will continue to monitor with routine imaging. 5. Neutropenia: Currently within normal limits. Patient will require Neulasta with chemotherapy.  6. Mouth pain: Resolved. 7. Diarrhea: Resolved. Continue OTC lomotil as needed.  Patient expressed understanding and was in agreement with this plan. He also understands that He can call clinic at any time with any questions, concerns, or  complaints.   Cancer Staging Carcinoma of sigmoid colon [facility]) Staging form: Colon and Rectum, AJCC 7th Edition - Clinical stage from 02/27/2016: Stage IVB (yT3, N1a, M1b) - Signed by Lloyd Huger, MD on 02/27/2016   Lloyd Huger, MD   08/17/2016 2:18 PM

## 2016-08-17 ENCOUNTER — Inpatient Hospital Stay: Payer: Medicare Other | Attending: Oncology | Admitting: Oncology

## 2016-08-17 VITALS — BP 128/82 | HR 108 | Temp 96.8°F | Resp 20 | Wt 149.0 lb

## 2016-08-17 DIAGNOSIS — Z801 Family history of malignant neoplasm of trachea, bronchus and lung: Secondary | ICD-10-CM | POA: Diagnosis not present

## 2016-08-17 DIAGNOSIS — D649 Anemia, unspecified: Secondary | ICD-10-CM | POA: Insufficient documentation

## 2016-08-17 DIAGNOSIS — Z87442 Personal history of urinary calculi: Secondary | ICD-10-CM | POA: Insufficient documentation

## 2016-08-17 DIAGNOSIS — Z85841 Personal history of malignant neoplasm of brain: Secondary | ICD-10-CM | POA: Diagnosis not present

## 2016-08-17 DIAGNOSIS — Z79899 Other long term (current) drug therapy: Secondary | ICD-10-CM | POA: Diagnosis not present

## 2016-08-17 DIAGNOSIS — Z7689 Persons encountering health services in other specified circumstances: Secondary | ICD-10-CM | POA: Insufficient documentation

## 2016-08-17 DIAGNOSIS — Z87891 Personal history of nicotine dependence: Secondary | ICD-10-CM | POA: Diagnosis not present

## 2016-08-17 DIAGNOSIS — Z9221 Personal history of antineoplastic chemotherapy: Secondary | ICD-10-CM | POA: Diagnosis not present

## 2016-08-17 DIAGNOSIS — N62 Hypertrophy of breast: Secondary | ICD-10-CM | POA: Insufficient documentation

## 2016-08-17 DIAGNOSIS — Z5112 Encounter for antineoplastic immunotherapy: Secondary | ICD-10-CM | POA: Insufficient documentation

## 2016-08-17 DIAGNOSIS — Z8546 Personal history of malignant neoplasm of prostate: Secondary | ICD-10-CM | POA: Insufficient documentation

## 2016-08-17 DIAGNOSIS — Z923 Personal history of irradiation: Secondary | ICD-10-CM | POA: Diagnosis not present

## 2016-08-17 DIAGNOSIS — R630 Anorexia: Secondary | ICD-10-CM | POA: Diagnosis not present

## 2016-08-17 DIAGNOSIS — I700 Atherosclerosis of aorta: Secondary | ICD-10-CM | POA: Insufficient documentation

## 2016-08-17 DIAGNOSIS — C187 Malignant neoplasm of sigmoid colon: Secondary | ICD-10-CM | POA: Insufficient documentation

## 2016-08-17 DIAGNOSIS — J479 Bronchiectasis, uncomplicated: Secondary | ICD-10-CM | POA: Insufficient documentation

## 2016-08-17 DIAGNOSIS — Z85118 Personal history of other malignant neoplasm of bronchus and lung: Secondary | ICD-10-CM | POA: Diagnosis not present

## 2016-08-17 DIAGNOSIS — R0609 Other forms of dyspnea: Secondary | ICD-10-CM | POA: Insufficient documentation

## 2016-08-17 DIAGNOSIS — C787 Secondary malignant neoplasm of liver and intrahepatic bile duct: Secondary | ICD-10-CM | POA: Diagnosis not present

## 2016-08-17 NOTE — Progress Notes (Signed)
Patient denies any concerns today.  

## 2016-08-20 ENCOUNTER — Ambulatory Visit
Admission: RE | Admit: 2016-08-20 | Discharge: 2016-08-20 | Disposition: A | Discharge status: Home / Self Care | Payer: Medicare Other | Source: Ambulatory Visit | Attending: Oncology | Admitting: Oncology

## 2016-08-20 DIAGNOSIS — N281 Cyst of kidney, acquired: Secondary | ICD-10-CM | POA: Insufficient documentation

## 2016-08-20 DIAGNOSIS — I2510 Atherosclerotic heart disease of native coronary artery without angina pectoris: Secondary | ICD-10-CM | POA: Diagnosis not present

## 2016-08-20 DIAGNOSIS — I700 Atherosclerosis of aorta: Secondary | ICD-10-CM | POA: Insufficient documentation

## 2016-08-20 DIAGNOSIS — C787 Secondary malignant neoplasm of liver and intrahepatic bile duct: Secondary | ICD-10-CM | POA: Diagnosis not present

## 2016-08-20 DIAGNOSIS — C189 Malignant neoplasm of colon, unspecified: Secondary | ICD-10-CM | POA: Diagnosis not present

## 2016-08-20 DIAGNOSIS — Z08 Encounter for follow-up examination after completed treatment for malignant neoplasm: Secondary | ICD-10-CM | POA: Diagnosis not present

## 2016-08-20 DIAGNOSIS — I77810 Thoracic aortic ectasia: Secondary | ICD-10-CM | POA: Insufficient documentation

## 2016-08-20 DIAGNOSIS — J479 Bronchiectasis, uncomplicated: Secondary | ICD-10-CM | POA: Insufficient documentation

## 2016-08-20 DIAGNOSIS — Z85038 Personal history of other malignant neoplasm of large intestine: Secondary | ICD-10-CM | POA: Insufficient documentation

## 2016-08-20 DIAGNOSIS — C187 Malignant neoplasm of sigmoid colon: Secondary | ICD-10-CM

## 2016-08-20 DIAGNOSIS — R911 Solitary pulmonary nodule: Secondary | ICD-10-CM | POA: Diagnosis not present

## 2016-08-20 MED ORDER — IOPAMIDOL (ISOVUE-300) INJECTION 61%
100.0000 mL | Freq: Once | INTRAVENOUS | Status: AC | PRN
  Administered 2016-08-20: 100 mL via INTRAVENOUS

## 2016-08-24 NOTE — Progress Notes (Signed)
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  INTERVAL HISTORY: Patient returns to clinic today for further evaluation, laboratory work, and reinitiation of February was Avastin. He has noted worsening shortness of breath over the past week, but otherwise has felt well. He has a decreased appetite. He has no neurologic complaints. He denies any recent fevers. He denies any chest pain, shortness of breath, or cough. He denies any nausea or vomiting. He has no urinary complaints. Patient offers no  further specific complaints today.   REVIEW OF SYSTEMS:   Review of Systems  Constitutional: Negative for fever, malaise/fatigue and weight loss.  Respiratory: Positive for shortness of breath. Negative for cough and hemoptysis.   Cardiovascular: Negative.  Negative for chest pain and leg swelling.  Gastrointestinal: Negative for abdominal pain, blood in stool, constipation, diarrhea and melena.  Genitourinary: Negative.   Musculoskeletal: Negative.   Skin: Negative.  Negative for rash.  Neurological: Negative for sensory change and weakness.  Psychiatric/Behavioral: Negative.  The patient is not nervous/anxious.     As per HPI. Otherwise, a complete review of systems is negative.  PAST MEDICAL HISTORY: Past Medical History:  Diagnosis Date  . Brain cancer ([place]) 01/2014   Chemotherapy and radiation treatment  . Colon cancer ([place]) 01/2014  . Kidney stones 1975  . Liver cancer ([place])   . Lung cancer [facility]) 2014   Radiation therapy  . Prostate cancer [facility]) 2005   treated by Dr Eliberto Ivory    PAST SURGICAL HISTORY: Past Surgical History:  Procedure  Laterality Date  . COLON SURGERY  05-03-14   Resection of Sigmoid Colon and liver biopsy  . kidney stones  1975  . PORTACATH PLACEMENT  2014  . PROSTATE SURGERY  2005   partial removal due to cancer    FAMILY HISTORY: Family History  Problem Relation Age of Onset  . Lung cancer Father   . Alzheimer's disease Mother     ADVANCED DIRECTIVES (Y/N):  N  HEALTH MAINTENANCE: Social History  Substance Use Topics  . Smoking status: Former Smoker    Years: 35.00  . Smokeless tobacco: Never Used  . Alcohol use No     Colonoscopy:  PAP:  Bone density:  Lipid panel:  No Known Allergies  Current Outpatient Prescriptions  Medication Sig Dispense Refill  . albuterol (PROVENTIL HFA;VENTOLIN HFA) 108 (90 Base) MCG/ACT inhaler Inhale 2 puffs into the lungs every 4 (four) hours as needed for wheezing or shortness of breath. 1 Inhaler 2  . carvedilol (COREG) 3.125 MG tablet Take 1 tablet (3.125 mg total) by mouth 2 (two) times daily with a meal. (Patient not taking: Reported on 08/25/2016) 60 tablet 0  . dexamethasone (DECADRON) 4 MG tablet Take 1 tablet (4 mg total) by mouth daily. 30 tablet 1  . HYDROcodone-acetaminophen (NORCO) 5-325 MG tablet Take 1 tablet by mouth every 6 (six) hours as needed for severe pain. (Patient not taking: Reported on 08/25/2016) 30 tablet 0  . potassium chloride (K-DUR) 10 MEQ tablet Take 1 tablet (10 mEq total) by mouth daily. (Patient not taking: Reported on 08/25/2016) 10 tablet 0  . senna-docusate (SENOKOT-S) 8.6-50 MG tablet Take 1 tablet by mouth at bedtime as needed  for mild constipation. (Patient not taking: Reported on 08/25/2016) 20 tablet 0   No current facility-administered medications for this visit.    Facility-Administered Medications Ordered in Other Visits  Medication Dose Route Frequency Provider Last Rate Last Dose  . sodium chloride 0.9 % injection 10 mL  10 mL Intracatheter PRN [name], MD   10 mL at 07/10/14 1428     OBJECTIVE: Vitals:   08/25/16 0909  BP: 120/87  Pulse: (!) 127  Resp: 20  Temp: (!) 95.8 F (35.4 C)     Body mass index is 17.91 kg/m.    ECOG FS:1 - Symptomatic but completely ambulatory  General: Well-developed, well-nourished, no acute distress. Eyes: Pink conjunctiva, anicteric sclera. Lungs: Scattered wheezing throughout. Heart: Regular rate and rhythm. No rubs, murmurs, or gallops. Abdomen: Soft, nontender, nondistended. No organomegaly noted, normoactive bowel sounds. Musculoskeletal: No edema, cyanosis, or clubbing. Neuro: Alert, answering all questions appropriately. Cranial nerves grossly intact. Skin: No rashes or petechiae noted. Psych: Normal affect.   LAB RESULTS:  Lab Results  Component Value Date   NA 135 08/25/2016   K 3.8 08/25/2016   CL 98 (L) 08/25/2016   CO2 26 08/25/2016   GLUCOSE 108 (H) 08/25/2016   BUN 11 08/25/2016   CREATININE 0.71 08/25/2016   CALCIUM 9.3 08/25/2016   PROT 8.0 08/25/2016   ALBUMIN 2.9 (L) 08/25/2016   AST 49 (H) 08/25/2016   ALT 29 08/25/2016   ALKPHOS 454 (H) 08/25/2016   BILITOT 0.8 08/25/2016   GFRNONAA >60 08/25/2016   GFRAA >60 08/25/2016    Lab Results  Component Value Date   WBC 13.6 (H) 08/25/2016   NEUTROABS 10.2 (H) 08/25/2016   HGB 10.2 (L) 08/25/2016   HCT 30.0 (L) 08/25/2016   MCV 86.4 08/25/2016   PLT 340 08/25/2016   Lab Results  Component Value Date   CEA 715.6 (H) 08/11/2016     STUDIES: Ct Chest W Contrast  Result Date: 08/20/2016 CLINICAL DATA:  Restaging colon cancer. EXAM: CT CHEST, ABDOMEN, AND PELVIS WITH CONTRAST TECHNIQUE: Multidetector CT imaging of the chest, abdomen and pelvis was performed following the standard protocol during bolus administration of intravenous contrast. CONTRAST:  136m ISOVUE-300 IOPAMIDOL (ISOVUE-300) INJECTION 61% COMPARISON:  06/25/2016 PET-CT and prior CT scan 03/22/2016 FINDINGS: CT CHEST FINDINGS Cardiovascular: Stable old tortuosity and ectasia of  the thoracic aorta with a few scattered atherosclerotic calcifications but no focal aneurysm or dissection. The branch vessels are patent. Stable three-vessel coronary artery calcifications. Mediastinum/Nodes: Stable scattered mediastinal and hilar lymph nodes. 8.5 mm subcarinal lymph node is stable on image number 29 11 mm right hilar lymph node on image number 29 previously measured 7 mm. The esophagus is grossly normal. Lungs/Pleura: Stable old dense right upper lobe airspace consolidation with extensive bronchiectasis and scattered calcifications. Stable 10.5 mm left upper lobe pulmonary nodule along the major fissure. Interval enlargement of a left lower lobe subpleural nodule previously measuring 7 mm and now measuring 10.5 mm on image number 123. Stable right lower lobe scarring changes along with some new patchy atelectasis or scarring. Stable underling emphysematous changes. Chest wall/ Musculoskeletal: Mild bilateral gynecomastia noted. The left-sided Port-A-Cath appears stable. 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 No supraclavicular or axillary lymphadenopathy. The no significant bony findings. No evidence of osseous metastatic disease. CT ABDOMEN PELVIS FINDINGS Hepatobiliary: Significant and fairly rapid progression of hepatic metastatic disease when compared to the prior CT and PET-CT. The lesions are difficult to measure on  the prior PET-CT without contrast. Compare to the prior CT scan the dominant segment 8 liver lesion on image number 51 measures 7.0 x 6.7 cm and previously measured 5.4 x 4.4 cm. The large lesion straddling segment 2 and segment 4A on image number 61 measures 6.0 x 5.2 cm and previously measured 2.8 x 2.7 cm. 3.7 x 84.0 cm lesion in the lateral aspect of the segment 2 on image number 59 previously measured 19 x 14 mm. Segment 6 lesion on image number 68 measures 2.6 x 2.6 cm and previously measured 9 x 8 mm. Several other lesions are much larger.  Pancreas: No mass, inflammation or ductal dilatation. Spleen: Normal size.  No focal lesions. Adrenals/Urinary Tract: The adrenal glands are normal in stable. There are numerous bilateral renal cysts. No renal, ureteral or bladder calculi or worrisome mass. Stomach/Bowel: The stomach, duodenum, small bowel and colon are grossly normal. No acute inflammatory process, mass lesions or obstructive findings. The terminal ileum is normal. The appendix is normal. Stable surgical changes mid sigmoid colon area. Vascular/Lymphatic: Stable atherosclerotic calcifications involving the aorta iliac arteries. The branch vessels are patent. No aneurysm or dissection. Small scattered mesenteric and retroperitoneal lymph nodes appears stable. No obvious adenopathy. No definite omental or peritoneal surface disease. Reproductive: The prostate gland demonstrates asymmetric enhancement on the right side. Findings worrisome for prostate cancer. Recommend clinical correlation with digital rectal examination and PSA level. Other: No inguinal mass or adenopathy. No abdominal wall hernia or subcutaneous lesions. Musculoskeletal: No definite lytic or sclerotic metastatic bone lesions. IMPRESSION: 1. Significant interval progression of metastatic hepatic disease. 2. No abdominal/pelvic lymphadenopathy or definite peritoneal surface or omental disease. 3. Chronic dense right upper lobe airspace consolidation with extensive bronchiectasis and scattered calcifications. 4. Stable left upper lobe pulmonary nodule. 5. Enlarging left lower lobe pulmonary nodule. 6. Irregular asymmetric enhancement of the right aspect of the prostate gland worrisome for cancer. 7. No findings for osseous metastatic disease. 8. Stable atherosclerotic calcifications involving the thoracic and abdominal aorta and branch vessels including three-vessel coronary artery calcifications. Electronically Signed   By: Marijo Sanes M.D.   On: 08/20/2016 09:40   Ct Abdomen  Pelvis W Contrast  Result Date: 08/20/2016 CLINICAL DATA:  Restaging colon cancer. EXAM: CT CHEST, ABDOMEN, AND PELVIS WITH CONTRAST TECHNIQUE: Multidetector CT imaging of the chest, abdomen and pelvis was performed following the standard protocol during bolus administration of intravenous contrast. CONTRAST:  156m ISOVUE-300 IOPAMIDOL (ISOVUE-300) INJECTION 61% COMPARISON:  06/25/2016 PET-CT and prior CT scan 03/22/2016 FINDINGS: CT CHEST FINDINGS Cardiovascular: Stable old tortuosity and ectasia of the thoracic aorta with a few scattered atherosclerotic calcifications but no focal aneurysm or dissection. The branch vessels are patent. Stable three-vessel coronary artery calcifications. Mediastinum/Nodes: Stable scattered mediastinal and hilar lymph nodes. 8.5 mm subcarinal lymph node is stable on image number 29 11 mm right hilar lymph node on image number 29 previously measured 7 mm. The esophagus is grossly normal. Lungs/Pleura: Stable old dense right upper lobe airspace consolidation with extensive bronchiectasis and scattered calcifications. Stable 10.5 mm left upper lobe pulmonary nodule along the major fissure. Interval enlargement of a left lower lobe subpleural nodule previously measuring 7 mm and now measuring 10.5 mm on image number 123. Stable right lower lobe scarring changes along with some new patchy atelectasis or scarring. Stable underling emphysematous changes. Chest wall/ Musculoskeletal: Mild bilateral gynecomastia noted. The left-sided Port-A-Cath appears stable. 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 No supraclavicular or axillary  lymphadenopathy. The no significant bony findings. No evidence of osseous metastatic disease. CT ABDOMEN PELVIS FINDINGS Hepatobiliary: Significant and fairly rapid progression of hepatic metastatic disease when compared to the prior CT and PET-CT. The lesions are difficult to measure on the prior PET-CT without contrast. Compare to the  prior CT scan the dominant segment 8 liver lesion on image number 51 measures 7.0 x 6.7 cm and previously measured 5.4 x 4.4 cm. The large lesion straddling segment 2 and segment 4A on image number 61 measures 6.0 x 5.2 cm and previously measured 2.8 x 2.7 cm. 3.7 x 84.0 cm lesion in the lateral aspect of the segment 2 on image number 59 previously measured 19 x 14 mm. Segment 6 lesion on image number 68 measures 2.6 x 2.6 cm and previously measured 9 x 8 mm. Several other lesions are much larger. Pancreas: No mass, inflammation or ductal dilatation. Spleen: Normal size.  No focal lesions. Adrenals/Urinary Tract: The adrenal glands are normal in stable. There are numerous bilateral renal cysts. No renal, ureteral or bladder calculi or worrisome mass. Stomach/Bowel: The stomach, duodenum, small bowel and colon are grossly normal. No acute inflammatory process, mass lesions or obstructive findings. The terminal ileum is normal. The appendix is normal. Stable surgical changes mid sigmoid colon area. Vascular/Lymphatic: Stable atherosclerotic calcifications involving the aorta iliac arteries. The branch vessels are patent. No aneurysm or dissection. Small scattered mesenteric and retroperitoneal lymph nodes appears stable. No obvious adenopathy. No definite omental or peritoneal surface disease. Reproductive: The prostate gland demonstrates asymmetric enhancement on the right side. Findings worrisome for prostate cancer. Recommend clinical correlation with digital rectal examination and PSA level. Other: No inguinal mass or adenopathy. No abdominal wall hernia or subcutaneous lesions. Musculoskeletal: No definite lytic or sclerotic metastatic bone lesions. IMPRESSION: 1. Significant interval progression of metastatic hepatic disease. 2. No abdominal/pelvic lymphadenopathy or definite peritoneal surface or omental disease. 3. Chronic dense right upper lobe airspace consolidation with extensive bronchiectasis and  scattered calcifications. 4. Stable left upper lobe pulmonary nodule. 5. Enlarging left lower lobe pulmonary nodule. 6. Irregular asymmetric enhancement of the right aspect of the prostate gland worrisome for cancer. 7. No findings for osseous metastatic disease. 8. Stable atherosclerotic calcifications involving the thoracic and abdominal aorta and branch vessels including three-vessel coronary artery calcifications. Electronically Signed   By: Marijo Sanes M.D.   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 He received carboplatinum and etoposide along with XRT completing in approximately January 2015. Patient had a recurrence in his liver in September 2015 and was reinitiated on treatment with carboplatinum and etoposide again. He was then diagnosed with adenocarcinoma of the sigmoid colon with biopsy-proven metastasis to the liver in March 2016. K-ras wild-type. Patient initiated FOLFOX and Vectibix in April 2016. Patient was found to have a complete response, therefore was switched to Vectibix maintenance therapy in February 2017. Patient was then found to have progressive disease on a PET scan in May 2017 and was subsequently switched to [facility]. PET scan on December 08, 2015 revealed progressive disease and patient received 12 cycles of FOLFIRI between December 15, 2015 and May 26, 2016.   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  PLAN:  1. 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 Restaging CT scan reviewed independently and reported  as above with progression of disease. Patient's CEA is also increasing. Proceed with cycle 13 of FOLFIRI plus Avastin today. Return to clinic in 2 days for pump removal and then in 2 weeks for consideration of cycle 14. Will consider repeat imaging  after cycle 18.  2. Anemia: Hemoglobin improving, monitor. Patient does not require blood transfusion at this time.  3. Thrombocytopenia: Resolved. 4. History of stage IV small cell lung cancer: No obvious evidence of recurrence. Will continue to monitor with routine imaging. 5. Neutropenia: Currently within normal limits. Patient will require Neulasta with chemotherapy.  6. Mouth pain: Resolved. 7. Diarrhea: Resolved. Continue OTC lomotil as needed. 8. Poor appetite: Patient was given a refill of his dexamethasone 4 mg daily. 9. Shortness of breath: Patient was given a refill of his albuterol inhaler. Steroids as above. If no resolution, consider chest x-ray for further evaluation.   Patient expressed understanding and was in agreement with this plan. He also understands that He can call clinic at any time with any questions, concerns, or complaints.   Cancer Staging Carcinoma of sigmoid colon [facility]) Staging form: Colon and Rectum, AJCC 7th Edition - Clinical stage from 02/27/2016: Stage IVB (yT3, N1a, M1b) - Signed by Lloyd Huger, MD on 02/27/2016   Lloyd Huger, MD   08/27/2016 1:08 PM

## 2016-08-25 ENCOUNTER — Inpatient Hospital Stay: Payer: Medicare Other

## 2016-08-25 ENCOUNTER — Inpatient Hospital Stay (HOSPITAL_BASED_OUTPATIENT_CLINIC_OR_DEPARTMENT_OTHER): Payer: Medicare Other | Admitting: Oncology

## 2016-08-25 VITALS — BP 120/87 | HR 127 | Temp 95.8°F | Resp 20 | Wt 143.3 lb

## 2016-08-25 VITALS — BP 107/71 | HR 111

## 2016-08-25 DIAGNOSIS — Z87442 Personal history of urinary calculi: Secondary | ICD-10-CM

## 2016-08-25 DIAGNOSIS — Z79899 Other long term (current) drug therapy: Secondary | ICD-10-CM

## 2016-08-25 DIAGNOSIS — Z801 Family history of malignant neoplasm of trachea, bronchus and lung: Secondary | ICD-10-CM

## 2016-08-25 DIAGNOSIS — C187 Malignant neoplasm of sigmoid colon: Secondary | ICD-10-CM

## 2016-08-25 DIAGNOSIS — N62 Hypertrophy of breast: Secondary | ICD-10-CM | POA: Diagnosis not present

## 2016-08-25 DIAGNOSIS — Z85841 Personal history of malignant neoplasm of brain: Secondary | ICD-10-CM | POA: Diagnosis not present

## 2016-08-25 DIAGNOSIS — Z85118 Personal history of other malignant neoplasm of bronchus and lung: Secondary | ICD-10-CM

## 2016-08-25 DIAGNOSIS — Z7689 Persons encountering health services in other specified circumstances: Secondary | ICD-10-CM | POA: Diagnosis not present

## 2016-08-25 DIAGNOSIS — I700 Atherosclerosis of aorta: Secondary | ICD-10-CM | POA: Diagnosis not present

## 2016-08-25 DIAGNOSIS — Z9221 Personal history of antineoplastic chemotherapy: Secondary | ICD-10-CM | POA: Diagnosis not present

## 2016-08-25 DIAGNOSIS — Z87891 Personal history of nicotine dependence: Secondary | ICD-10-CM

## 2016-08-25 DIAGNOSIS — Z8546 Personal history of malignant neoplasm of prostate: Secondary | ICD-10-CM

## 2016-08-25 DIAGNOSIS — C787 Secondary malignant neoplasm of liver and intrahepatic bile duct: Secondary | ICD-10-CM

## 2016-08-25 DIAGNOSIS — R630 Anorexia: Secondary | ICD-10-CM | POA: Diagnosis not present

## 2016-08-25 DIAGNOSIS — D649 Anemia, unspecified: Secondary | ICD-10-CM

## 2016-08-25 DIAGNOSIS — J479 Bronchiectasis, uncomplicated: Secondary | ICD-10-CM | POA: Diagnosis not present

## 2016-08-25 DIAGNOSIS — Z923 Personal history of irradiation: Secondary | ICD-10-CM

## 2016-08-25 DIAGNOSIS — Z5112 Encounter for antineoplastic immunotherapy: Secondary | ICD-10-CM | POA: Diagnosis not present

## 2016-08-25 LAB — CBC WITH DIFFERENTIAL/PLATELET
BASOS PCT: 0 %
Basophils Absolute: 0 10*3/uL (ref 0–0.1)
EOS ABS: 0 10*3/uL (ref 0–0.7)
Eosinophils Relative: 0 %
HCT: 30 % — ABNORMAL LOW (ref 40.0–52.0)
HEMOGLOBIN: 10.2 g/dL — AB (ref 13.0–18.0)
LYMPHS ABS: 2.1 10*3/uL (ref 1.0–3.6)
Lymphocytes Relative: 15 %
MCH: 29.3 pg (ref 26.0–34.0)
MCHC: 33.9 g/dL (ref 32.0–36.0)
MCV: 86.4 fL (ref 80.0–100.0)
MONO ABS: 1.3 10*3/uL — AB (ref 0.2–1.0)
MONOS PCT: 10 %
NEUTROS PCT: 75 %
Neutro Abs: 10.2 10*3/uL — ABNORMAL HIGH (ref 1.4–6.5)
Platelets: 340 10*3/uL (ref 150–440)
RBC: 3.47 MIL/uL — ABNORMAL LOW (ref 4.40–5.90)
RDW: 16.4 % — AB (ref 11.5–14.5)
WBC: 13.6 10*3/uL — ABNORMAL HIGH (ref 3.8–10.6)

## 2016-08-25 LAB — COMPREHENSIVE METABOLIC PANEL
ALK PHOS: 454 U/L — AB (ref 38–126)
ALT: 29 U/L (ref 17–63)
ANION GAP: 11 (ref 5–15)
AST: 49 U/L — ABNORMAL HIGH (ref 15–41)
Albumin: 2.9 g/dL — ABNORMAL LOW (ref 3.5–5.0)
BILIRUBIN TOTAL: 0.8 mg/dL (ref 0.3–1.2)
BUN: 11 mg/dL (ref 6–20)
CALCIUM: 9.3 mg/dL (ref 8.9–10.3)
CO2: 26 mmol/L (ref 22–32)
Chloride: 98 mmol/L — ABNORMAL LOW (ref 101–111)
Creatinine, Ser: 0.71 mg/dL (ref 0.61–1.24)
GFR calc non Af Amer: >60 mL/min (ref >60)
Glucose, Bld: 108 mg/dL — ABNORMAL HIGH (ref 65–99)
POTASSIUM: 3.8 mmol/L (ref 3.5–5.1)
SODIUM: 135 mmol/L (ref 135–145)
TOTAL PROTEIN: 8 g/dL (ref 6.5–8.1)

## 2016-08-25 LAB — SAMPLE TO BLOOD BANK

## 2016-08-25 LAB — PROTEIN, URINE, RANDOM: Total Protein, Urine: 90 mg/dL

## 2016-08-25 MED ORDER — LEUCOVORIN CALCIUM INJECTION 350 MG
750.0000 mg | Freq: Once | INTRAVENOUS | Status: AC
  Administered 2016-08-25: 750 mg via INTRAVENOUS
  Filled 2016-08-25: qty 25

## 2016-08-25 MED ORDER — FLUOROURACIL CHEMO INJECTION 2.5 GM/50ML
400.0000 mg/m2 | Freq: Once | INTRAVENOUS | Status: AC
  Administered 2016-08-25: 750 mg via INTRAVENOUS
  Filled 2016-08-25: qty 15

## 2016-08-25 MED ORDER — SODIUM CHLORIDE 0.9% FLUSH
10.0000 mL | Freq: Once | INTRAVENOUS | Status: AC
  Administered 2016-08-25: 10 mL via INTRAVENOUS
  Filled 2016-08-25: qty 10

## 2016-08-25 MED ORDER — ATROPINE SULFATE 1 MG/ML IJ SOLN
0.5000 mg | Freq: Once | INTRAMUSCULAR | Status: AC | PRN
  Administered 2016-08-25: 0.5 mg via INTRAVENOUS

## 2016-08-25 MED ORDER — DEXAMETHASONE 4 MG PO TABS: 4.0000 mg | ORAL_TABLET | Freq: Every day | ORAL | 1 refills | Status: DC

## 2016-08-25 MED ORDER — SODIUM CHLORIDE 0.9 % IV SOLN
300.0000 mg | Freq: Once | INTRAVENOUS | Status: AC
  Administered 2016-08-25: 300 mg via INTRAVENOUS
  Filled 2016-08-25: qty 12

## 2016-08-25 MED ORDER — ALBUTEROL SULFATE HFA 108 (90 BASE) MCG/ACT IN AERS: 2.0000 | INHALATION_SPRAY | RESPIRATORY_TRACT | 2 refills | Status: DC | PRN

## 2016-08-25 MED ORDER — IRINOTECAN HCL CHEMO INJECTION 100 MG/5ML
180.0000 mg/m2 | Freq: Once | INTRAVENOUS | Status: AC
  Administered 2016-08-25: 340 mg via INTRAVENOUS
  Filled 2016-08-25: qty 15

## 2016-08-25 MED ORDER — DEXAMETHASONE SODIUM PHOSPHATE 10 MG/ML IJ SOLN
10.0000 mg | Freq: Once | INTRAMUSCULAR | Status: AC
  Administered 2016-08-25: 10 mg via INTRAVENOUS

## 2016-08-25 MED ORDER — SODIUM CHLORIDE 0.9 % IV SOLN: 10.0000 mg | Freq: Once | INTRAVENOUS | Status: DC

## 2016-08-25 MED ORDER — SODIUM CHLORIDE 0.9 % IV SOLN
2400.0000 mg/m2 | INTRAVENOUS | Status: DC
  Administered 2016-08-25: 4450 mg via INTRAVENOUS
  Filled 2016-08-25: qty 89

## 2016-08-25 MED ORDER — PALONOSETRON HCL INJECTION 0.25 MG/5ML
0.2500 mg | Freq: Once | INTRAVENOUS | Status: AC
  Administered 2016-08-25: 0.25 mg via INTRAVENOUS

## 2016-08-25 MED ORDER — SODIUM CHLORIDE 0.9 % IV SOLN
Freq: Once | INTRAVENOUS | Status: AC
  Administered 2016-08-25: 11:00:00 via INTRAVENOUS
  Filled 2016-08-25: qty 1000

## 2016-08-25 MED ORDER — HEPARIN SOD (PORK) LOCK FLUSH 100 UNIT/ML IV SOLN: 500.0000 [IU] | Freq: Once | INTRAVENOUS | Status: DC

## 2016-08-25 NOTE — Progress Notes (Signed)
Patient here today for ongoing follow up and treatment consideration regarding colon cancer. Patient reports worsening shortness of breath over the past week. Patient also reports decreased appetite today.

## 2016-08-27 ENCOUNTER — Inpatient Hospital Stay: Payer: Medicare Other

## 2016-08-27 VITALS — BP 114/77 | HR 96 | Temp 95.4°F | Resp 20

## 2016-08-27 DIAGNOSIS — C187 Malignant neoplasm of sigmoid colon: Secondary | ICD-10-CM

## 2016-08-27 DIAGNOSIS — Z85841 Personal history of malignant neoplasm of brain: Secondary | ICD-10-CM | POA: Diagnosis not present

## 2016-08-27 DIAGNOSIS — Z85118 Personal history of other malignant neoplasm of bronchus and lung: Secondary | ICD-10-CM | POA: Diagnosis not present

## 2016-08-27 DIAGNOSIS — C787 Secondary malignant neoplasm of liver and intrahepatic bile duct: Secondary | ICD-10-CM | POA: Diagnosis not present

## 2016-08-27 DIAGNOSIS — Z5112 Encounter for antineoplastic immunotherapy: Secondary | ICD-10-CM | POA: Diagnosis not present

## 2016-08-27 DIAGNOSIS — Z7689 Persons encountering health services in other specified circumstances: Secondary | ICD-10-CM | POA: Diagnosis not present

## 2016-08-27 LAB — CEA: CEA1: 985.1 ng/mL — AB (ref 0.0–4.7)

## 2016-08-27 MED ORDER — SODIUM CHLORIDE 0.9% FLUSH
10.0000 mL | INTRAVENOUS | Status: DC | PRN
  Administered 2016-08-27: 10 mL
  Filled 2016-08-27: qty 10

## 2016-08-27 MED ORDER — HEPARIN SOD (PORK) LOCK FLUSH 100 UNIT/ML IV SOLN
500.0000 [IU] | Freq: Once | INTRAVENOUS | Status: AC | PRN
  Administered 2016-08-27: 500 [IU]
  Filled 2016-08-27: qty 5

## 2016-08-27 MED ORDER — PEGFILGRASTIM INJECTION 6 MG/0.6ML ~~LOC~~
PREFILLED_SYRINGE | SUBCUTANEOUS | Status: AC
  Filled 2016-08-27: qty 0.6

## 2016-08-27 MED ORDER — PEGFILGRASTIM INJECTION 6 MG/0.6ML ~~LOC~~
6.0000 mg | PREFILLED_SYRINGE | Freq: Once | SUBCUTANEOUS | Status: AC
  Administered 2016-08-27: 6 mg via SUBCUTANEOUS
  Filled 2016-08-27: qty 0.6

## 2016-09-07 NOTE — Progress Notes (Signed)
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  INTERVAL HISTORY: Patient returns to clinic today for further evaluation, laboratory work, and cycle 14 . He notes continued shortness of breath and cough. Cough is worse during the day and better at night. He admits to mild sputum production that is green/brown in color. His appetite is much better since starting the steroids. He otherwise feels well. He has no neurologic complaints. He denies any recent fevers. He denies chest pain.  He denies any nausea or vomiting. He has no urinary complaints. Patient offers no  further specific complaints today.   REVIEW OF SYSTEMS:   Review of Systems  Constitutional: Negative for fever, malaise/fatigue and weight loss.  Respiratory: Positive for cough, sputum production, shortness of breath and wheezing. Negative for hemoptysis.   Cardiovascular: Negative.  Negative for chest pain and leg swelling.  Gastrointestinal: Negative for abdominal pain, blood in stool, constipation, diarrhea and melena.  Genitourinary: Negative.   Musculoskeletal: Negative.   Skin: Negative.  Negative for rash.  Neurological: Negative for sensory change and weakness.  Psychiatric/Behavioral: Negative.  The patient is not nervous/anxious.     As per HPI. Otherwise, a complete review of systems is negative.  PAST MEDICAL HISTORY: Past Medical History:  Diagnosis Date  . Brain cancer ([place]) 01/2014   Chemotherapy and radiation treatment  . Colon cancer ([place]) 01/2014  . Kidney stones 1975  . Liver cancer ([place])   . Lung cancer [facility]) 2014   Radiation therapy  . Prostate cancer [facility]) 2005    treated by Dr Eliberto Ivory    PAST SURGICAL HISTORY: Past Surgical History:  Procedure Laterality Date  . COLON SURGERY  05-03-14   Resection of Sigmoid Colon and liver biopsy  . kidney stones  1975  . PORTACATH PLACEMENT  2014  . PROSTATE SURGERY  2005   partial removal due to cancer    FAMILY HISTORY: Family History  Problem Relation Age of Onset  . Lung cancer Father   . Alzheimer's disease Mother     ADVANCED DIRECTIVES (Y/N):  N  HEALTH MAINTENANCE: Social History  Substance Use Topics  . Smoking status: Former Smoker    Years: 35.00  . Smokeless tobacco: Never Used  . Alcohol use No     Colonoscopy:  PAP:  Bone density:  Lipid panel:  No Known Allergies  Current Outpatient Prescriptions  Medication Sig Dispense Refill  . albuterol (PROVENTIL HFA;VENTOLIN HFA) 108 (90 Base) MCG/ACT inhaler Inhale 2 puffs into the lungs every 4 (four) hours as needed for wheezing or shortness of breath. 1 Inhaler 2  . carvedilol (COREG) 3.125 MG tablet Take 1 tablet (3.125 mg total) by mouth 2 (two) times daily with a meal. (Patient not taking: Reported on 08/25/2016) 60 tablet 0  . dexamethasone (DECADRON) 4 MG tablet Take 1 tablet (4 mg total) by mouth daily. 30 tablet 1  . HYDROcodone-acetaminophen (NORCO) 5-325 MG tablet Take 1 tablet by mouth every 6 (six) hours as needed for severe pain. (Patient not taking: Reported on 08/25/2016) 30 tablet 0  . potassium chloride (K-DUR) 10 MEQ tablet Take 1 tablet (10 mEq total) by mouth daily. (Patient not taking: Reported on 08/25/2016) 10 tablet  0  . senna-docusate (SENOKOT-S) 8.6-50 MG tablet Take 1 tablet by mouth at bedtime as needed for mild constipation. (Patient not taking: Reported on 08/25/2016) 20 tablet 0   No current facility-administered medications for this visit.    Facility-Administered Medications Ordered in Other Visits  Medication Dose Route Frequency Provider Last Rate Last Dose  . sodium chloride 0.9 % injection 10 mL  10  mL Intracatheter PRN [name], MD   10 mL at 07/10/14 1428    OBJECTIVE: There were no vitals filed for this visit.   There is no height or weight on file to calculate BMI.    ECOG FS:1 - Symptomatic but completely ambulatory  General: Well-developed, well-nourished, no acute distress. Eyes: Pink conjunctiva, anicteric sclera. Lungs: Scattered wheezing throughout. Heart: Regular rate and rhythm. No rubs, murmurs, or gallops. Abdomen: Soft, nontender, nondistended. No organomegaly noted, normoactive bowel sounds. Musculoskeletal: No edema, cyanosis, or clubbing. Neuro: Alert, answering all questions appropriately. Cranial nerves grossly intact. Skin: No rashes or petechiae noted. Psych: Normal affect.   LAB RESULTS:  Lab Results  Component Value Date   NA 135 08/25/2016   K 3.8 08/25/2016   CL 98 (L) 08/25/2016   CO2 26 08/25/2016   GLUCOSE 108 (H) 08/25/2016   BUN 11 08/25/2016   CREATININE 0.71 08/25/2016   CALCIUM 9.3 08/25/2016   PROT 8.0 08/25/2016   ALBUMIN 2.9 (L) 08/25/2016   AST 49 (H) 08/25/2016   ALT 29 08/25/2016   ALKPHOS 454 (H) 08/25/2016   BILITOT 0.8 08/25/2016   GFRNONAA >60 08/25/2016   GFRAA >60 08/25/2016    Lab Results  Component Value Date   WBC 13.6 (H) 08/25/2016   NEUTROABS 10.2 (H) 08/25/2016   HGB 10.2 (L) 08/25/2016   HCT 30.0 (L) 08/25/2016   MCV 86.4 08/25/2016   PLT 340 08/25/2016   Lab Results  Component Value Date   CEA 715.6 (H) 08/11/2016     STUDIES: Ct Chest W Contrast  Result Date: 08/20/2016 CLINICAL DATA:  Restaging colon cancer. EXAM: CT CHEST, ABDOMEN, AND PELVIS WITH CONTRAST TECHNIQUE: Multidetector CT imaging of the chest, abdomen and pelvis was performed following the standard protocol during bolus administration of intravenous contrast. CONTRAST:  153m ISOVUE-300 IOPAMIDOL (ISOVUE-300) INJECTION 61% COMPARISON:  06/25/2016 PET-CT and prior CT scan 03/22/2016 FINDINGS: CT CHEST FINDINGS Cardiovascular: Stable  old tortuosity and ectasia of the thoracic aorta with a few scattered atherosclerotic calcifications but no focal aneurysm or dissection. The branch vessels are patent. Stable three-vessel coronary artery calcifications. Mediastinum/Nodes: Stable scattered mediastinal and hilar lymph nodes. 8.5 mm subcarinal lymph node is stable on image number 29 11 mm right hilar lymph node on image number 29 previously measured 7 mm. The esophagus is grossly normal. Lungs/Pleura: Stable old dense right upper lobe airspace consolidation with extensive bronchiectasis and scattered calcifications. Stable 10.5 mm left upper lobe pulmonary nodule along the major fissure. Interval enlargement of a left lower lobe subpleural nodule previously measuring 7 mm and now measuring 10.5 mm on image number 123. Stable right lower lobe scarring changes along with some new patchy atelectasis or scarring. Stable underling emphysematous changes. Chest wall/ Musculoskeletal: Mild bilateral gynecomastia noted. The left-sided Port-A-Cath appears stable. 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 No supraclavicular or axillary lymphadenopathy. The no significant bony findings. No evidence of osseous metastatic disease. CT ABDOMEN PELVIS FINDINGS Hepatobiliary: Significant and fairly rapid progression of hepatic metastatic disease when compared to the prior CT and PET-CT. The  lesions are difficult to measure on the prior PET-CT without contrast. Compare to the prior CT scan the dominant segment 8 liver lesion on image number 51 measures 7.0 x 6.7 cm and previously measured 5.4 x 4.4 cm. The large lesion straddling segment 2 and segment 4A on image number 61 measures 6.0 x 5.2 cm and previously measured 2.8 x 2.7 cm. 3.7 x 84.0 cm lesion in the lateral aspect of the segment 2 on image number 59 previously measured 19 x 14 mm. Segment 6 lesion on image number 68 measures 2.6 x 2.6 cm and previously measured 9 x 8 mm. Several other  lesions are much larger. Pancreas: No mass, inflammation or ductal dilatation. Spleen: Normal size.  No focal lesions. Adrenals/Urinary Tract: The adrenal glands are normal in stable. There are numerous bilateral renal cysts. No renal, ureteral or bladder calculi or worrisome mass. Stomach/Bowel: The stomach, duodenum, small bowel and colon are grossly normal. No acute inflammatory process, mass lesions or obstructive findings. The terminal ileum is normal. The appendix is normal. Stable surgical changes mid sigmoid colon area. Vascular/Lymphatic: Stable atherosclerotic calcifications involving the aorta iliac arteries. The branch vessels are patent. No aneurysm or dissection. Small scattered mesenteric and retroperitoneal lymph nodes appears stable. No obvious adenopathy. No definite omental or peritoneal surface disease. Reproductive: The prostate gland demonstrates asymmetric enhancement on the right side. Findings worrisome for prostate cancer. Recommend clinical correlation with digital rectal examination and PSA level. Other: No inguinal mass or adenopathy. No abdominal wall hernia or subcutaneous lesions. Musculoskeletal: No definite lytic or sclerotic metastatic bone lesions. IMPRESSION: 1. Significant interval progression of metastatic hepatic disease. 2. No abdominal/pelvic lymphadenopathy or definite peritoneal surface or omental disease. 3. Chronic dense right upper lobe airspace consolidation with extensive bronchiectasis and scattered calcifications. 4. Stable left upper lobe pulmonary nodule. 5. Enlarging left lower lobe pulmonary nodule. 6. Irregular asymmetric enhancement of the right aspect of the prostate gland worrisome for cancer. 7. No findings for osseous metastatic disease. 8. Stable atherosclerotic calcifications involving the thoracic and abdominal aorta and branch vessels including three-vessel coronary artery calcifications. Electronically Signed   By: Marijo Sanes M.D.   On: 08/20/2016  09:40   Ct Abdomen Pelvis W Contrast  Result Date: 08/20/2016 CLINICAL DATA:  Restaging colon cancer. EXAM: CT CHEST, ABDOMEN, AND PELVIS WITH CONTRAST TECHNIQUE: Multidetector CT imaging of the chest, abdomen and pelvis was performed following the standard protocol during bolus administration of intravenous contrast. CONTRAST:  124m ISOVUE-300 IOPAMIDOL (ISOVUE-300) INJECTION 61% COMPARISON:  06/25/2016 PET-CT and prior CT scan 03/22/2016 FINDINGS: CT CHEST FINDINGS Cardiovascular: Stable old tortuosity and ectasia of the thoracic aorta with a few scattered atherosclerotic calcifications but no focal aneurysm or dissection. The branch vessels are patent. Stable three-vessel coronary artery calcifications. Mediastinum/Nodes: Stable scattered mediastinal and hilar lymph nodes. 8.5 mm subcarinal lymph node is stable on image number 29 11 mm right hilar lymph node on image number 29 previously measured 7 mm. The esophagus is grossly normal. Lungs/Pleura: Stable old dense right upper lobe airspace consolidation with extensive bronchiectasis and scattered calcifications. Stable 10.5 mm left upper lobe pulmonary nodule along the major fissure. Interval enlargement of a left lower lobe subpleural nodule previously measuring 7 mm and now measuring 10.5 mm on image number 123. Stable right lower lobe scarring changes along with some new patchy atelectasis or scarring. Stable underling emphysematous changes. Chest wall/ Musculoskeletal: Mild bilateral gynecomastia noted. The left-sided Port-A-Cath appears stable. Chest wall collaterals likely due to left subclavian  vein stenosis. No supraclavicular or axillary lymphadenopathy. The no significant bony findings. No evidence of osseous metastatic disease. CT ABDOMEN PELVIS FINDINGS Hepatobiliary: Significant and fairly rapid progression of hepatic metastatic disease when compared to the prior CT and PET-CT. The lesions are difficult to measure on the prior PET-CT without  contrast. Compare to the prior CT scan the dominant segment 8 liver lesion on image number 51 measures 7.0 x 6.7 cm and previously measured 5.4 x 4.4 cm. The large lesion straddling segment 2 and segment 4A on image number 61 measures 6.0 x 5.2 cm and previously measured 2.8 x 2.7 cm. 3.7 x 84.0 cm lesion in the lateral aspect of the segment 2 on image number 59 previously measured 19 x 14 mm. Segment 6 lesion on image number 68 measures 2.6 x 2.6 cm and previously measured 9 x 8 mm. Several other lesions are much larger. Pancreas: No mass, inflammation or ductal dilatation. Spleen: Normal size.  No focal lesions. Adrenals/Urinary Tract: The adrenal glands are normal in stable. There are numerous bilateral renal cysts. No renal, ureteral or bladder calculi or worrisome mass. Stomach/Bowel: The stomach, duodenum, small bowel and colon are grossly normal. No acute inflammatory process, mass lesions or obstructive findings. The terminal ileum is normal. The appendix is normal. Stable surgical changes mid sigmoid colon area. Vascular/Lymphatic: Stable atherosclerotic calcifications involving the aorta iliac arteries. The branch vessels are patent. No aneurysm or dissection. Small scattered mesenteric and retroperitoneal lymph nodes appears stable. No obvious adenopathy. No definite omental or peritoneal surface disease. Reproductive: The prostate gland demonstrates asymmetric enhancement on the right side. Findings worrisome for prostate cancer. Recommend clinical correlation with digital rectal examination and PSA level. Other: No inguinal mass or adenopathy. No abdominal wall hernia or subcutaneous lesions. Musculoskeletal: No definite lytic or sclerotic metastatic bone lesions. IMPRESSION: 1. Significant interval progression of metastatic hepatic disease. 2. No abdominal/pelvic lymphadenopathy or definite peritoneal surface or omental disease. 3. Chronic dense right upper lobe airspace consolidation with extensive  bronchiectasis and scattered calcifications. 4. Stable left upper lobe pulmonary nodule. 5. Enlarging left lower lobe pulmonary nodule. 6. Irregular asymmetric enhancement of the right aspect of the prostate gland worrisome for cancer. 7. No findings for osseous metastatic disease. 8. Stable atherosclerotic calcifications involving the thoracic and abdominal aorta and branch vessels including three-vessel coronary artery calcifications. Electronically Signed   By: Marijo Sanes M.D.   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 He received carboplatinum and etoposide along with XRT completing in approximately January 2015. Patient had a recurrence in his liver in September 2015 and was reinitiated on treatment with carboplatinum and etoposide again. He was then diagnosed with adenocarcinoma of the sigmoid colon with biopsy-proven metastasis to the liver in March 2016. K-ras wild-type. Patient initiated FOLFOX and Vectibix in April 2016. Patient was found to have a complete response, therefore was switched to Vectibix maintenance therapy in February 2017. Patient was then found to have progressive disease on a PET scan in May 2017 and was subsequently switched to [facility]. PET scan on December 08, 2015 revealed progressive disease and patient received 12 cycles of FOLFIRI between December 15, 2015 and May 26, 2016.   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  PLAN:  1. 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 Restaging  CT scan reviewed independently and reported as above with progression of disease. Patient's CEA is also increasing.  Proceed with cycle 14 of FOLFIRI plus Avastin today. Return to clinic in 2 days for pump removal and then in 2 weeks for consideration of cycle 15. Will  consider repeat imaging after cycle 18.  2. Anemia: Hemoglobin stable. Patient does not require blood transfusion at this time.  3. Thrombocytopenia: Resolved. 4. History of stage IV small cell lung cancer: No obvious evidence of recurrence. Will continue to monitor with routine imaging. 5. Neutropenia: Currently within normal limits. Patient will require Neulasta with chemotherapy.  6. Mouth pain: Resolved. 7. Diarrhea: Resolved. Continue OTC lomotil as needed. 8. Poor appetite: Much better with the addition of dexamethasone 4 mg daily. 9. Shortness of breath: Continue Ventolin inhaler BID. Continue steroids. Chest X-ray ordered for Friday when he returns for pump removal.    Patient expressed understanding and was in agreement with this plan. He also understands that He can call clinic at any time with any questions, concerns, or complaints.   Cancer Staging Carcinoma of sigmoid colon [facility]) Staging form: Colon and Rectum, AJCC 7th Edition - Clinical stage from 02/27/2016: Stage IVB (yT3, N1a, M1b) - Signed by Lloyd Huger, MD on 02/27/2016   Lloyd Huger, MD   09/07/2016 9:15 PM

## 2016-09-08 ENCOUNTER — Inpatient Hospital Stay (HOSPITAL_BASED_OUTPATIENT_CLINIC_OR_DEPARTMENT_OTHER): Payer: Medicare Other | Admitting: Oncology

## 2016-09-08 ENCOUNTER — Inpatient Hospital Stay: Payer: Medicare Other

## 2016-09-08 ENCOUNTER — Other Ambulatory Visit: Payer: Self-pay

## 2016-09-08 VITALS — BP 132/88 | HR 106 | Temp 96.8°F | Resp 20 | Wt 143.2 lb

## 2016-09-08 VITALS — BP 129/83 | HR 103 | Resp 20

## 2016-09-08 DIAGNOSIS — R0609 Other forms of dyspnea: Secondary | ICD-10-CM | POA: Diagnosis not present

## 2016-09-08 DIAGNOSIS — C187 Malignant neoplasm of sigmoid colon: Secondary | ICD-10-CM

## 2016-09-08 DIAGNOSIS — N62 Hypertrophy of breast: Secondary | ICD-10-CM | POA: Diagnosis not present

## 2016-09-08 DIAGNOSIS — C787 Secondary malignant neoplasm of liver and intrahepatic bile duct: Secondary | ICD-10-CM

## 2016-09-08 DIAGNOSIS — R630 Anorexia: Secondary | ICD-10-CM

## 2016-09-08 DIAGNOSIS — Z87442 Personal history of urinary calculi: Secondary | ICD-10-CM

## 2016-09-08 DIAGNOSIS — Z801 Family history of malignant neoplasm of trachea, bronchus and lung: Secondary | ICD-10-CM

## 2016-09-08 DIAGNOSIS — D649 Anemia, unspecified: Secondary | ICD-10-CM

## 2016-09-08 DIAGNOSIS — Z87891 Personal history of nicotine dependence: Secondary | ICD-10-CM

## 2016-09-08 DIAGNOSIS — Z7689 Persons encountering health services in other specified circumstances: Secondary | ICD-10-CM | POA: Diagnosis not present

## 2016-09-08 DIAGNOSIS — Z85118 Personal history of other malignant neoplasm of bronchus and lung: Secondary | ICD-10-CM

## 2016-09-08 DIAGNOSIS — Z9221 Personal history of antineoplastic chemotherapy: Secondary | ICD-10-CM

## 2016-09-08 DIAGNOSIS — J479 Bronchiectasis, uncomplicated: Secondary | ICD-10-CM

## 2016-09-08 DIAGNOSIS — Z8546 Personal history of malignant neoplasm of prostate: Secondary | ICD-10-CM

## 2016-09-08 DIAGNOSIS — Z5112 Encounter for antineoplastic immunotherapy: Secondary | ICD-10-CM | POA: Diagnosis not present

## 2016-09-08 DIAGNOSIS — I700 Atherosclerosis of aorta: Secondary | ICD-10-CM

## 2016-09-08 DIAGNOSIS — Z85841 Personal history of malignant neoplasm of brain: Secondary | ICD-10-CM

## 2016-09-08 DIAGNOSIS — Z79899 Other long term (current) drug therapy: Secondary | ICD-10-CM

## 2016-09-08 DIAGNOSIS — Z923 Personal history of irradiation: Secondary | ICD-10-CM | POA: Diagnosis not present

## 2016-09-08 LAB — CBC WITH DIFFERENTIAL/PLATELET
BASOS ABS: 0.1 10*3/uL (ref 0–0.1)
Basophils Relative: 1 %
EOS PCT: 0 %
Eosinophils Absolute: 0 10*3/uL (ref 0–0.7)
HEMATOCRIT: 29.8 % — AB (ref 40.0–52.0)
Hemoglobin: 10.1 g/dL — ABNORMAL LOW (ref 13.0–18.0)
LYMPHS ABS: 2.3 10*3/uL (ref 1.0–3.6)
LYMPHS PCT: 23 %
MCH: 28.8 pg (ref 26.0–34.0)
MCHC: 34 g/dL (ref 32.0–36.0)
MCV: 84.8 fL (ref 80.0–100.0)
MONO ABS: 1 10*3/uL (ref 0.2–1.0)
Monocytes Relative: 10 %
NEUTROS ABS: 6.7 10*3/uL — AB (ref 1.4–6.5)
Neutrophils Relative %: 66 %
PLATELETS: 160 10*3/uL (ref 150–440)
RBC: 3.52 MIL/uL — AB (ref 4.40–5.90)
RDW: 17.2 % — ABNORMAL HIGH (ref 11.5–14.5)
WBC: 10.1 10*3/uL (ref 3.8–10.6)

## 2016-09-08 LAB — COMPREHENSIVE METABOLIC PANEL
ALT: 31 U/L (ref 17–63)
AST: 32 U/L (ref 15–41)
Albumin: 2.7 g/dL — ABNORMAL LOW (ref 3.5–5.0)
Alkaline Phosphatase: 214 U/L — ABNORMAL HIGH (ref 38–126)
Anion gap: 9 (ref 5–15)
BILIRUBIN TOTAL: 0.4 mg/dL (ref 0.3–1.2)
BUN: 16 mg/dL (ref 6–20)
CALCIUM: 9 mg/dL (ref 8.9–10.3)
CHLORIDE: 101 mmol/L (ref 101–111)
CO2: 25 mmol/L (ref 22–32)
CREATININE: 0.71 mg/dL (ref 0.61–1.24)
GFR calc Af Amer: >60 mL/min (ref >60)
Glucose, Bld: 109 mg/dL — ABNORMAL HIGH (ref 65–99)
Potassium: 3.1 mmol/L — ABNORMAL LOW (ref 3.5–5.1)
Sodium: 135 mmol/L (ref 135–145)
TOTAL PROTEIN: 7 g/dL (ref 6.5–8.1)

## 2016-09-08 MED ORDER — PALONOSETRON HCL INJECTION 0.25 MG/5ML
0.2500 mg | Freq: Once | INTRAVENOUS | Status: AC
Start: 2016-09-08 — End: 2016-09-08
  Administered 2016-09-08: 0.25 mg via INTRAVENOUS
  Filled 2016-09-08: qty 5

## 2016-09-08 MED ORDER — SODIUM CHLORIDE 0.9% FLUSH
10.0000 mL | INTRAVENOUS | Status: DC | PRN
  Administered 2016-09-08: 10 mL via INTRAVENOUS
  Filled 2016-09-08: qty 10

## 2016-09-08 MED ORDER — LEUCOVORIN CALCIUM INJECTION 350 MG
750.0000 mg | Freq: Once | INTRAVENOUS | Status: AC
  Administered 2016-09-08: 750 mg via INTRAVENOUS
  Filled 2016-09-08: qty 37.5

## 2016-09-08 MED ORDER — ATROPINE SULFATE 1 MG/ML IJ SOLN
0.5000 mg | Freq: Once | INTRAMUSCULAR | Status: AC | PRN
  Administered 2016-09-08: 0.5 mg via INTRAVENOUS
  Filled 2016-09-08: qty 1

## 2016-09-08 MED ORDER — SODIUM CHLORIDE 0.9 % IV SOLN
Freq: Once | INTRAVENOUS | Status: AC
  Administered 2016-09-08: 10:00:00 via INTRAVENOUS
  Filled 2016-09-08: qty 1000

## 2016-09-08 MED ORDER — FLUOROURACIL CHEMO INJECTION 5 GM/100ML
2400.0000 mg/m2 | INTRAVENOUS | Status: DC
  Administered 2016-09-08: 4450 mg via INTRAVENOUS
  Filled 2016-09-08: qty 89

## 2016-09-08 MED ORDER — SODIUM CHLORIDE 0.9 % IV SOLN
300.0000 mg | Freq: Once | INTRAVENOUS | Status: AC
  Administered 2016-09-08: 300 mg via INTRAVENOUS
  Filled 2016-09-08: qty 12

## 2016-09-08 MED ORDER — DEXAMETHASONE SODIUM PHOSPHATE 10 MG/ML IJ SOLN
10.0000 mg | Freq: Once | INTRAMUSCULAR | Status: AC
Start: 2016-09-08 — End: 2016-09-08
  Administered 2016-09-08: 10 mg via INTRAVENOUS
  Filled 2016-09-08: qty 1

## 2016-09-08 MED ORDER — SODIUM CHLORIDE 0.9 % IV SOLN: 10.0000 mg | Freq: Once | INTRAVENOUS | Status: DC

## 2016-09-08 MED ORDER — FLUOROURACIL CHEMO INJECTION 2.5 GM/50ML
400.0000 mg/m2 | Freq: Once | INTRAVENOUS | Status: AC
  Administered 2016-09-08: 750 mg via INTRAVENOUS
  Filled 2016-09-08: qty 15

## 2016-09-08 MED ORDER — DEXTROSE 5 % IV SOLN
180.0000 mg/m2 | Freq: Once | INTRAVENOUS | Status: AC
  Administered 2016-09-08: 340 mg via INTRAVENOUS
  Filled 2016-09-08: qty 15

## 2016-09-08 NOTE — Progress Notes (Signed)
Reviewed vitals with Sonia Baller, NP, proceed with treatment today and with treatment plan dated 09/15/2016. LJ

## 2016-09-08 NOTE — Progress Notes (Signed)
Patient reports appetite is much improved, denies pain today. Patient reports continued shortness of breath on exertion, O2 saturation 98% this morning.

## 2016-09-09 LAB — CEA: CEA: 789.4 ng/mL — ABNORMAL HIGH (ref 0.0–4.7)

## 2016-09-10 ENCOUNTER — Inpatient Hospital Stay: Payer: Medicare Other

## 2016-09-10 ENCOUNTER — Other Ambulatory Visit
Admission: RE | Admit: 2016-09-10 | Discharge: 2016-09-10 | Disposition: A | Discharge status: Home / Self Care | Payer: Medicare Other | Source: Ambulatory Visit | Attending: Oncology | Admitting: Oncology

## 2016-09-10 ENCOUNTER — Ambulatory Visit
Admission: RE | Admit: 2016-09-10 | Discharge: 2016-09-10 | Disposition: A | Discharge status: Home / Self Care | Payer: Medicare Other | Source: Ambulatory Visit | Attending: Oncology | Admitting: Oncology

## 2016-09-10 VITALS — BP 106/75 | HR 105 | Temp 96.0°F | Resp 20

## 2016-09-10 DIAGNOSIS — C187 Malignant neoplasm of sigmoid colon: Secondary | ICD-10-CM

## 2016-09-10 DIAGNOSIS — Z85118 Personal history of other malignant neoplasm of bronchus and lung: Secondary | ICD-10-CM | POA: Diagnosis not present

## 2016-09-10 DIAGNOSIS — R062 Wheezing: Secondary | ICD-10-CM | POA: Diagnosis present

## 2016-09-10 DIAGNOSIS — J984 Other disorders of lung: Secondary | ICD-10-CM | POA: Insufficient documentation

## 2016-09-10 DIAGNOSIS — C787 Secondary malignant neoplasm of liver and intrahepatic bile duct: Secondary | ICD-10-CM | POA: Diagnosis not present

## 2016-09-10 DIAGNOSIS — Z7689 Persons encountering health services in other specified circumstances: Secondary | ICD-10-CM | POA: Diagnosis not present

## 2016-09-10 DIAGNOSIS — R911 Solitary pulmonary nodule: Secondary | ICD-10-CM | POA: Insufficient documentation

## 2016-09-10 DIAGNOSIS — Z85841 Personal history of malignant neoplasm of brain: Secondary | ICD-10-CM | POA: Diagnosis not present

## 2016-09-10 DIAGNOSIS — R0602 Shortness of breath: Secondary | ICD-10-CM | POA: Diagnosis not present

## 2016-09-10 DIAGNOSIS — Z5112 Encounter for antineoplastic immunotherapy: Secondary | ICD-10-CM | POA: Diagnosis not present

## 2016-09-10 MED ORDER — SODIUM CHLORIDE 0.9% FLUSH
10.0000 mL | INTRAVENOUS | Status: DC | PRN
  Administered 2016-09-10: 10 mL
  Filled 2016-09-10: qty 10

## 2016-09-10 MED ORDER — PEGFILGRASTIM INJECTION 6 MG/0.6ML ~~LOC~~
6.0000 mg | PREFILLED_SYRINGE | Freq: Once | SUBCUTANEOUS | Status: AC
  Administered 2016-09-10: 6 mg via SUBCUTANEOUS
  Filled 2016-09-10: qty 0.6

## 2016-09-10 MED ORDER — HEPARIN SOD (PORK) LOCK FLUSH 100 UNIT/ML IV SOLN
500.0000 [IU] | Freq: Once | INTRAVENOUS | Status: AC | PRN
  Administered 2016-09-10: 500 [IU]
  Filled 2016-09-10: qty 5

## 2016-09-21 ENCOUNTER — Other Ambulatory Visit: Payer: Self-pay | Admitting: Oncology

## 2016-09-21 NOTE — Progress Notes (Signed)
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  INTERVAL HISTORY: Patient returns to clinic today for further evaluation, laboratory work, and consideration of cycle 15 of FOLFIRI plus Avastin. He has worsening weakness and fatigue. He has a poor appetite and has had weight loss in the interim. He is noted increased nausea and diarrhea. He also has shortness of breath with a nonproductive cough. He discontinued his steroids thinking this was the cause of his diarrhea. He has no neurologic complaints. He denies any recent fevers. He denies chest pain.  He has no urinary complaints. Patient offers no  further specific complaints today.   REVIEW OF SYSTEMS:   Review of Systems  Constitutional: Positive for malaise/fatigue and weight loss. Negative for fever.  Respiratory: Positive for cough and shortness of breath. Negative for hemoptysis and wheezing.   Cardiovascular: Negative.  Negative for chest pain and leg swelling.  Gastrointestinal: Positive for diarrhea and nausea. Negative for abdominal pain, blood in stool, constipation and melena.  Genitourinary: Negative.   Musculoskeletal: Negative.   Skin: Negative.  Negative for rash.  Neurological: Positive for weakness. Negative for sensory change.  Psychiatric/Behavioral: Negative.  The patient is not nervous/anxious.     As per HPI. Otherwise, a complete review of systems is negative.  PAST MEDICAL HISTORY: Past Medical History:  Diagnosis Date  . Brain cancer ([place]) 01/2014   Chemotherapy and radiation treatment  . Colon cancer ([place]) 01/2014  . Kidney stones 1975  . Liver cancer ([place])   . Lung  cancer [facility]) 2014   Radiation therapy  . Prostate cancer [facility]) 2005   treated by Dr Eliberto Ivory    PAST SURGICAL HISTORY: Past Surgical History:  Procedure Laterality Date  . COLON SURGERY  05-03-14   Resection of Sigmoid Colon and liver biopsy  . kidney stones  1975  . PORTACATH PLACEMENT  2014  . PROSTATE SURGERY  2005   partial removal due to cancer    FAMILY HISTORY: Family History  Problem Relation Age of Onset  . Lung cancer Father   . Alzheimer's disease Mother     ADVANCED DIRECTIVES (Y/N):  N  HEALTH MAINTENANCE: Social History  Substance Use Topics  . Smoking status: Former Smoker    Years: 35.00  . Smokeless tobacco: Never Used  . Alcohol use No     Colonoscopy:  PAP:  Bone density:  Lipid panel:  No Known Allergies  Current Outpatient Prescriptions  Medication Sig Dispense Refill  . albuterol (PROVENTIL HFA;VENTOLIN HFA) 108 (90 Base) MCG/ACT inhaler Inhale 2 puffs into the lungs every 4 (four) hours as needed for wheezing or shortness of breath. 1 Inhaler 2  . carvedilol (COREG) 3.125 MG tablet Take 1 tablet (3.125 mg total) by mouth 2 (two) times daily with a meal. 60 tablet 0  . potassium chloride (K-DUR) 10 MEQ tablet Take 1 tablet (10 mEq total) by mouth daily. 10 tablet 0  . dexamethasone (DECADRON) 4 MG tablet Take 1 tablet (4 mg total) by mouth daily. (Patient not taking: Reported on 09/22/2016) 30 tablet 1  . esomeprazole (NEXIUM) 40 MG capsule Take 1 capsule (40 mg total) by mouth daily at 12 noon. 30 capsule 2  .  HYDROcodone-acetaminophen (NORCO) 5-325 MG tablet Take 1 tablet by mouth every 6 (six) hours as needed for severe pain. (Patient not taking: Reported on 09/22/2016) 30 tablet 0  . senna-docusate (SENOKOT-S) 8.6-50 MG tablet Take 1 tablet by mouth at bedtime as needed for mild constipation. (Patient not taking: Reported on 09/22/2016) 20 tablet 0   No current facility-administered medications for this visit.    Facility-Administered Medications  Ordered in Other Visits  Medication Dose Route Frequency Provider Last Rate Last Dose  . sodium chloride 0.9 % injection 10 mL  10 mL Intracatheter PRN [name], MD   10 mL at 07/10/14 1428    OBJECTIVE: Vitals:   09/22/16 0854  BP: 113/84  Pulse: (!) 131     Body mass index is 16.26 kg/m.    ECOG FS:1 - Symptomatic but completely ambulatory  General: Well-developed, well-nourished, no acute distress. Eyes: Pink conjunctiva, anicteric sclera. Lungs: Scattered wheezing throughout. Heart: Regular rate and rhythm. No rubs, murmurs, or gallops. Abdomen: Soft, nontender, nondistended. No organomegaly noted, normoactive bowel sounds. Musculoskeletal: No edema, cyanosis, or clubbing. Neuro: Alert, answering all questions appropriately. Cranial nerves grossly intact. Skin: No rashes or petechiae noted. Psych: Normal affect.   LAB RESULTS:  Lab Results  Component Value Date   NA 138 09/22/2016   K 3.8 09/22/2016   CL 100 (L) 09/22/2016   CO2 27 09/22/2016   GLUCOSE 134 (H) 09/22/2016   BUN 17 09/22/2016   CREATININE 0.82 09/22/2016   CALCIUM 9.3 09/22/2016   PROT 7.7 09/22/2016   ALBUMIN 2.6 (L) 09/22/2016   AST 33 09/22/2016   ALT 23 09/22/2016   ALKPHOS 198 (H) 09/22/2016   BILITOT 0.3 09/22/2016   GFRNONAA >60 09/22/2016   GFRAA >60 09/22/2016    Lab Results  Component Value Date   WBC 11.8 (H) 09/22/2016   NEUTROABS 8.0 (H) 09/22/2016   HGB 10.9 (L) 09/22/2016   HCT 32.6 (L) 09/22/2016   MCV 83.5 09/22/2016   PLT 192 09/22/2016   Lab Results  Component Value Date   CEA 715.6 (H) 08/11/2016     STUDIES: Dg Chest 1 View  Result Date: 09/10/2016 CLINICAL DATA:  Shortness of breath and history of lung cancer EXAM: CHEST 1 VIEW COMPARISON:  Chest CT 08/20/2016 FINDINGS: Left chest wall Port-A-Cath tip is in the lower SVC. There is a loculated pleural fluid collection at the right lung apex. There is atelectasis/ scarring of the right upper lobe. Otherwise,  there is no focal airspace consolidation. No pulmonary edema. There is a nodule in the left mid lung by corresponds to a perifissural nodules seen on the prior chest CT. No pleural effusion on the left. IMPRESSION: 1. Loculated pleural fluid at the right lung apex with associated scarring/atelectasis. 2. Nodule in the left mid lung, unchanged compared to recent chest CT. Electronically Signed   By: Ulyses Jarred M.D.   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 He received carboplatinum and etoposide along with XRT completing in approximately January 2015. Patient had a recurrence in his liver in September 2015 and was reinitiated on treatment with carboplatinum and etoposide again. He was then diagnosed with adenocarcinoma of the sigmoid colon with biopsy-proven metastasis to the liver in March 2016. K-ras wild-type. Patient initiated FOLFOX and Vectibix in April 2016. Patient was found to have a complete response, therefore was switched to Vectibix maintenance therapy  in February 2017. Patient was then found to have progressive disease on a PET scan in May 2017 and was subsequently switched to [facility]. PET scan on December 08, 2015 revealed progressive disease and patient received 12 cycles of FOLFIRI between December 15, 2015 and May 26, 2016.   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  PLAN:  1. 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 Restaging CT scan from August 20, 2016 revealed progressive disease therefore patient was reinitiated on FOLFIRI plus Avastin. Delay cycle 15 secondary to declining performance status and weight loss. Patient will return to clinic in 1 week for further evaluation and reconsideration of treatment. Will consider repeat imaging after cycle 18.  2.  Anemia: Hemoglobin stable. Patient does not require blood transfusion at this time.  3. Thrombocytopenia: Resolved. 4. History of stage IV small cell lung cancer: No obvious evidence of recurrence. Will continue to monitor with routine imaging. 5. Neutropenia: Currently within normal limits. Patient will require Neulasta with chemotherapy.  6. Mouth pain: Resolved. 7. Diarrhea: Resolved. Continue OTC lomotil as needed. 8. Poor appetite: Patient has been instructed to reinitiate dexamethasone 4 mg daily. 9. Shortness of breath: Continue Ventolin inhaler BID. Continue steroids. Chest X-ray from September 10, 2016 revealed a loculated pleural fluid at the right apex.    10. Weakness and fatigue: Reinitiate dexamethasone as above and patient will receive IV fluids today.  Patient expressed understanding and was in agreement with this plan. He also understands that He can call clinic at any time with any questions, concerns, or complaints.   Cancer Staging Carcinoma of sigmoid colon [facility]) Staging form: Colon and Rectum, AJCC 7th Edition - Clinical stage from 02/27/2016: Stage IVB (yT3, N1a, M1b) - Signed by Lloyd Huger, MD on 02/27/2016   Lloyd Huger, MD   09/25/2016 6:41 AM

## 2016-09-22 ENCOUNTER — Inpatient Hospital Stay: Payer: Medicare Other | Attending: Oncology

## 2016-09-22 ENCOUNTER — Inpatient Hospital Stay (HOSPITAL_BASED_OUTPATIENT_CLINIC_OR_DEPARTMENT_OTHER): Payer: Medicare Other | Admitting: Oncology

## 2016-09-22 ENCOUNTER — Encounter: Payer: Self-pay | Admitting: Nurse Practitioner

## 2016-09-22 ENCOUNTER — Inpatient Hospital Stay: Payer: Medicare Other

## 2016-09-22 ENCOUNTER — Encounter: Payer: Self-pay | Admitting: Oncology

## 2016-09-22 VITALS — BP 113/84 | HR 131 | Wt 130.1 lb

## 2016-09-22 VITALS — HR 114

## 2016-09-22 DIAGNOSIS — Z85841 Personal history of malignant neoplasm of brain: Secondary | ICD-10-CM

## 2016-09-22 DIAGNOSIS — Z85118 Personal history of other malignant neoplasm of bronchus and lung: Secondary | ICD-10-CM | POA: Insufficient documentation

## 2016-09-22 DIAGNOSIS — R05 Cough: Secondary | ICD-10-CM | POA: Insufficient documentation

## 2016-09-22 DIAGNOSIS — C187 Malignant neoplasm of sigmoid colon: Secondary | ICD-10-CM | POA: Insufficient documentation

## 2016-09-22 DIAGNOSIS — Z79899 Other long term (current) drug therapy: Secondary | ICD-10-CM

## 2016-09-22 DIAGNOSIS — Z8546 Personal history of malignant neoplasm of prostate: Secondary | ICD-10-CM

## 2016-09-22 DIAGNOSIS — R0602 Shortness of breath: Secondary | ICD-10-CM | POA: Insufficient documentation

## 2016-09-22 DIAGNOSIS — R197 Diarrhea, unspecified: Secondary | ICD-10-CM | POA: Insufficient documentation

## 2016-09-22 DIAGNOSIS — Z5112 Encounter for antineoplastic immunotherapy: Secondary | ICD-10-CM | POA: Insufficient documentation

## 2016-09-22 DIAGNOSIS — R5381 Other malaise: Secondary | ICD-10-CM

## 2016-09-22 DIAGNOSIS — D649 Anemia, unspecified: Secondary | ICD-10-CM | POA: Insufficient documentation

## 2016-09-22 DIAGNOSIS — Z87442 Personal history of urinary calculi: Secondary | ICD-10-CM | POA: Diagnosis not present

## 2016-09-22 DIAGNOSIS — C787 Secondary malignant neoplasm of liver and intrahepatic bile duct: Secondary | ICD-10-CM | POA: Diagnosis not present

## 2016-09-22 DIAGNOSIS — R634 Abnormal weight loss: Secondary | ICD-10-CM

## 2016-09-22 DIAGNOSIS — Z801 Family history of malignant neoplasm of trachea, bronchus and lung: Secondary | ICD-10-CM | POA: Insufficient documentation

## 2016-09-22 DIAGNOSIS — R5383 Other fatigue: Secondary | ICD-10-CM | POA: Diagnosis not present

## 2016-09-22 DIAGNOSIS — Z87891 Personal history of nicotine dependence: Secondary | ICD-10-CM | POA: Diagnosis not present

## 2016-09-22 DIAGNOSIS — Z9221 Personal history of antineoplastic chemotherapy: Secondary | ICD-10-CM | POA: Diagnosis not present

## 2016-09-22 DIAGNOSIS — Z923 Personal history of irradiation: Secondary | ICD-10-CM

## 2016-09-22 DIAGNOSIS — R630 Anorexia: Secondary | ICD-10-CM

## 2016-09-22 DIAGNOSIS — Z5111 Encounter for antineoplastic chemotherapy: Secondary | ICD-10-CM | POA: Diagnosis not present

## 2016-09-22 DIAGNOSIS — R531 Weakness: Secondary | ICD-10-CM

## 2016-09-22 DIAGNOSIS — C801 Malignant (primary) neoplasm, unspecified: Secondary | ICD-10-CM

## 2016-09-22 DIAGNOSIS — Z7689 Persons encountering health services in other specified circumstances: Secondary | ICD-10-CM | POA: Diagnosis not present

## 2016-09-22 LAB — CBC WITH DIFFERENTIAL/PLATELET
BASOS ABS: 0.1 10*3/uL (ref 0–0.1)
BASOS PCT: 1 %
Eosinophils Absolute: 0 10*3/uL (ref 0–0.7)
Eosinophils Relative: 0 %
HEMATOCRIT: 32.6 % — AB (ref 40.0–52.0)
Hemoglobin: 10.9 g/dL — ABNORMAL LOW (ref 13.0–18.0)
Lymphocytes Relative: 22 %
Lymphs Abs: 2.6 10*3/uL (ref 1.0–3.6)
MCH: 27.9 pg (ref 26.0–34.0)
MCHC: 33.4 g/dL (ref 32.0–36.0)
MCV: 83.5 fL (ref 80.0–100.0)
MONO ABS: 1.2 10*3/uL — AB (ref 0.2–1.0)
Monocytes Relative: 10 %
NEUTROS ABS: 8 10*3/uL — AB (ref 1.4–6.5)
NEUTROS PCT: 67 %
Platelets: 192 10*3/uL (ref 150–440)
RBC: 3.91 MIL/uL — AB (ref 4.40–5.90)
RDW: 17.9 % — AB (ref 11.5–14.5)
WBC: 11.8 10*3/uL — AB (ref 3.8–10.6)

## 2016-09-22 LAB — COMPREHENSIVE METABOLIC PANEL
ALBUMIN: 2.6 g/dL — AB (ref 3.5–5.0)
ALT: 23 U/L (ref 17–63)
AST: 33 U/L (ref 15–41)
Alkaline Phosphatase: 198 U/L — ABNORMAL HIGH (ref 38–126)
Anion gap: 11 (ref 5–15)
BILIRUBIN TOTAL: 0.3 mg/dL (ref 0.3–1.2)
BUN: 17 mg/dL (ref 6–20)
CHLORIDE: 100 mmol/L — AB (ref 101–111)
CO2: 27 mmol/L (ref 22–32)
Calcium: 9.3 mg/dL (ref 8.9–10.3)
Creatinine, Ser: 0.82 mg/dL (ref 0.61–1.24)
GFR calc Af Amer: >60 mL/min (ref >60)
GFR calc non Af Amer: >60 mL/min (ref >60)
GLUCOSE: 134 mg/dL — AB (ref 65–99)
POTASSIUM: 3.8 mmol/L (ref 3.5–5.1)
Sodium: 138 mmol/L (ref 135–145)
Total Protein: 7.7 g/dL (ref 6.5–8.1)

## 2016-09-22 MED ORDER — SODIUM CHLORIDE 0.9% FLUSH
10.0000 mL | INTRAVENOUS | Status: DC | PRN
  Administered 2016-09-22: 10 mL via INTRAVENOUS
  Filled 2016-09-22: qty 10

## 2016-09-22 MED ORDER — SODIUM CHLORIDE 0.9 % IV SOLN
Freq: Once | INTRAVENOUS | Status: AC
  Administered 2016-09-22: 10:00:00 via INTRAVENOUS
  Filled 2016-09-22: qty 1000

## 2016-09-22 MED ORDER — HEPARIN SOD (PORK) LOCK FLUSH 100 UNIT/ML IV SOLN
500.0000 [IU] | Freq: Once | INTRAVENOUS | Status: AC
  Administered 2016-09-22: 500 [IU] via INTRAVENOUS
  Filled 2016-09-22: qty 5

## 2016-09-22 MED ORDER — SODIUM CHLORIDE 0.9 % IV SOLN
Freq: Once | INTRAVENOUS | Status: AC
  Administered 2016-09-22: 10:00:00 via INTRAVENOUS
  Filled 2016-09-22: qty 4

## 2016-09-22 MED ORDER — ESOMEPRAZOLE MAGNESIUM 40 MG PO CPDR: 40.0000 mg | DELAYED_RELEASE_CAPSULE | Freq: Every day | ORAL | 2 refills | Status: DC

## 2016-09-23 LAB — CEA: CEA1: 429.9 ng/mL — AB (ref 0.0–4.7)

## 2016-09-24 ENCOUNTER — Inpatient Hospital Stay: Payer: Medicare Other

## 2016-09-27 NOTE — Progress Notes (Signed)
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  INTERVAL HISTORY: Patient returns to clinic today for further evaluation, laboratory work, and reconsideration of cycle 15 of FOLFIRI plus Avastin. He continues to be weak and fatigued, but this has improved over the past week. His appetite and weight loss have also improved since he started taking Decadron regularly. He does not complain of any further nausea or diarrhea. He has no neurologic complaints. He denies any recent fevers. He denies chest pain or shortness of breath.  He has no urinary complaints. Patient offers no further specific complaints today.   REVIEW OF SYSTEMS:   Review of Systems  Constitutional: Positive for malaise/fatigue. Negative for fever and weight loss.  Respiratory: Positive for cough. Negative for hemoptysis, shortness of breath and wheezing.   Cardiovascular: Negative.  Negative for chest pain and leg swelling.  Gastrointestinal: Negative.  Negative for abdominal pain, blood in stool, constipation, diarrhea, melena and nausea.  Genitourinary: Negative.   Musculoskeletal: Negative.   Skin: Negative.  Negative for rash.  Neurological: Positive for weakness. Negative for sensory change.  Psychiatric/Behavioral: Negative.  The patient is not nervous/anxious.     As per HPI. Otherwise, a complete review of systems is negative.  PAST MEDICAL HISTORY: Past Medical History:  Diagnosis Date  . Brain cancer ([place]) 01/2014   Chemotherapy and radiation treatment  . Colon cancer ([place]) 01/2014  . Kidney stones 1975  . Liver cancer ([place])   . Lung cancer [facility]) 2014   Radiation  therapy  . Prostate cancer [facility]) 2005   treated by Dr Eliberto Ivory    PAST SURGICAL HISTORY: Past Surgical History:  Procedure Laterality Date  . COLON SURGERY  05-03-14   Resection of Sigmoid Colon and liver biopsy  . kidney stones  1975  . PORTACATH PLACEMENT  2014  . PROSTATE SURGERY  2005   partial removal due to cancer    FAMILY HISTORY: Family History  Problem Relation Age of Onset  . Lung cancer Father   . Alzheimer's disease Mother     ADVANCED DIRECTIVES (Y/N):  N  HEALTH MAINTENANCE: Social History  Substance Use Topics  . Smoking status: Former Smoker    Years: 35.00  . Smokeless tobacco: Never Used  . Alcohol use No     Colonoscopy:  PAP:  Bone density:  Lipid panel:  No Known Allergies  Current Outpatient Prescriptions  Medication Sig Dispense Refill  . albuterol (PROVENTIL HFA;VENTOLIN HFA) 108 (90 Base) MCG/ACT inhaler Inhale 2 puffs into the lungs every 4 (four) hours as needed for wheezing or shortness of breath. 1 Inhaler 2  . dexamethasone (DECADRON) 4 MG tablet Take 1 tablet (4 mg total) by mouth daily. 30 tablet 1  . esomeprazole (NEXIUM) 40 MG capsule Take 1 capsule (40 mg total) by mouth daily at 12 noon. 30 capsule 2  . carvedilol (COREG) 3.125 MG tablet Take 1 tablet (3.125 mg total) by mouth 2 (two) times daily with a meal. (Patient not taking: Reported on 09/29/2016) 60 tablet 0  . Fluticasone-Salmeterol (ADVAIR DISKUS) 100-50 MCG/DOSE AEPB Inhale 1 puff into the lungs 2 (two) times daily. 60 each 2  . HYDROcodone-acetaminophen (NORCO) 5-325 MG tablet Take 1  tablet by mouth every 6 (six) hours as needed for severe pain. (Patient not taking: Reported on 09/22/2016) 30 tablet 0  . potassium chloride (K-DUR) 10 MEQ tablet Take 1 tablet (10 mEq total) by mouth daily. (Patient not taking: Reported on 09/29/2016) 10 tablet 0  . senna-docusate (SENOKOT-S) 8.6-50 MG tablet Take 1 tablet by mouth at bedtime as needed for mild constipation. (Patient not taking:  Reported on 09/22/2016) 20 tablet 0   No current facility-administered medications for this visit.    Facility-Administered Medications Ordered in Other Visits  Medication Dose Route Frequency Provider Last Rate Last Dose  . sodium chloride 0.9 % injection 10 mL  10 mL Intracatheter PRN [name], MD   10 mL at 07/10/14 1428    OBJECTIVE: Vitals:   09/29/16 0924  BP: 105/79  Pulse: (!) 135  Resp: 20  Temp: (!) 95.7 F (35.4 C)     Body mass index is 16.57 kg/m.    ECOG FS:2 - Symptomatic, <50% confined to bed  General: Well-developed, well-nourished, no acute distress. Eyes: Pink conjunctiva, anicteric sclera. Lungs: Scattered wheezing throughout. Heart: Regular rate and rhythm. No rubs, murmurs, or gallops. Abdomen: Soft, nontender, nondistended. No organomegaly noted, normoactive bowel sounds. Musculoskeletal: No edema, cyanosis, or clubbing. Neuro: Alert, answering all questions appropriately. Cranial nerves grossly intact. Skin: No rashes or petechiae noted. Psych: Normal affect.   LAB RESULTS:  Lab Results  Component Value Date   NA 139 09/29/2016   K 3.8 09/29/2016   CL 101 09/29/2016   CO2 26 09/29/2016   GLUCOSE 100 (H) 09/29/2016   BUN 19 09/29/2016   CREATININE 0.65 09/29/2016   CALCIUM 9.0 09/29/2016   PROT 7.5 09/29/2016   ALBUMIN 2.3 (L) 09/29/2016   AST 58 (H) 09/29/2016   ALT 53 09/29/2016   ALKPHOS 320 (H) 09/29/2016   BILITOT 0.5 09/29/2016   GFRNONAA >60 09/29/2016   GFRAA >60 09/29/2016    Lab Results  Component Value Date   WBC 18.0 (H) 09/29/2016   NEUTROABS 14.0 (H) 09/29/2016   HGB 10.3 (L) 09/29/2016   HCT 30.7 (L) 09/29/2016   MCV 82.5 09/29/2016   PLT 214 09/29/2016   Lab Results  Component Value Date   CEA 715.6 (H) 08/11/2016     STUDIES: Dg Chest 1 View  Result Date: 09/10/2016 CLINICAL DATA:  Shortness of breath and history of lung cancer EXAM: CHEST 1 VIEW COMPARISON:  Chest CT 08/20/2016 FINDINGS: Left chest wall  Port-A-Cath tip is in the lower SVC. There is a loculated pleural fluid collection at the right lung apex. There is atelectasis/ scarring of the right upper lobe. Otherwise, there is no focal airspace consolidation. No pulmonary edema. There is a nodule in the left mid lung by corresponds to a perifissural nodules seen on the prior chest CT. No pleural effusion on the left. IMPRESSION: 1. Loculated pleural fluid at the right lung apex with associated scarring/atelectasis. 2. Nodule in the left mid lung, unchanged compared to recent chest CT. Electronically Signed   By: Ulyses Jarred M.D.   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 He received carboplatinum and etoposide along with XRT completing in approximately January 2015. Patient had a recurrence in his liver in September 2015 and was reinitiated on treatment with carboplatinum and etoposide again. He was then diagnosed with adenocarcinoma of the sigmoid colon with biopsy-proven metastasis to  the liver in March 2016. K-ras wild-type. Patient initiated FOLFOX and Vectibix in April 2016. Patient was found to have a complete response, therefore was switched to Vectibix maintenance therapy in February 2017. Patient was then found to have progressive disease on a PET scan in May 2017 and was subsequently switched to [facility]. PET scan on December 08, 2015 revealed progressive disease and patient received 12 cycles of FOLFIRI between December 15, 2015 and May 26, 2016.   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  PLAN:  1. 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 Restaging CT scan from August 20, 2016 revealed progressive disease therefore patient was reinitiated on FOLFIRI plus Avastin. Patient's CEA is now trending down. Proceed with  cycle 15 FOLFIRI plus Avastin today. Return to clinic in 2 days for pump removal and then in 2 weeks for consideration of cycle 16.  Will consider repeat imaging after cycle 18.  2. Anemia: Hemoglobin stable. Patient does not require blood transfusion at this time.  3. Thrombocytopenia: Resolved. 4. History of stage IV small cell lung cancer: No obvious evidence of recurrence. Will continue to monitor with routine imaging. 5. Neutropenia: Currently within normal limits. Patient will require Neulasta with chemotherapy.  6. Mouth pain: Resolved. 7. Diarrhea: Resolved. Continue OTC lomotil as needed. 8. Poor appetite: Improving. Continue dexamethasone 4 mg daily. 9. Shortness of breath: Continue Ventolin inhaler BID. Continue steroids. Chest X-ray from September 10, 2016 revealed a loculated pleural fluid at the right apex.    10. Weakness and fatigue: Improving.  Patient expressed understanding and was in agreement with this plan. He also understands that He can call clinic at any time with any questions, concerns, or complaints.   Cancer Staging Carcinoma of sigmoid colon [facility]) Staging form: Colon and Rectum, AJCC 7th Edition - Clinical stage from 02/27/2016: Stage IVB (yT3, N1a, M1b) - Signed by Lloyd Huger, MD on 02/27/2016   Lloyd Huger, MD   10/01/2016 9:39 AM

## 2016-09-29 ENCOUNTER — Inpatient Hospital Stay (HOSPITAL_BASED_OUTPATIENT_CLINIC_OR_DEPARTMENT_OTHER): Payer: Medicare Other | Admitting: Oncology

## 2016-09-29 ENCOUNTER — Other Ambulatory Visit: Payer: Self-pay

## 2016-09-29 ENCOUNTER — Inpatient Hospital Stay: Payer: Medicare Other

## 2016-09-29 VITALS — BP 105/79 | HR 135 | Temp 95.7°F | Resp 20 | Wt 132.6 lb

## 2016-09-29 DIAGNOSIS — Z5111 Encounter for antineoplastic chemotherapy: Secondary | ICD-10-CM | POA: Diagnosis not present

## 2016-09-29 DIAGNOSIS — C187 Malignant neoplasm of sigmoid colon: Secondary | ICD-10-CM

## 2016-09-29 DIAGNOSIS — Z79899 Other long term (current) drug therapy: Secondary | ICD-10-CM

## 2016-09-29 DIAGNOSIS — R5381 Other malaise: Secondary | ICD-10-CM | POA: Diagnosis not present

## 2016-09-29 DIAGNOSIS — Z87442 Personal history of urinary calculi: Secondary | ICD-10-CM

## 2016-09-29 DIAGNOSIS — C787 Secondary malignant neoplasm of liver and intrahepatic bile duct: Secondary | ICD-10-CM

## 2016-09-29 DIAGNOSIS — R5383 Other fatigue: Secondary | ICD-10-CM

## 2016-09-29 DIAGNOSIS — R630 Anorexia: Secondary | ICD-10-CM

## 2016-09-29 DIAGNOSIS — R531 Weakness: Secondary | ICD-10-CM | POA: Diagnosis not present

## 2016-09-29 DIAGNOSIS — Z85841 Personal history of malignant neoplasm of brain: Secondary | ICD-10-CM | POA: Diagnosis not present

## 2016-09-29 DIAGNOSIS — Z923 Personal history of irradiation: Secondary | ICD-10-CM | POA: Diagnosis not present

## 2016-09-29 DIAGNOSIS — Z801 Family history of malignant neoplasm of trachea, bronchus and lung: Secondary | ICD-10-CM

## 2016-09-29 DIAGNOSIS — Z7689 Persons encountering health services in other specified circumstances: Secondary | ICD-10-CM | POA: Diagnosis not present

## 2016-09-29 DIAGNOSIS — Z9221 Personal history of antineoplastic chemotherapy: Secondary | ICD-10-CM | POA: Diagnosis not present

## 2016-09-29 DIAGNOSIS — R05 Cough: Secondary | ICD-10-CM | POA: Diagnosis not present

## 2016-09-29 DIAGNOSIS — Z85118 Personal history of other malignant neoplasm of bronchus and lung: Secondary | ICD-10-CM

## 2016-09-29 DIAGNOSIS — D649 Anemia, unspecified: Secondary | ICD-10-CM | POA: Diagnosis not present

## 2016-09-29 DIAGNOSIS — Z87891 Personal history of nicotine dependence: Secondary | ICD-10-CM

## 2016-09-29 DIAGNOSIS — Z5112 Encounter for antineoplastic immunotherapy: Secondary | ICD-10-CM | POA: Diagnosis not present

## 2016-09-29 DIAGNOSIS — C189 Malignant neoplasm of colon, unspecified: Secondary | ICD-10-CM

## 2016-09-29 DIAGNOSIS — Z8546 Personal history of malignant neoplasm of prostate: Secondary | ICD-10-CM

## 2016-09-29 LAB — CBC WITH DIFFERENTIAL/PLATELET
BASOS ABS: 0.1 10*3/uL (ref 0–0.1)
BASOS PCT: 1 %
EOS ABS: 0 10*3/uL (ref 0–0.7)
Eosinophils Relative: 0 %
HCT: 30.7 % — ABNORMAL LOW (ref 40.0–52.0)
HEMOGLOBIN: 10.3 g/dL — AB (ref 13.0–18.0)
LYMPHS ABS: 2.6 10*3/uL (ref 1.0–3.6)
Lymphocytes Relative: 15 %
MCH: 27.6 pg (ref 26.0–34.0)
MCHC: 33.4 g/dL (ref 32.0–36.0)
MCV: 82.5 fL (ref 80.0–100.0)
Monocytes Absolute: 1.3 10*3/uL — ABNORMAL HIGH (ref 0.2–1.0)
Monocytes Relative: 7 %
NEUTROS PCT: 77 %
Neutro Abs: 14 10*3/uL — ABNORMAL HIGH (ref 1.4–6.5)
Platelets: 214 10*3/uL (ref 150–440)
RBC: 3.72 MIL/uL — AB (ref 4.40–5.90)
RDW: 18.6 % — ABNORMAL HIGH (ref 11.5–14.5)
WBC: 18 10*3/uL — AB (ref 3.8–10.6)

## 2016-09-29 LAB — COMPREHENSIVE METABOLIC PANEL
ALBUMIN: 2.3 g/dL — AB (ref 3.5–5.0)
ALK PHOS: 320 U/L — AB (ref 38–126)
ALT: 53 U/L (ref 17–63)
AST: 58 U/L — AB (ref 15–41)
Anion gap: 12 (ref 5–15)
BUN: 19 mg/dL (ref 6–20)
CALCIUM: 9 mg/dL (ref 8.9–10.3)
CO2: 26 mmol/L (ref 22–32)
CREATININE: 0.65 mg/dL (ref 0.61–1.24)
Chloride: 101 mmol/L (ref 101–111)
GFR calc Af Amer: >60 mL/min (ref >60)
GFR calc non Af Amer: >60 mL/min (ref >60)
GLUCOSE: 100 mg/dL — AB (ref 65–99)
Potassium: 3.8 mmol/L (ref 3.5–5.1)
SODIUM: 139 mmol/L (ref 135–145)
Total Bilirubin: 0.5 mg/dL (ref 0.3–1.2)
Total Protein: 7.5 g/dL (ref 6.5–8.1)

## 2016-09-29 MED ORDER — ATROPINE SULFATE 1 MG/ML IJ SOLN
0.5000 mg | Freq: Once | INTRAMUSCULAR | Status: AC | PRN
  Administered 2016-09-29: 0.5 mg via INTRAVENOUS
  Filled 2016-09-29: qty 1

## 2016-09-29 MED ORDER — SODIUM CHLORIDE 0.9 % IV SOLN
2400.0000 mg/m2 | INTRAVENOUS | Status: DC
  Administered 2016-09-29: 4450 mg via INTRAVENOUS
  Filled 2016-09-29: qty 89

## 2016-09-29 MED ORDER — PALONOSETRON HCL INJECTION 0.25 MG/5ML
0.2500 mg | Freq: Once | INTRAVENOUS | Status: AC
  Administered 2016-09-29: 0.25 mg via INTRAVENOUS
  Filled 2016-09-29: qty 5

## 2016-09-29 MED ORDER — FLUTICASONE-SALMETEROL 100-50 MCG/DOSE IN AEPB: 1.0000 | INHALATION_SPRAY | Freq: Two times a day (BID) | RESPIRATORY_TRACT | 2 refills | Status: AC

## 2016-09-29 MED ORDER — HEPARIN SOD (PORK) LOCK FLUSH 100 UNIT/ML IV SOLN
500.0000 [IU] | Freq: Once | INTRAVENOUS | Status: DC
  Filled 2016-09-29: qty 5

## 2016-09-29 MED ORDER — SODIUM CHLORIDE 0.9 % IV SOLN
300.0000 mg | Freq: Once | INTRAVENOUS | Status: AC
  Administered 2016-09-29: 300 mg via INTRAVENOUS
  Filled 2016-09-29: qty 12

## 2016-09-29 MED ORDER — SODIUM CHLORIDE 0.9 % IV SOLN
Freq: Once | INTRAVENOUS | Status: AC
  Administered 2016-09-29: 10:00:00 via INTRAVENOUS
  Filled 2016-09-29: qty 1000

## 2016-09-29 MED ORDER — DEXTROSE 5 % IV SOLN
750.0000 mg | Freq: Once | INTRAVENOUS | Status: AC
  Administered 2016-09-29: 750 mg via INTRAVENOUS
  Filled 2016-09-29: qty 17.5

## 2016-09-29 MED ORDER — SODIUM CHLORIDE 0.9% FLUSH
10.0000 mL | INTRAVENOUS | Status: DC | PRN
  Administered 2016-09-29: 10 mL via INTRAVENOUS
  Filled 2016-09-29: qty 10

## 2016-09-29 MED ORDER — IRINOTECAN HCL CHEMO INJECTION 100 MG/5ML
180.0000 mg/m2 | Freq: Once | INTRAVENOUS | Status: AC
  Administered 2016-09-29: 340 mg via INTRAVENOUS
  Filled 2016-09-29: qty 15

## 2016-09-29 MED ORDER — DEXAMETHASONE SODIUM PHOSPHATE 10 MG/ML IJ SOLN
10.0000 mg | Freq: Once | INTRAMUSCULAR | Status: AC
  Administered 2016-09-29: 10 mg via INTRAVENOUS
  Filled 2016-09-29: qty 1

## 2016-09-29 MED ORDER — FLUOROURACIL CHEMO INJECTION 2.5 GM/50ML
400.0000 mg/m2 | Freq: Once | INTRAVENOUS | Status: AC
  Administered 2016-09-29: 750 mg via INTRAVENOUS
  Filled 2016-09-29: qty 15

## 2016-10-01 ENCOUNTER — Inpatient Hospital Stay: Payer: Medicare Other

## 2016-10-01 DIAGNOSIS — Z85118 Personal history of other malignant neoplasm of bronchus and lung: Secondary | ICD-10-CM | POA: Diagnosis not present

## 2016-10-01 DIAGNOSIS — Z7689 Persons encountering health services in other specified circumstances: Secondary | ICD-10-CM | POA: Diagnosis not present

## 2016-10-01 DIAGNOSIS — C787 Secondary malignant neoplasm of liver and intrahepatic bile duct: Secondary | ICD-10-CM | POA: Diagnosis not present

## 2016-10-01 DIAGNOSIS — C187 Malignant neoplasm of sigmoid colon: Secondary | ICD-10-CM | POA: Diagnosis not present

## 2016-10-01 DIAGNOSIS — Z5111 Encounter for antineoplastic chemotherapy: Secondary | ICD-10-CM | POA: Diagnosis not present

## 2016-10-01 DIAGNOSIS — Z5112 Encounter for antineoplastic immunotherapy: Secondary | ICD-10-CM | POA: Diagnosis not present

## 2016-10-01 MED ORDER — PEGFILGRASTIM INJECTION 6 MG/0.6ML ~~LOC~~
6.0000 mg | PREFILLED_SYRINGE | Freq: Once | SUBCUTANEOUS | Status: AC
  Administered 2016-10-01: 6 mg via SUBCUTANEOUS
  Filled 2016-10-01: qty 0.6

## 2016-10-01 MED ORDER — SODIUM CHLORIDE 0.9% FLUSH
10.0000 mL | INTRAVENOUS | Status: DC | PRN
  Administered 2016-10-01: 10 mL
  Filled 2016-10-01: qty 10

## 2016-10-01 MED ORDER — HEPARIN SOD (PORK) LOCK FLUSH 100 UNIT/ML IV SOLN
500.0000 [IU] | Freq: Once | INTRAVENOUS | Status: AC | PRN
  Administered 2016-10-01: 500 [IU]
  Filled 2016-10-01: qty 5

## 2016-10-05 ENCOUNTER — Emergency Department: Payer: Medicare Other

## 2016-10-05 ENCOUNTER — Encounter: Payer: Self-pay | Admitting: Emergency Medicine

## 2016-10-05 ENCOUNTER — Other Ambulatory Visit: Payer: Self-pay

## 2016-10-05 ENCOUNTER — Inpatient Hospital Stay
Admission: EM | Admit: 2016-10-05 | Discharge: 2016-10-07 | DRG: 871 | Disposition: A | Discharge status: Hospice | Payer: Medicare Other | Attending: Internal Medicine | Admitting: Internal Medicine

## 2016-10-05 DIAGNOSIS — C787 Secondary malignant neoplasm of liver and intrahepatic bile duct: Secondary | ICD-10-CM | POA: Diagnosis present

## 2016-10-05 DIAGNOSIS — Z66 Do not resuscitate: Secondary | ICD-10-CM | POA: Diagnosis not present

## 2016-10-05 DIAGNOSIS — Z515 Encounter for palliative care: Secondary | ICD-10-CM

## 2016-10-05 DIAGNOSIS — R197 Diarrhea, unspecified: Secondary | ICD-10-CM | POA: Diagnosis present

## 2016-10-05 DIAGNOSIS — R5381 Other malaise: Secondary | ICD-10-CM | POA: Diagnosis not present

## 2016-10-05 DIAGNOSIS — Z8546 Personal history of malignant neoplasm of prostate: Secondary | ICD-10-CM

## 2016-10-05 DIAGNOSIS — E43 Unspecified severe protein-calorie malnutrition: Secondary | ICD-10-CM | POA: Diagnosis present

## 2016-10-05 DIAGNOSIS — T451X5A Adverse effect of antineoplastic and immunosuppressive drugs, initial encounter: Secondary | ICD-10-CM | POA: Diagnosis present

## 2016-10-05 DIAGNOSIS — C189 Malignant neoplasm of colon, unspecified: Secondary | ICD-10-CM | POA: Diagnosis present

## 2016-10-05 DIAGNOSIS — Z79899 Other long term (current) drug therapy: Secondary | ICD-10-CM

## 2016-10-05 DIAGNOSIS — D61818 Other pancytopenia: Secondary | ICD-10-CM | POA: Diagnosis present

## 2016-10-05 DIAGNOSIS — R0603 Acute respiratory distress: Secondary | ICD-10-CM

## 2016-10-05 DIAGNOSIS — R5383 Other fatigue: Secondary | ICD-10-CM | POA: Diagnosis not present

## 2016-10-05 DIAGNOSIS — J189 Pneumonia, unspecified organism: Secondary | ICD-10-CM | POA: Diagnosis present

## 2016-10-05 DIAGNOSIS — Y95 Nosocomial condition: Secondary | ICD-10-CM | POA: Diagnosis present

## 2016-10-05 DIAGNOSIS — R630 Anorexia: Secondary | ICD-10-CM | POA: Diagnosis not present

## 2016-10-05 DIAGNOSIS — R531 Weakness: Secondary | ICD-10-CM | POA: Diagnosis not present

## 2016-10-05 DIAGNOSIS — Z85118 Personal history of other malignant neoplasm of bronchus and lung: Secondary | ICD-10-CM | POA: Diagnosis not present

## 2016-10-05 DIAGNOSIS — Z85841 Personal history of malignant neoplasm of brain: Secondary | ICD-10-CM

## 2016-10-05 DIAGNOSIS — R079 Chest pain, unspecified: Secondary | ICD-10-CM | POA: Diagnosis not present

## 2016-10-05 DIAGNOSIS — A419 Sepsis, unspecified organism: Principal | ICD-10-CM | POA: Diagnosis present

## 2016-10-05 DIAGNOSIS — Z681 Body mass index (BMI) 19 or less, adult: Secondary | ICD-10-CM

## 2016-10-05 DIAGNOSIS — R000 Tachycardia, unspecified: Secondary | ICD-10-CM

## 2016-10-05 DIAGNOSIS — Z923 Personal history of irradiation: Secondary | ICD-10-CM | POA: Diagnosis not present

## 2016-10-05 DIAGNOSIS — E872 Acidosis, unspecified: Secondary | ICD-10-CM

## 2016-10-05 DIAGNOSIS — C801 Malignant (primary) neoplasm, unspecified: Secondary | ICD-10-CM | POA: Diagnosis not present

## 2016-10-05 DIAGNOSIS — R05 Cough: Secondary | ICD-10-CM | POA: Diagnosis not present

## 2016-10-05 DIAGNOSIS — Z9221 Personal history of antineoplastic chemotherapy: Secondary | ICD-10-CM | POA: Diagnosis not present

## 2016-10-05 DIAGNOSIS — R0600 Dyspnea, unspecified: Secondary | ICD-10-CM | POA: Diagnosis not present

## 2016-10-05 DIAGNOSIS — J9601 Acute respiratory failure with hypoxia: Secondary | ICD-10-CM | POA: Diagnosis present

## 2016-10-05 DIAGNOSIS — R6520 Severe sepsis without septic shock: Secondary | ICD-10-CM | POA: Diagnosis present

## 2016-10-05 DIAGNOSIS — Z7189 Other specified counseling: Secondary | ICD-10-CM

## 2016-10-05 DIAGNOSIS — B370 Candidal stomatitis: Secondary | ICD-10-CM | POA: Diagnosis present

## 2016-10-05 DIAGNOSIS — X58XXXA Exposure to other specified factors, initial encounter: Secondary | ICD-10-CM | POA: Diagnosis present

## 2016-10-05 DIAGNOSIS — D6959 Other secondary thrombocytopenia: Secondary | ICD-10-CM | POA: Diagnosis present

## 2016-10-05 DIAGNOSIS — C799 Secondary malignant neoplasm of unspecified site: Secondary | ICD-10-CM | POA: Diagnosis not present

## 2016-10-05 DIAGNOSIS — C775 Secondary and unspecified malignant neoplasm of intrapelvic lymph nodes: Secondary | ICD-10-CM | POA: Diagnosis present

## 2016-10-05 DIAGNOSIS — B3781 Candidal esophagitis: Secondary | ICD-10-CM | POA: Diagnosis present

## 2016-10-05 DIAGNOSIS — R0602 Shortness of breath: Secondary | ICD-10-CM | POA: Diagnosis not present

## 2016-10-05 DIAGNOSIS — R634 Abnormal weight loss: Secondary | ICD-10-CM | POA: Diagnosis not present

## 2016-10-05 DIAGNOSIS — Z87891 Personal history of nicotine dependence: Secondary | ICD-10-CM

## 2016-10-05 DIAGNOSIS — Z7401 Bed confinement status: Secondary | ICD-10-CM | POA: Diagnosis not present

## 2016-10-05 DIAGNOSIS — C187 Malignant neoplasm of sigmoid colon: Secondary | ICD-10-CM | POA: Diagnosis not present

## 2016-10-05 DIAGNOSIS — D649 Anemia, unspecified: Secondary | ICD-10-CM | POA: Diagnosis not present

## 2016-10-05 DIAGNOSIS — J984 Other disorders of lung: Secondary | ICD-10-CM | POA: Diagnosis not present

## 2016-10-05 DIAGNOSIS — D696 Thrombocytopenia, unspecified: Secondary | ICD-10-CM | POA: Diagnosis not present

## 2016-10-05 DIAGNOSIS — R1310 Dysphagia, unspecified: Secondary | ICD-10-CM | POA: Diagnosis not present

## 2016-10-05 DIAGNOSIS — C7931 Secondary malignant neoplasm of brain: Secondary | ICD-10-CM | POA: Diagnosis not present

## 2016-10-05 DIAGNOSIS — R627 Adult failure to thrive: Secondary | ICD-10-CM | POA: Diagnosis not present

## 2016-10-05 LAB — TROPONIN I: TROPONIN I: 0.03 ng/mL — AB (ref <0.03)

## 2016-10-05 LAB — CBC WITH DIFFERENTIAL/PLATELET
BAND NEUTROPHILS: 8 %
BASOS ABS: 0 10*3/uL (ref 0–0.1)
BLASTS: 0 %
Basophils Relative: 0 %
EOS ABS: 0 10*3/uL (ref 0–0.7)
Eosinophils Relative: 0 %
HEMATOCRIT: 34.6 % — AB (ref 40.0–52.0)
HEMOGLOBIN: 11.2 g/dL — AB (ref 13.0–18.0)
Lymphocytes Relative: 15 %
Lymphs Abs: 1.1 10*3/uL (ref 1.0–3.6)
MCH: 27.1 pg (ref 26.0–34.0)
MCHC: 32.4 g/dL (ref 32.0–36.0)
MCV: 83.8 fL (ref 80.0–100.0)
METAMYELOCYTES PCT: 0 %
MONO ABS: 0.2 10*3/uL (ref 0.2–1.0)
Monocytes Relative: 2 %
Myelocytes: 0 %
Neutro Abs: 6.2 10*3/uL (ref 1.4–6.5)
Neutrophils Relative %: 75 %
Other: 0 %
PLATELETS: 91 10*3/uL — AB (ref 150–440)
PROMYELOCYTES ABS: 0 %
RBC: 4.13 MIL/uL — ABNORMAL LOW (ref 4.40–5.90)
RDW: 18.8 % — AB (ref 11.5–14.5)
WBC: 7.5 10*3/uL (ref 3.8–10.6)
nRBC: 0 /100 WBC

## 2016-10-05 LAB — COMPREHENSIVE METABOLIC PANEL
ALK PHOS: 240 U/L — AB (ref 38–126)
ALT: 29 U/L (ref 17–63)
AST: 44 U/L — AB (ref 15–41)
Albumin: 2.5 g/dL — ABNORMAL LOW (ref 3.5–5.0)
Anion gap: 15 (ref 5–15)
BILIRUBIN TOTAL: 1.1 mg/dL (ref 0.3–1.2)
BUN: 33 mg/dL — AB (ref 6–20)
CALCIUM: 9.1 mg/dL (ref 8.9–10.3)
CO2: 22 mmol/L (ref 22–32)
CREATININE: 0.98 mg/dL (ref 0.61–1.24)
Chloride: 101 mmol/L (ref 101–111)
GFR calc Af Amer: >60 mL/min (ref >60)
Glucose, Bld: 280 mg/dL — ABNORMAL HIGH (ref 65–99)
Potassium: 4.4 mmol/L (ref 3.5–5.1)
Sodium: 138 mmol/L (ref 135–145)
TOTAL PROTEIN: 7.5 g/dL (ref 6.5–8.1)

## 2016-10-05 LAB — LACTIC ACID, PLASMA
LACTIC ACID, VENOUS: 7.4 mmol/L — AB (ref 0.5–1.9)
Lactic Acid, Venous: 3.3 mmol/L (ref 0.5–1.9)

## 2016-10-05 MED ORDER — ONDANSETRON HCL 4 MG PO TABS: 4.0000 mg | ORAL_TABLET | Freq: Four times a day (QID) | ORAL | Status: DC | PRN

## 2016-10-05 MED ORDER — IOPAMIDOL (ISOVUE-370) INJECTION 76%
75.0000 mL | Freq: Once | INTRAVENOUS | Status: AC | PRN
  Administered 2016-10-05: 75 mL via INTRAVENOUS

## 2016-10-05 MED ORDER — POLYETHYLENE GLYCOL 3350 17 G PO PACK: 17.0000 g | PACK | Freq: Every day | ORAL | Status: DC | PRN

## 2016-10-05 MED ORDER — AZITHROMYCIN 500 MG PO TABS
500.0000 mg | ORAL_TABLET | Freq: Every day | ORAL | Status: DC
  Administered 2016-10-06 – 2016-10-07 (×2): 500 mg via ORAL
  Filled 2016-10-05 (×2): qty 1

## 2016-10-05 MED ORDER — IPRATROPIUM-ALBUTEROL 0.5-2.5 (3) MG/3ML IN SOLN
3.0000 mL | Freq: Once | RESPIRATORY_TRACT | Status: AC
  Administered 2016-10-05: 3 mL via RESPIRATORY_TRACT
  Filled 2016-10-05: qty 3

## 2016-10-05 MED ORDER — ENOXAPARIN SODIUM 40 MG/0.4ML ~~LOC~~ SOLN
40.0000 mg | SUBCUTANEOUS | Status: DC
  Administered 2016-10-05: 23:00:00 40 mg via SUBCUTANEOUS
  Filled 2016-10-05: qty 0.4

## 2016-10-05 MED ORDER — ONDANSETRON HCL 4 MG/2ML IJ SOLN: 4.0000 mg | Freq: Four times a day (QID) | INTRAMUSCULAR | Status: DC | PRN

## 2016-10-05 MED ORDER — OXYCODONE HCL 5 MG PO TABS: 5.0000 mg | ORAL_TABLET | ORAL | Status: DC | PRN

## 2016-10-05 MED ORDER — ACETAMINOPHEN 325 MG PO TABS: 650.0000 mg | ORAL_TABLET | Freq: Four times a day (QID) | ORAL | Status: DC | PRN

## 2016-10-05 MED ORDER — SODIUM CHLORIDE 0.9 % IV SOLN
INTRAVENOUS | Status: DC
  Administered 2016-10-05: 22:00:00 via INTRAVENOUS

## 2016-10-05 MED ORDER — SODIUM CHLORIDE 0.9 % IV BOLUS (SEPSIS): 500.0000 mL | Freq: Once | INTRAVENOUS | Status: DC

## 2016-10-05 MED ORDER — ENSURE ENLIVE PO LIQD
237.0000 mL | Freq: Two times a day (BID) | ORAL | Status: DC
  Administered 2016-10-06 – 2016-10-07 (×3): 237 mL via ORAL

## 2016-10-05 MED ORDER — ACETAMINOPHEN 650 MG RE SUPP
650.0000 mg | Freq: Four times a day (QID) | RECTAL | Status: DC | PRN
Start: 2016-10-05 — End: 2016-10-07

## 2016-10-05 MED ORDER — ALBUTEROL SULFATE (2.5 MG/3ML) 0.083% IN NEBU: 2.5000 mg | INHALATION_SOLUTION | RESPIRATORY_TRACT | Status: DC | PRN

## 2016-10-05 MED ORDER — VANCOMYCIN HCL IN DEXTROSE 1-5 GM/200ML-% IV SOLN
1000.0000 mg | Freq: Once | INTRAVENOUS | Status: AC
  Administered 2016-10-05: 1000 mg via INTRAVENOUS
  Filled 2016-10-05: qty 200

## 2016-10-05 MED ORDER — SODIUM CHLORIDE 0.9 % IV BOLUS (SEPSIS)
1000.0000 mL | Freq: Once | INTRAVENOUS | Status: AC
  Administered 2016-10-05: 1000 mL via INTRAVENOUS

## 2016-10-05 MED ORDER — SODIUM CHLORIDE 0.9% FLUSH: 10.0000 mL | INTRAVENOUS | Status: DC | PRN

## 2016-10-05 MED ORDER — DEXTROSE 5 % IV SOLN
2.0000 g | Freq: Once | INTRAVENOUS | Status: AC
  Administered 2016-10-05: 2 g via INTRAVENOUS
  Filled 2016-10-05: qty 2

## 2016-10-05 MED ORDER — DEXTROSE 5 % IV SOLN
2.0000 g | Freq: Two times a day (BID) | INTRAVENOUS | Status: DC
  Administered 2016-10-06 – 2016-10-07 (×3): 2 g via INTRAVENOUS
  Filled 2016-10-05 (×4): qty 2

## 2016-10-05 MED ORDER — FLUCONAZOLE IN SODIUM CHLORIDE 100-0.9 MG/50ML-% IV SOLN
100.0000 mg | INTRAVENOUS | Status: DC
  Administered 2016-10-05 – 2016-10-06 (×2): 100 mg via INTRAVENOUS
  Filled 2016-10-05 (×4): qty 50

## 2016-10-05 MED ORDER — VANCOMYCIN HCL IN DEXTROSE 1-5 GM/200ML-% IV SOLN
1000.0000 mg | INTRAVENOUS | Status: DC
  Administered 2016-10-06 (×2): 1000 mg via INTRAVENOUS
  Filled 2016-10-05 (×3): qty 200

## 2016-10-05 MED ORDER — SODIUM CHLORIDE 0.9 % IV BOLUS (SEPSIS)
1000.0000 mL | Freq: Once | INTRAVENOUS | Status: AC
  Administered 2016-10-05: 21:00:00 1000 mL via INTRAVENOUS

## 2016-10-05 NOTE — Plan of Care (Signed)
Problem: Education: Goal: Knowledge of [place] [facility] Education information/materials will improve Outcome: Progressing  Pt likes to be called [place]   Past Medical History:  Diagnosis Date  . Brain cancer ([place]) 01/2014   Chemotherapy and radiation treatment  . Colon cancer ([place]) 01/2014  . Kidney stones 1975  . Liver cancer ([place])   . Lung cancer [facility]) 2014   Radiation therapy  . Prostate cancer [facility]) 2005   treated by Dr Eliberto Ivory   Pt is well controlled with home medications

## 2016-10-05 NOTE — Progress Notes (Signed)
Pharmacy Antibiotic Note  [name] is a 76 y.o. male admitted on 10/05/2016 with pneumonia.  Pharmacy has been consulted for cefepime and vancomycin dosing.  Plan: Cefepime 2 g IV q12h  Vancomycin 1000 mg dose given in ED.  Vancomycin 1000 mg IV q18h (7 hour stack) Goal VT 15-20 mcg/mL VT ordered 8/23 @ 1100 which is slightly prior to steady state - want to ensure patient is clearing drug. Ordered SCr with AM labs tomorrow morning. Patient did receive contrast today.  Kinetics: Using actual body weight = 60 kg, CrCl 56 mL/min Ke: 0.051 Half-life: 14 hrs Vd: 42 L Cmin (estimated) ~18 mcg/mL  MRSA PCR ordered  Height: 6\' 2"  (188 cm) Weight: 132 lb (59.9 kg) IBW/kg (Calculated) : 82.2  No data recorded.   Recent Labs Lab 09/29/16 0902 10/05/16 1613  WBC 18.0* 7.5  CREATININE 0.65 0.98  LATICACIDVEN  --  7.4*    Estimated Creatinine Clearance: 56.9 mL/min (by C-G formula based on SCr of 0.98 mg/dL).    No Known Allergies  Antimicrobials this admission: cefepime 8/21 >>  vancomycin 8/21 >>  Azithromycin 8/21 >>  Dose adjustments this admission:  Microbiology results: 8/21 BCx: Sent 8/21 UCx: Sent  8/21 MRSA PCR: Sent  Thank you for allowing pharmacy to be a part of this patient's care.  Lenis Noon, PharmD, BCPS Clinical Pharmacist 10/05/2016 6:56 PM

## 2016-10-05 NOTE — ED Triage Notes (Signed)
C/O SOB x 1 day.    ARrives dyspneic, diaphretic.  STates SOB x 1 day.  Has history of Lung CA

## 2016-10-05 NOTE — Progress Notes (Signed)
Advance care planning  Discussed with patient with wife at bedside. He has had a difficult time going through chemotherapy with stage IV colon cancer. We discussed regarding his sepsis, metastatic cancer and possible reoccurrence of the lung cancer on CT scan. He has been too weak to tolerate chemotherapy and had to miss one drowned recently. Poor appetite, 20 pound weight loss. On discussing regarding her CODE STATUS patient tells that he does not want to be on a ventilator or be resuscitated. Explained regarding DO NOT RESUSCITATE status and patient agreed. Orders entered. Advised that we will have oncology see the patient tomorrow for further prognosis.  Time spent 20 minutes

## 2016-10-05 NOTE — ED Notes (Signed)
Pt offered bedpan at this time.

## 2016-10-05 NOTE — H&P (Signed)
[facility] [place] NAME: [name]    MR#:  [id number]  DATE OF BIRTH:  1940/05/28  DATE OF ADMISSION:  10/05/2016  PRIMARY CARE PHYSICIAN: Marden Noble, MD   REQUESTING/REFERRING PHYSICIAN: Dr. Quentin Cornwall  CHIEF COMPLAINT:   Chief Complaint  Patient presents with  . Shortness of Breath    HISTORY OF PRESENT ILLNESS:  [name]  is a 76 y.o. 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 Patient at baseline ambulates with a walker but has had extreme difficulty ambulating with that. Today he got extremely short of breath and weak and presented to the emergency room. Here patient was found to be tachycardic in the 150s with lactic acid of 7. Chest x-ray showed some chronic changes. CT scan of the chest was done for a pulmonary embolism which was negative. But did show a cavitary area in the right upper lobe a he had consolidation and cancer in the past. Also groundglass opacities around this. Concern for recurrence of cancer. Afebrile. He complains of green and gray sputum.  He also complains of severe pain on eating along with odynophagia/burning in chest with swallowing  PAST MEDICAL HISTORY:   Past Medical History:  Diagnosis Date  . Brain cancer ([place]) 01/2014   Chemotherapy and radiation treatment  . Colon cancer ([place]) 01/2014  . Kidney stones 1975  . Liver cancer ([place])   . Lung cancer [facility]) 2014   Radiation therapy  . Prostate cancer [facility]) 2005   treated by Dr Eliberto Ivory    PAST SURGICAL HISTORY:   Past Surgical History:  Procedure Laterality Date  . COLON SURGERY  05-03-14   Resection of Sigmoid Colon and liver biopsy  . kidney stones  1975  . PORTACATH PLACEMENT  2014  . PROSTATE SURGERY  2005   partial removal due to cancer    SOCIAL HISTORY:    Social History  Substance Use Topics  . Smoking status: Former Smoker    Years: 35.00  . Smokeless tobacco: Never Used  . Alcohol use No    FAMILY HISTORY:   Family History  Problem Relation Age of Onset  . Lung cancer Father   . Alzheimer's disease Mother     DRUG ALLERGIES:  No Known Allergies  REVIEW OF SYSTEMS:   Review of Systems  Constitutional: Positive for malaise/fatigue. Negative for chills and fever.  HENT: Negative for sore throat.   Eyes: Negative for blurred vision, double vision and pain.  Respiratory: Positive for cough, sputum production and shortness of breath. Negative for hemoptysis and wheezing.   Cardiovascular: Negative for chest pain, palpitations, orthopnea and leg swelling.  Gastrointestinal: Negative for abdominal pain, constipation, diarrhea, heartburn, nausea and vomiting.  Genitourinary: Negative for dysuria and hematuria.  Musculoskeletal: Negative for back pain and joint pain.  Skin: Negative for rash.  Neurological: Positive for weakness. Negative for sensory change, speech change, focal weakness and headaches.  Endo/Heme/Allergies: Does not bruise/bleed easily.  Psychiatric/Behavioral: Negative for depression. The patient is not nervous/anxious.     MEDICATIONS AT HOME:   Prior to Admission medications   Medication Sig Start Date End Date Taking? Authorizing Provider  albuterol (PROVENTIL HFA;VENTOLIN HFA) 108 (90 Base) MCG/ACT inhaler Inhale 2 puffs into the lungs every 4 (four) hours as needed for wheezing or shortness of breath. 08/25/16  Lloyd Huger, MD  carvedilol (COREG) 3.125 MG tablet Take 1 tablet (3.125 mg total) by mouth 2 (two) times daily with a meal. Patient not taking: Reported on 09/29/2016 05/13/16   Vaughan Basta, MD  dexamethasone (DECADRON) 4 MG tablet Take 1 tablet (4 mg total) by mouth daily. 08/25/16   Lloyd Huger, MD  esomeprazole (NEXIUM) 40 MG capsule Take 1 capsule (40 mg total) by mouth  daily at 12 noon. 09/22/16   Lloyd Huger, MD  Fluticasone-Salmeterol (ADVAIR DISKUS) 100-50 MCG/DOSE AEPB Inhale 1 puff into the lungs 2 (two) times daily. 09/29/16   Lloyd Huger, MD  HYDROcodone-acetaminophen (NORCO) 5-325 MG tablet Take 1 tablet by mouth every 6 (six) hours as needed for severe pain. Patient not taking: Reported on 09/22/2016 04/07/16   Lloyd Huger, MD  potassium chloride (K-DUR) 10 MEQ tablet Take 1 tablet (10 mEq total) by mouth daily. Patient not taking: Reported on 09/29/2016 05/13/16   Vaughan Basta, MD  senna-docusate (SENOKOT-S) 8.6-50 MG tablet Take 1 tablet by mouth at bedtime as needed for mild constipation. Patient not taking: Reported on 09/22/2016 05/13/16   Vaughan Basta, MD     VITAL SIGNS:  Blood pressure 126/72, pulse (!) 42, resp. rate (!) 22, height 6\' 2"  (1.88 m), weight 59.9 kg (132 lb), SpO2 100 %.  PHYSICAL EXAMINATION:  Physical Exam  GENERAL:  76 y.o.-year-old patient lying in the bed EYES: Pupils equal, round, reactive to light and accommodation. No scleral icterus. Extraocular muscles intact.  HEENT: Head atraumatic, normocephalic. Oral thrush. No oropharyngeal erythema, moist oral mucosa  NECK:  Supple, no jugular venous distention. No thyroid enlargement, no tenderness. Port-A-Cath in place LUNGS: Coarse breath sounds bilaterally. Decreased air entry. CARDIOVASCULAR: S1, S2 tachycardia. No murmurs, rubs, or gallops.  ABDOMEN: Soft, nontender, nondistended. Bowel sounds present. No organomegaly or mass.  EXTREMITIES: No pedal edema, cyanosis, or clubbing. + 2 pedal & radial pulses b/l.  Diffuse muscle wasting NEUROLOGIC: Cranial nerves II through XII are intact. No focal Motor or sensory deficits appreciated b/l PSYCHIATRIC: The patient is alert and oriented x 3. Good affect.   LABORATORY PANEL:   CBC  Recent Labs Lab 10/05/16 1613  WBC 7.5  HGB 11.2*  HCT 34.6*  PLT 91*    ------------------------------------------------------------------------------------------------------------------  Chemistries   Recent Labs Lab 10/05/16 1613  NA 138  K 4.4  CL 101  CO2 22  GLUCOSE 280*  BUN 33*  CREATININE 0.98  CALCIUM 9.1  AST 44*  ALT 29  ALKPHOS 240*  BILITOT 1.1   ------------------------------------------------------------------------------------------------------------------  Cardiac Enzymes  Recent Labs Lab 10/05/16 1613  TROPONINI 0.03*   ------------------------------------------------------------------------------------------------------------------  RADIOLOGY:  Ct Angio Chest Pe W And/or Wo Contrast  Result Date: 10/05/2016 CLINICAL DATA:  Dyspnea in diaphoresis history of lung cancer. Shortness of breath. EXAM: CT ANGIOGRAPHY CHEST WITH CONTRAST TECHNIQUE: Multidetector CT imaging of the chest was performed using the standard protocol during bolus administration of intravenous contrast. Multiplanar CT image reconstructions and MIPs were obtained to evaluate the vascular anatomy. CONTRAST:  75 cc Isovue 370 intravenously. COMPARISON:  Body CT 08/20/2016 FINDINGS: Cardiovascular: Satisfactory opacification of the pulmonary arteries to the segmental level. No evidence of pulmonary embolism. Normal heart size. No pericardial effusion. Calcific atherosclerotic disease of the aorta and coronary arteries. Mediastinum/Nodes: Mild right mediastinal lymphadenopathy. Left IJ approach central venous catheter terminates at the cavoatrial junction. Lungs/Pleura: Interval development of thick-walled cavitation within the known right upper lobe mass versus airspace consolidation. The  area of cavitation measures up to 7.2 cm. Extensive patchy and ground-glass airspace consolidations in the surrounding lung parenchyma. There has been also interval development of any predominantly peribronchovascular ground-glass nodularity in the aerated portion of the right lower  lobe. Peribronchial ground-glass opacities are seen to a lesser degree throughout the left lung. Stable left upper lobe 1 cm solid pulmonary nodule. Left lower lobe subpleural pulmonary nodule, stable at 8 mm. Upper Abdomen: Known widespread hepatic metastatic disease is poorly visualized due to the arterial phase of the study. Bilateral renal cysts. Musculoskeletal: Stable heterogeneous attenuation of the posterior aspect of T10 vertebral body, nonspecific. Review of the MIP images confirms the above findings. IMPRESSION: Interval development of large thick-walled cavitation within the known right upper lobe mass/airspace consolidation, and extensive bronchial thickening and predominantly peribronchovascular ground-glass nodularity throughout the right lung and less likely throughout the left lower lobe. Findings may represent advancement of malignancy with lymphangitic spread of disease versus superimposed development of multifocal pneumonia. Stable appearance of left-sided pulmonary nodules. Stable, considering difference in technique, hepatic metastatic disease. Aortic Atherosclerosis (ICD10-I70.0). Electronically Signed   By: Fidela Salisbury M.D.   On: 10/05/2016 17:50   Dg Chest Port 1 View  Result Date: 10/05/2016 CLINICAL DATA:  Shortness of breath and chest pain. History of colon cancer. EXAM: PORTABLE CHEST 1 VIEW COMPARISON:  09/10/2016 FINDINGS: Left jugular Port-A-Cath terminates over the mid SVC, unchanged. The cardiac silhouette is normal in size. Aortic atherosclerosis is noted. There is chronic right upper lobe volume loss with consolidative opacity at the apex and right hilar retraction, not significantly changed. An 11 mm nodule in the left mid lung is unchanged. No acute airspace consolidation, sizeable pleural effusion, or pneumothorax is identified. IMPRESSION: 1. Chronic right upper lobe volume loss without evidence of acute cardiopulmonary process. 2. Unchanged left mid lung nodule.  Electronically Signed   By: Logan Bores M.D.   On: 10/05/2016 16:28     IMPRESSION AND PLAN:   * Right-sided pneumonia with severe sepsis Will start broad-spectrum antibiotics. A cavitary lesion is from an old loculated effusion he has had along with lung cancer. We'll consult pulmonary. There is concern regarding possible reoccurrence of lung cancer. Oncology to see the patient.  * Oral thrush with high suspicion for esophageal candidiasis in an immunocompromised patient. We'll start IV fluconazole. Liquid diet.  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 Has lost significant amount of weight. May need palliative care consult depending on oncology input.  * Severe protein calorie malnutrition. Consult dietitian.  * Mild thrombocytopenia likely due to chemotherapy.  * DVT prophylaxis with Lovenox. Hold if there is worsening thrombocytopenia   All the records are reviewed and case discussed with ED provider. Management plans discussed with the patient, family and they are in agreement.  CODE STATUS: DNR  TOTAL TIME TAKING CARE OF THIS PATIENT: 40 minutes.   Hillary Bow R M.D on 10/05/2016 at 6:12 PM  Between 7am to 6pm - Pager - [phone]  After 6pm go to [email or web address] - password [email or web address]  Office  [phone]  CC: Primary care physician; Marden Noble, MD  Note: This dictation was prepared with Dragon dictation along with smaller phrase technology. Any transcriptional errors that result from this process are unintentional.

## 2016-10-05 NOTE — ED Provider Notes (Signed)
[facility] Emergency Department Provider Note    First MD Initiated Contact with Patient 10/05/16 1551     (approximate)  I have reviewed the triage vital signs and the nursing notes.   HISTORY  Chief Complaint Shortness of Breath    HPI [name] is a 77 y.o. male with multiple cancer primaries presents with a chief complaint of chest pain shortness of breath, decreased appetite and fatigue. Symptoms have been worsening over the past several days. Has had chills at home but no measured fevers. Does not wear home oxygen. Patient states he's had very little appetite. No abdominal pain but does feel severely short of breath. Is not currently on any chemotherapy. Patient brought back in the ER critically ill-appearing. He is tachycardic but no hypoxia. He is speaking in short phrases. When asked he states that he had not discussed with anyone or any goals of care and is not sure whether he would want to be placed on life-support.   Past Medical History:  Diagnosis Date  . Brain cancer ([place]) 01/2014   Chemotherapy and radiation treatment  . Colon cancer ([place]) 01/2014  . Kidney stones 1975  . Liver cancer ([place])   . Lung cancer [facility]) 2014   Radiation therapy  . Prostate cancer [facility]) 2005   treated by Dr Eliberto Ivory   Family History  Problem Relation Age of Onset  . Lung cancer Father   . Alzheimer's disease Mother    Past Surgical History:  Procedure Laterality Date  . COLON SURGERY  05-03-14   Resection of Sigmoid Colon and liver biopsy  . kidney stones  1975  . PORTACATH PLACEMENT  2014  . PROSTATE SURGERY  2005   partial removal due to cancer   Patient Active Problem List   Diagnosis Date Noted  . Protein-calorie malnutrition, severe 05/11/2016  . Goals of care, counseling/discussion 04/06/2016  . Sepsis ([place]) 11/24/2015  . Metastasis to liver ([place]) 10/05/2015  . 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 Authorizing Provider  albuterol (PROVENTIL HFA;VENTOLIN HFA) 108 (90 Base) MCG/ACT inhaler Inhale 2 puffs into the lungs every 4 (four) hours as needed for wheezing or shortness of breath. 08/25/16   Lloyd Huger, MD  carvedilol (COREG) 3.125 MG tablet Take 1 tablet (3.125 mg total) by mouth 2 (two) times daily with a meal. Patient not taking: Reported on 09/29/2016 05/13/16   Vaughan Basta, MD  dexamethasone (DECADRON) 4 MG tablet Take 1 tablet (4 mg total) by mouth daily. 08/25/16   Lloyd Huger, MD  esomeprazole (NEXIUM) 40 MG capsule Take 1 capsule (40 mg total) by mouth daily at 12 noon. 09/22/16   Lloyd Huger, MD  Fluticasone-Salmeterol (ADVAIR DISKUS) 100-50 MCG/DOSE AEPB Inhale 1 puff into the lungs 2 (two) times daily. 09/29/16   Lloyd Huger, MD  HYDROcodone-acetaminophen (NORCO) 5-325 MG tablet Take 1 tablet by mouth every 6 (six) hours as needed for severe pain. Patient not taking: Reported on 09/22/2016 04/07/16   Lloyd Huger, MD  potassium chloride (K-DUR) 10 MEQ tablet Take 1 tablet (10 mEq total) by mouth daily. Patient not taking: Reported on 09/29/2016 05/13/16   Vaughan Basta, MD  senna-docusate (SENOKOT-S) 8.6-50 MG tablet Take 1 tablet by mouth at bedtime as needed for mild constipation. Patient not taking: Reported on 09/22/2016 05/13/16   Vaughan Basta, MD    Allergies  Patient has no known allergies.    Social History Social History  Substance Use Topics  . Smoking status: Former Smoker    Years: 35.00  . Smokeless tobacco: Never Used  . Alcohol use No    Review of Systems Patient denies headaches, rhinorrhea, blurry vision, numbness, shortness of breath, chest pain, edema, cough, abdominal pain, nausea, vomiting, diarrhea, dysuria, fevers, rashes or hallucinations unless otherwise stated above in  HPI. ____________________________________________   PHYSICAL EXAM:  VITAL SIGNS: Vitals:   10/05/16 1544  BP: 125/87  Pulse: (!) 152  Resp: (!) 28  SpO2: 97%    Constitutional: Alert and oriented. Cachectic, Critically ill appearing, in moderate resp distress. Eyes: Conjunctivae are normal.  Head: Atraumatic. Nose: No congestion/rhinnorhea. Mouth/Throat: Mucous membranes are moist.   Neck: No stridor. Painless ROM.  Cardiovascular: tachycardic rate, regular rhythm. Grossly normal heart sounds.  Delayed cap refill Respiratory: tachypnea, diminished BS in right upper lung fields Gastrointestinal: Soft and nontender. No distention. No abdominal bruits. No CVA tenderness. Musculoskeletal: No lower extremity tenderness nor edema.  No joint effusions. Neurologic:  Normal speech and language. No gross focal neurologic deficits are appreciated. No facial droop Skin:  Skin is cool, dry and intact. No rash noted. Psychiatric: Mood and affect are normal. Speech and behavior are normal.  ____________________________________________   LABS (all labs ordered are listed, but only abnormal results are displayed)  No results found for this or any previous visit (from the past 24 hour(s)). ____________________________________________  EKG My review and personal interpretation at Time: 15:54   Indication: tachycardia  Rate: 143  Rhythm: sinus Axis: normal Other: non specific st changes likely rate dependent, no stemi ____________________________________________  RADIOLOGY  I personally reviewed all radiographic images ordered to evaluate for the above acute complaints and reviewed radiology reports and findings.  These findings were personally discussed with the patient.  Please see medical record for radiology report.  ____________________________________________   PROCEDURES  Procedure(s) performed:  Procedures    Critical Care performed: yes CRITICAL CARE Performed by:  Merlyn Lot   Total critical care time: 45 minutes  Critical care time was exclusive of separately billable procedures and treating other patients.  Critical care was necessary to treat or prevent imminent or life-threatening deterioration.  Critical care was time spent personally by me on the following activities: development of treatment plan with patient and/or surrogate as well as nursing, discussions with consultants, evaluation of patient's response to treatment, examination of patient, obtaining history from patient or surrogate, ordering and performing treatments and interventions, ordering and review of laboratory studies, ordering and review of radiographic studies, pulse oximetry and re-evaluation of patient's condition.  ____________________________________________   INITIAL IMPRESSION / ASSESSMENT AND PLAN / ED COURSE  Pertinent labs & imaging results that were available during my care of the patient were reviewed by me and considered in my medical decision making (see chart for details).  DDX: Asthma, copd, CHF, pna, ptx, malignancy, Pe, anemia   [name] is a 76 y.o. who presents to the ED with shortness of breath and chest discomfort presents critically ill to the ER. He is currently protecting his airway. Based on his past medical history will initiate septic workup. We'll start with IV fluids as well as nebulizer treatment. EKG shows sinus tachycardia. Sinus malignancy history and chest x-ray showing no evidence of significant change from previous do feel the patient will need CT angiogram to evaluate for pulmonary embolism. May also simply be secondary to profound hypovolemia.  The patient will be placed on continuous pulse oximetry and telemetry for monitoring.  Laboratory evaluation will be sent to evaluate for the above complaints.     Clinical Course as of Oct 05 1805  Tue Oct 05, 2016  1710 Patient with improvement and his tachycardia in symptoms after  nebulizer treatment and IV fluid boluses. Patient still with some tachycardia. Patient with profound lactic acidosis to some 0.4. No leukocytosis. Will continue IV fluids. Patient taken to CT angiogram  [PR]  1741 Patient now feeling much improved. Respiratory distress has resolved after nebulizers.  On my review CT scan he does still have a persistent consolidation in the right apical lung field. May be source of infection. The patient is without any leukocytosis or fever.  We'll continue IV fluids. Patient is artery received IV antibiotics. I spoke with Dr. Linus Mako regarding admission for further hemodynamic  monitoring and therapy.  Have discussed with the patient and available family all diagnostics and treatments performed thus far and all questions were answered to the best of my ability. The patient demonstrates understanding and agreement with plan.   [PR]    Clinical Course User Index [PR] Merlyn Lot, MD     ____________________________________________   FINAL CLINICAL IMPRESSION(S) / ED DIAGNOSES  Final diagnoses:  Acute respiratory distress  Tachycardia  Lactic acidosis  HCAP (healthcare-associated pneumonia)  Sepsis, due to unspecified organism [facility])      NEW MEDICATIONS STARTED DURING THIS VISIT:  New Prescriptions   No medications on file     Note:  This document was prepared using Dragon voice recognition software and may include unintentional dictation errors.    Merlyn Lot, MD 10/05/16 [phone]

## 2016-10-05 NOTE — ED Notes (Signed)
Patient transported to CT 

## 2016-10-06 DIAGNOSIS — R5383 Other fatigue: Secondary | ICD-10-CM

## 2016-10-06 DIAGNOSIS — Z923 Personal history of irradiation: Secondary | ICD-10-CM

## 2016-10-06 DIAGNOSIS — C787 Secondary malignant neoplasm of liver and intrahepatic bile duct: Secondary | ICD-10-CM

## 2016-10-06 DIAGNOSIS — J984 Other disorders of lung: Secondary | ICD-10-CM

## 2016-10-06 DIAGNOSIS — R531 Weakness: Secondary | ICD-10-CM

## 2016-10-06 DIAGNOSIS — Z9221 Personal history of antineoplastic chemotherapy: Secondary | ICD-10-CM

## 2016-10-06 DIAGNOSIS — Z79899 Other long term (current) drug therapy: Secondary | ICD-10-CM

## 2016-10-06 DIAGNOSIS — Z85841 Personal history of malignant neoplasm of brain: Secondary | ICD-10-CM

## 2016-10-06 DIAGNOSIS — J189 Pneumonia, unspecified organism: Secondary | ICD-10-CM

## 2016-10-06 DIAGNOSIS — R5381 Other malaise: Secondary | ICD-10-CM

## 2016-10-06 DIAGNOSIS — R0602 Shortness of breath: Secondary | ICD-10-CM

## 2016-10-06 DIAGNOSIS — Z87891 Personal history of nicotine dependence: Secondary | ICD-10-CM

## 2016-10-06 DIAGNOSIS — Z85110 Personal history of malignant carcinoid tumor of bronchus and lung: Secondary | ICD-10-CM

## 2016-10-06 DIAGNOSIS — R05 Cough: Secondary | ICD-10-CM

## 2016-10-06 DIAGNOSIS — R630 Anorexia: Secondary | ICD-10-CM

## 2016-10-06 DIAGNOSIS — R634 Abnormal weight loss: Secondary | ICD-10-CM

## 2016-10-06 DIAGNOSIS — C187 Malignant neoplasm of sigmoid colon: Secondary | ICD-10-CM

## 2016-10-06 DIAGNOSIS — A419 Sepsis, unspecified organism: Principal | ICD-10-CM

## 2016-10-06 DIAGNOSIS — R627 Adult failure to thrive: Secondary | ICD-10-CM

## 2016-10-06 LAB — GASTROINTESTINAL PANEL BY PCR, STOOL (REPLACES STOOL CULTURE)

## 2016-10-06 LAB — BASIC METABOLIC PANEL
Anion gap: 4 — ABNORMAL LOW (ref 5–15)
BUN: 19 mg/dL (ref 6–20)
CALCIUM: 8.1 mg/dL — AB (ref 8.9–10.3)
CHLORIDE: 109 mmol/L (ref 101–111)
CO2: 27 mmol/L (ref 22–32)
CREATININE: 0.52 mg/dL — AB (ref 0.61–1.24)
GFR calc non Af Amer: >60 mL/min (ref >60)
GLUCOSE: 83 mg/dL (ref 65–99)
Potassium: 4 mmol/L (ref 3.5–5.1)
Sodium: 140 mmol/L (ref 135–145)

## 2016-10-06 LAB — C DIFFICILE QUICK SCREEN W PCR REFLEX
C DIFFICLE (CDIFF) ANTIGEN: NEGATIVE
C Diff interpretation: NOT DETECTED
C Diff toxin: NEGATIVE

## 2016-10-06 LAB — CBC
HCT: 25.1 % — ABNORMAL LOW (ref 40.0–52.0)
HCT: 29.6 % — ABNORMAL LOW (ref 40.0–52.0)
HEMOGLOBIN: 9.7 g/dL — AB (ref 13.0–18.0)
Hemoglobin: 8.2 g/dL — ABNORMAL LOW (ref 13.0–18.0)
MCH: 26.9 pg (ref 26.0–34.0)
MCH: 27.2 pg (ref 26.0–34.0)
MCHC: 32.7 g/dL (ref 32.0–36.0)
MCHC: 32.9 g/dL (ref 32.0–36.0)
MCV: 82 fL (ref 80.0–100.0)
MCV: 82.8 fL (ref 80.0–100.0)
PLATELETS: 52 10*3/uL — AB (ref 150–440)
PLATELETS: 57 10*3/uL — AB (ref 150–440)
RBC: 3.06 MIL/uL — AB (ref 4.40–5.90)
RBC: 3.57 MIL/uL — AB (ref 4.40–5.90)
RDW: 18.9 % — AB (ref 11.5–14.5)
RDW: 18.6 % — ABNORMAL HIGH (ref 11.5–14.5)
WBC: 3.4 10*3/uL — ABNORMAL LOW (ref 3.8–10.6)
WBC: 1.4 10*3/uL — AB (ref 3.8–10.6)

## 2016-10-06 LAB — MRSA PCR SCREENING: MRSA BY PCR: NEGATIVE

## 2016-10-06 LAB — LACTIC ACID, PLASMA: Lactic Acid, Venous: 1.3 mmol/L (ref 0.5–1.9)

## 2016-10-06 MED ORDER — BENZONATATE 100 MG PO CAPS
100.0000 mg | ORAL_CAPSULE | Freq: Three times a day (TID) | ORAL | Status: DC
  Administered 2016-10-06 – 2016-10-07 (×4): 100 mg via ORAL
  Filled 2016-10-06 (×4): qty 1

## 2016-10-06 MED ORDER — METOPROLOL TARTRATE 25 MG PO TABS
12.5000 mg | ORAL_TABLET | Freq: Once | ORAL | Status: AC
  Administered 2016-10-06: 12.5 mg via ORAL
  Filled 2016-10-06: qty 1

## 2016-10-06 MED ORDER — ADULT MULTIVITAMIN W/MINERALS CH
1.0000 | ORAL_TABLET | Freq: Every day | ORAL | Status: DC
  Administered 2016-10-07: 10:00:00 1 via ORAL
  Filled 2016-10-06: qty 1

## 2016-10-06 MED ORDER — GUAIFENESIN-DM 100-10 MG/5ML PO SYRP
5.0000 mL | ORAL_SOLUTION | ORAL | Status: DC | PRN
  Filled 2016-10-06 (×2): qty 5

## 2016-10-06 NOTE — Progress Notes (Signed)
PT Cancellation Note  Patient Details Name: [name] MRN: [id number] DOB: [birth year]   Cancelled Treatment:    Reason Eval/Treat Not Completed: Medical issues which prohibited therapy. PT consult received. Chart reviewed. Nursing in room obtaining stool sample upon PT arrival. Pt's O2 desaturated to 82% on RA; unable to return to baseline O2 for an extended time. Nursing present and reports they are going to place pt on O2 via [place]. Will re-attempt PT evaluation at later date/time as appropriate.   Laqueta Carina, SPT 10/06/16,1:21 PM [phone]

## 2016-10-06 NOTE — Progress Notes (Signed)
Lab called with Critical WBC count of 1.4- MD, Dr. Leslye Peer paged and made aware of critical lab value.

## 2016-10-06 NOTE — Progress Notes (Signed)
Initial Nutrition Assessment  DOCUMENTATION CODES:   Severe malnutrition in context of chronic illness, Underweight  INTERVENTION:  Provide Ensure Enlive po BID, each supplement provides 350 kcal and 20 grams of protein.   Provide Magic cup TID with meals, each supplement provides 290 kcal and 9 grams of protein.  Provide daily multivitamin with minerals.  NUTRITION DIAGNOSIS:   Malnutrition (Severe) related to chronic illness (stage IV colon cancer on chemo, hx stage IV lung cancer) as evidenced by severe depletion of body fat, severe depletion of muscle mass.  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   -Patient is followed by outpatient RD at cancer center.  Spoke with patient at bedside. He is very tired and unable to contribute much history. He reports he is having trouble swallowing and difficulty breathing. He reports his appetite has been decreased. He is only eating 1/2 meal at home daily. He reports he has been drinking Boost TID. Denies any N/V, abdominal pain, diarrhea. Unable to provide any other details.  UBW was around 155 lbs. Patient was 155.2 lbs on 10/06/2015. He has lost 25.4 lbs (16.4% body weight) over the past year, which is not significant for time frame. Of that, he has lost 19.6 lbs (13.1% body weight) over the  past 3 months, which is significant for time frame.  Medications reviewed and include: azithromycin, cefepime, Diflucan, vancomycin.  Labs reviewed: Creatinine 0.52.  Nutrition-Focused physical exam completed. Findings are severe fat depletion, severe muscle depletion, and no edema.   Discussed with RN.   Diet Order:  Diet full liquid Room service appropriate? Yes; Fluid consistency: Thin  Skin:  Reviewed, no issues  Last BM:  10/06/2016 - type 2  Height:   Ht Readings from Last 1 Encounters:  10/05/16 6\' 2"  (1.88 m)    Weight:   Wt Readings from Last 1 Encounters:  10/06/16 129 lb 12.8 oz (58.9 kg)    Ideal Body Weight:  86.4 kg  BMI:  Body mass index is 16.67 kg/m.  Estimated Nutritional Needs:   Kcal:  1825-2100 (MSJ x 1.3-1.5)  Protein:  90-100 grams (1.5-1.7 grams/kg)  Fluid:  1.5-1.8 L/day (25-30 ml/kg)  EDUCATION NEEDS:   Education needs no appropriate at this time (Patient reports he is not feeling well enough at this time.)  Willey Blade, MS, RD, LDN Pager: [phone] After Hours Pager: [phone]

## 2016-10-06 NOTE — Progress Notes (Signed)
Patient ID: [name], male   DOB: [birth year], 76 y.o.   MRN: [id number]  [name] PROGRESS NOTE  [name] IOX:[id number] DOB: [birth year] DOA: 10/05/2016 PCP: Adam Noble, MD  HPI/Subjective: Patient seen earlier and he was doing a little bit better than he is doing now. I was called from the nursing staff and by pulmonology that he's breathing harder at this point. The patient states that when he moves around he gets really short of breath. The nurse turned him over to clean up a bowel movement and after that he became really short of breath and needed to be placed on oxygen. The patient's major complaint is cough. He also stated he had 12 bowel movements yesterday.  Objective: Vitals:   10/06/16 0449 10/06/16 1246  BP: 100/68 123/86  Pulse: 90 (!) 110  Resp: 20 (!) 26  Temp: 98.1 F (36.7 C)   SpO2: 96% 94%    Filed Weights   10/05/16 1612 10/05/16 1943 10/06/16 0500  Weight: 59.9 kg (132 lb) 59.9 kg (132 lb) 58.9 kg (129 lb 12.8 oz)    ROS: Review of Systems  Constitutional: Negative for chills and fever.  Eyes: Negative for blurred vision.  Respiratory: Positive for cough and shortness of breath.   Cardiovascular: Negative for chest pain.  Gastrointestinal: Positive for diarrhea. Negative for abdominal pain, constipation, nausea and vomiting.  Genitourinary: Negative for dysuria.  Musculoskeletal: Negative for joint pain.  Neurological: Negative for dizziness and headaches.   Exam: Physical Exam  Constitutional: He is oriented to person, place, and time.  HENT:  Nose: No mucosal edema.  Mouth/Throat: No oropharyngeal exudate or posterior oropharyngeal edema.  Eyes: Pupils are equal, round, and reactive to light. Conjunctivae, EOM and lids are normal.  Neck: No JVD present. Carotid bruit is not present. No edema present. No thyroid mass and no thyromegaly present.  Cardiovascular: S1 normal and S2 normal.  Exam reveals no gallop.   No murmur  heard. Pulses:      Dorsalis pedis pulses are 2+ on the right side, and 2+ on the left side.  Respiratory: No respiratory distress. He has decreased breath sounds in the right lower field and the left lower field. He has no wheezes. He has no rhonchi. He has no rales.  GI: Soft. Bowel sounds are normal. There is no tenderness.  Musculoskeletal:       Right ankle: He exhibits no swelling.       Left ankle: He exhibits no swelling.  Lymphadenopathy:    He has no cervical adenopathy.  Neurological: He is alert and oriented to person, place, and time. No cranial nerve deficit.  Skin: Skin is warm. No rash noted. Nails show no clubbing.  Psychiatric: He has a normal mood and affect.      Data Reviewed: Basic Metabolic Panel:  Recent Labs Lab 10/05/16 1613 10/06/16 0533  NA 138 140  K 4.4 4.0  CL 101 109  CO2 22 27  GLUCOSE 280* 83  BUN 33* 19  CREATININE 0.98 0.52*  CALCIUM 9.1 8.1*   Liver Function Tests:  Recent Labs Lab 10/05/16 1613  AST 44*  ALT 29  ALKPHOS 240*  BILITOT 1.1  PROT 7.5  ALBUMIN 2.5*   CBC:  Recent Labs Lab 10/05/16 1613 10/06/16 0533  WBC 7.5 3.4*  NEUTROABS 6.2  --   HGB 11.2* 8.2*  HCT 34.6* 25.1*  MCV 83.8 82.0  PLT 91* 52*   Cardiac Enzymes:  Recent Labs Lab  10/05/16 1613  TROPONINI 0.03*   BNP (last 3 results)  Recent Labs  11/23/15 2151 05/10/16 1040  BNP 41.0 127.0*     Recent Results (from the past 240 hour(s))  Blood Culture (routine x 2)     Status: None (Preliminary result)   Collection Time: 10/05/16  4:13 PM  Result Value Ref Range Status   Specimen Description BLOOD RIGHT ANTECUBITAL  Final   Special Requests   Final    BOTTLES DRAWN AEROBIC AND ANAEROBIC Blood Culture adequate volume   Culture NO GROWTH < 24 HOURS  Final   Report Status PENDING  Incomplete  Blood Culture (routine x 2)     Status: None (Preliminary result)   Collection Time: 10/05/16  4:13 PM  Result Value Ref Range Status   Specimen  Description BLOOD PICC LINE  Final   Special Requests   Final    BOTTLES DRAWN AEROBIC AND ANAEROBIC Blood Culture adequate volume   Culture NO GROWTH < 24 HOURS  Final   Report Status PENDING  Incomplete  MRSA PCR Screening     Status: None   Collection Time: 10/05/16 11:22 PM  Result Value Ref Range Status   MRSA by PCR NEGATIVE NEGATIVE Final    Comment:        The GeneXpert MRSA Assay (FDA approved for NASAL specimens only), is one component of a comprehensive MRSA colonization surveillance program. It is not intended to diagnose MRSA infection nor to guide or monitor treatment for MRSA infections.   C difficile quick scan w PCR reflex     Status: None   Collection Time: 10/06/16 12:21 PM  Result Value Ref Range Status   C Diff antigen NEGATIVE NEGATIVE Final   C Diff toxin NEGATIVE NEGATIVE Final   C Diff interpretation No C. difficile detected.  Final     Studies: Ct Angio Chest Pe W And/or Wo Contrast  Result Date: 10/05/2016 CLINICAL DATA:  Dyspnea in diaphoresis history of lung cancer. Shortness of breath. EXAM: CT ANGIOGRAPHY CHEST WITH CONTRAST TECHNIQUE: Multidetector CT imaging of the chest was performed using the standard protocol during bolus administration of intravenous contrast. Multiplanar CT image reconstructions and MIPs were obtained to evaluate the vascular anatomy. CONTRAST:  75 cc Isovue 370 intravenously. COMPARISON:  Body CT 08/20/2016 FINDINGS: Cardiovascular: Satisfactory opacification of the pulmonary arteries to the segmental level. No evidence of pulmonary embolism. Normal heart size. No pericardial effusion. Calcific atherosclerotic disease of the aorta and coronary arteries. Mediastinum/Nodes: Mild right mediastinal lymphadenopathy. Left IJ approach central venous catheter terminates at the cavoatrial junction. Lungs/Pleura: Interval development of thick-walled cavitation within the known right upper lobe mass versus airspace consolidation. The area of  cavitation measures up to 7.2 cm. Extensive patchy and ground-glass airspace consolidations in the surrounding lung parenchyma. There has been also interval development of any predominantly peribronchovascular ground-glass nodularity in the aerated portion of the right lower lobe. Peribronchial ground-glass opacities are seen to a lesser degree throughout the left lung. Stable left upper lobe 1 cm solid pulmonary nodule. Left lower lobe subpleural pulmonary nodule, stable at 8 mm. Upper Abdomen: Known widespread hepatic metastatic disease is poorly visualized due to the arterial phase of the study. Bilateral renal cysts. Musculoskeletal: Stable heterogeneous attenuation of the posterior aspect of T10 vertebral body, nonspecific. Review of the MIP images confirms the above findings. IMPRESSION: Interval development of large thick-walled cavitation within the known right upper lobe mass/airspace consolidation, and extensive bronchial thickening and predominantly peribronchovascular ground-glass nodularity  throughout the right lung and less likely throughout the left lower lobe. Findings may represent advancement of malignancy with lymphangitic spread of disease versus superimposed development of multifocal pneumonia. Stable appearance of left-sided pulmonary nodules. Stable, considering difference in technique, hepatic metastatic disease. Aortic Atherosclerosis (ICD10-I70.0). Electronically Signed   By: Fidela Salisbury M.D.   On: 10/05/2016 17:50   Dg Chest Port 1 View  Result Date: 10/05/2016 CLINICAL DATA:  Shortness of breath and chest pain. History of colon cancer. EXAM: PORTABLE CHEST 1 VIEW COMPARISON:  09/10/2016 FINDINGS: Left jugular Port-A-Cath terminates over the mid SVC, unchanged. The cardiac silhouette is normal in size. Aortic atherosclerosis is noted. There is chronic right upper lobe volume loss with consolidative opacity at the apex and right hilar retraction, not significantly changed. An 11  mm nodule in the left mid lung is unchanged. No acute airspace consolidation, sizeable pleural effusion, or pneumothorax is identified. IMPRESSION: 1. Chronic right upper lobe volume loss without evidence of acute cardiopulmonary process. 2. Unchanged left mid lung nodule. Electronically Signed   By: Logan Bores M.D.   On: 10/05/2016 16:28    Scheduled Meds: . azithromycin  500 mg Oral Daily  . benzonatate  100 mg Oral TID  . enoxaparin (LOVENOX) injection  40 mg Subcutaneous Q24H  . feeding supplement (ENSURE ENLIVE)  237 mL Oral BID BM  . multivitamin with minerals  1 tablet Oral Daily   Continuous Infusions: . ceFEPime (MAXIPIME) IV Stopped (10/06/16 0604)  . fluconazole (DIFLUCAN) IV Stopped (10/05/16 2330)  . vancomycin Stopped (10/06/16 8938)    Assessment/Plan:  1. Clinical sepsis, cavitary lesion, multifocal pneumonia. Aggressive antibiotics with vancomycin and Maxipime. 2. Oral thrush and esophageal candidiasis on fluconazole 3. Stage IV colon cancer. CT scan showing lymphangitic spread. Overall prognosis poor. Patient will be a candidate for hospice. Awaiting palliative care consultation 4. Acute respiratory failure after turning the patient over. Patient now requiring oxygen and increased respiratory rate. Patient is a DO NOT RESUSCITATE. Overall prognosis is poor. 5. Diarrhea. Stool studies sent off 6. Severe protein calorie malnutrition 7. Pancytopenia with history of cancer  Code Status:     Code Status Orders        Start     Ordered   10/05/16 1811  Do not attempt resuscitation (DNR)  Continuous    Question Answer Comment  In the event of cardiac or respiratory ARREST Do not call a "code blue"   In the event of cardiac or respiratory ARREST Do not perform Intubation, CPR, defibrillation or ACLS   In the event of cardiac or respiratory ARREST Use medication by any route, position, wound care, and other measures to relive pain and suffering. May use oxygen,  suction and manual treatment of airway obstruction as needed for comfort.      10/05/16 1810    Code Status History    Date Active Date Inactive Code Status Order ID Comments User Context   10/05/2016  5:53 PM 10/05/2016  6:10 PM Full Code [id number]  Hillary Bow, MD ED   05/10/2016  3:54 PM 05/13/2016  3:50 PM Full Code [id number]  Demetrios Loll, MD Inpatient   11/24/2015  3:32 AM 11/25/2015  5:46 PM Full Code [id number]  Harrie Foreman, MD Inpatient     Family Communication: Spoke with wife on phone. Disposition Plan: Wife is unlikely going to be able to take care of this patient at home.  Consultants:  Pulmonology  Oncology  Palliative care  Antibiotics: Vancomycin Cefepime  Diflucan    time spent on care today 28 minutes    Dr. Loletha Grayer

## 2016-10-06 NOTE — Consult Note (Signed)
[place]  [phone] Fax:(336) [phone]  ID: [name] OB: 08-02-1940  MR#: [id number]  QJJ#:[id number]  Patient Care Team: Marden Noble, MD as PCP - General (Internal Medicine) [name], MD (Oncology) Christene Lye, MD (General Surgery)  CHIEF COMPLAINT: Stage IV colon cancer, worsening shortness of breath and cough, failure to thrive.  INTERVAL HISTORY: Patient is a 76 year old male who last received chemotherapy for stage IV colon cancer 1 week ago. He had mild shortness of breath at that clinic visit. Over the weekend patient states shortness of breath and cough became significantly worse. He continues to have increased weakness and fatigue and declining performance status. He has a poor appetite and has lost weight. He has no neurologic complaints. He denies any fevers. He denies any chest pain. He denies any nausea, vomiting, constipation, or diarrhea. He has no urinary complaints. Patient otherwise feels well and offers no further specific complaints today.  REVIEW OF SYSTEMS:   Review of Systems  Constitutional: Positive for malaise/fatigue and weight loss. Negative for fever.  Respiratory: Positive for cough and shortness of breath. Negative for hemoptysis.   Cardiovascular: Negative.  Negative for chest pain and leg swelling.  Gastrointestinal: Negative.  Negative for abdominal pain and nausea.  Genitourinary: Negative.   Musculoskeletal: Negative.   Skin: Negative.  Negative for rash.  Neurological: Positive for weakness. Negative for sensory change.  Psychiatric/Behavioral: Negative.  The patient is not nervous/anxious.     As per HPI. Otherwise, a complete review of systems is negative.  PAST MEDICAL HISTORY: Past Medical History:  Diagnosis Date  . Brain cancer ([place]) 01/2014   Chemotherapy and radiation treatment  . Colon cancer ([place]) 01/2014  . Kidney stones 1975  . Liver cancer ([place])   . Lung cancer [facility])  2014   Radiation therapy  . Prostate cancer [facility]) 2005   treated by Dr Eliberto Ivory    PAST SURGICAL HISTORY: Past Surgical History:  Procedure Laterality Date  . COLON SURGERY  05-03-14   Resection of Sigmoid Colon and liver biopsy  . kidney stones  1975  . PORTACATH PLACEMENT  2014  . PROSTATE SURGERY  2005   partial removal due to cancer    FAMILY HISTORY: Family History  Problem Relation Age of Onset  . Lung cancer Father   . Alzheimer's disease Mother     ADVANCED DIRECTIVES (Y/N):  @ADVDIR @  HEALTH MAINTENANCE: Social History  Substance Use Topics  . Smoking status: Former Smoker    Years: 35.00  . Smokeless tobacco: Never Used  . Alcohol use No     Colonoscopy:  PAP:  Bone density:  Lipid panel:  No Known Allergies  Current Facility-Administered Medications  Medication Dose Route Frequency Provider Last Rate Last Dose  . acetaminophen (TYLENOL) tablet 650 mg  650 mg Oral Q6H PRN Hillary Bow, MD       Or  . acetaminophen (TYLENOL) suppository 650 mg  650 mg Rectal Q6H PRN Sudini, Srikar, MD      . albuterol (PROVENTIL) (2.5 MG/3ML) 0.083% nebulizer solution 2.5 mg  2.5 mg Nebulization Q2H PRN Sudini, Alveta Heimlich, MD      . azithromycin (ZITHROMAX) tablet 500 mg  500 mg Oral Daily Hillary Bow, MD   500 mg at 10/06/16 1021  . benzonatate (TESSALON) capsule 100 mg  100 mg Oral TID Loletha Grayer, MD   100 mg at 10/06/16 1204  . ceFEPIme (MAXIPIME) 2 g in dextrose 5 % 50 mL IVPB  2  g Intravenous Q12H Lenis Noon, [place]   Stopped at 10/06/16 5784  . enoxaparin (LOVENOX) injection 40 mg  40 mg Subcutaneous Q24H Hillary Bow, MD   40 mg at 10/05/16 2252  . feeding supplement (ENSURE ENLIVE) (ENSURE ENLIVE) liquid 237 mL  237 mL Oral BID BM Sudini, Srikar, MD   237 mL at 10/06/16 1021  . fluconazole (DIFLUCAN) IVPB 100 mg  100 mg Intravenous Q24H Hillary Bow, MD   Stopped at 10/05/16 2330  . guaiFENesin-dextromethorphan (ROBITUSSIN DM) 100-10 MG/5ML syrup 5 mL  5  mL Oral Q4H PRN Pyreddy, Reatha Harps, MD      . multivitamin with minerals tablet 1 tablet  1 tablet Oral Daily Wieting, Richard, MD      . ondansetron (ZOFRAN) tablet 4 mg  4 mg Oral Q6H PRN Hillary Bow, MD       Or  . ondansetron (ZOFRAN) injection 4 mg  4 mg Intravenous Q6H PRN Sudini, Srikar, MD      . oxyCODONE (Oxy IR/ROXICODONE) immediate release tablet 5 mg  5 mg Oral Q4H PRN Sudini, Srikar, MD      . polyethylene glycol (MIRALAX / GLYCOLAX) packet 17 g  17 g Oral Daily PRN Sudini, Srikar, MD      . sodium chloride flush (NS) 0.9 % injection 10-40 mL  10-40 mL Intracatheter PRN Sudini, Srikar, MD      . vancomycin (VANCOCIN) IVPB 1000 mg/200 mL premix  1,000 mg Intravenous Q18H Lenis Noon, [facility]   Stopped at 10/06/16 0218   Facility-Administered Medications Ordered in Other Encounters  Medication Dose Route Frequency Provider Last Rate Last Dose  . sodium chloride 0.9 % injection 10 mL  10 mL Intracatheter PRN [name], MD   10 mL at 07/10/14 1428    OBJECTIVE: Vitals:   10/06/16 0449 10/06/16 1246  BP: 100/68 123/86  Pulse: 90 (!) 110  Resp: 20 (!) 26  Temp: 98.1 F (36.7 C)   SpO2: 96% 94%     Body mass index is 16.67 kg/m.    ECOG FS:2 - Symptomatic, <50% confined to bed  General: thin, no acute distress. Eyes: Pink conjunctiva, anicteric sclera. Lungs: Clear to auscultation bilaterally. Heart: Regular rate and rhythm. No rubs, murmurs, or gallops. Abdomen: Soft, nontender, nondistended. No organomegaly noted, normoactive bowel sounds. Musculoskeletal: No edema, cyanosis, or clubbing. Neuro: Alert, answering all questions appropriately. Cranial nerves grossly intact. Skin: No rashes or petechiae noted. Psych: Normal affect.   LAB RESULTS:  Lab Results  Component Value Date   NA 140 10/06/2016   K 4.0 10/06/2016   CL 109 10/06/2016   CO2 27 10/06/2016   GLUCOSE 83 10/06/2016   BUN 19 10/06/2016   CREATININE 0.52 (L) 10/06/2016   CALCIUM 8.1 (L)  10/06/2016   PROT 7.5 10/05/2016   ALBUMIN 2.5 (L) 10/05/2016   AST 44 (H) 10/05/2016   ALT 29 10/05/2016   ALKPHOS 240 (H) 10/05/2016   BILITOT 1.1 10/05/2016   GFRNONAA >60 10/06/2016   GFRAA >60 10/06/2016    Lab Results  Component Value Date   WBC 3.4 (L) 10/06/2016   NEUTROABS 6.2 10/05/2016   HGB 8.2 (L) 10/06/2016   HCT 25.1 (L) 10/06/2016   MCV 82.0 10/06/2016   PLT 52 (L) 10/06/2016     STUDIES: Dg Chest 1 View  Result Date: 09/10/2016 CLINICAL DATA:  Shortness of breath and history of lung cancer EXAM: CHEST 1 VIEW COMPARISON:  Chest CT 08/20/2016 FINDINGS: Left chest wall  Port-A-Cath tip is in the lower SVC. There is a loculated pleural fluid collection at the right lung apex. There is atelectasis/ scarring of the right upper lobe. Otherwise, there is no focal airspace consolidation. No pulmonary edema. There is a nodule in the left mid lung by corresponds to a perifissural nodules seen on the prior chest CT. No pleural effusion on the left. IMPRESSION: 1. Loculated pleural fluid at the right lung apex with associated scarring/atelectasis. 2. Nodule in the left mid lung, unchanged compared to recent chest CT. Electronically Signed   By: Ulyses Jarred M.D.   On: 09/10/2016 15:31   Ct Angio Chest Pe W And/or Wo Contrast  Result Date: 10/05/2016 CLINICAL DATA:  Dyspnea in diaphoresis history of lung cancer. Shortness of breath. EXAM: CT ANGIOGRAPHY CHEST WITH CONTRAST TECHNIQUE: Multidetector CT imaging of the chest was performed using the standard protocol during bolus administration of intravenous contrast. Multiplanar CT image reconstructions and MIPs were obtained to evaluate the vascular anatomy. CONTRAST:  75 cc Isovue 370 intravenously. COMPARISON:  Body CT 08/20/2016 FINDINGS: Cardiovascular: Satisfactory opacification of the pulmonary arteries to the segmental level. No evidence of pulmonary embolism. Normal heart size. No pericardial effusion. Calcific atherosclerotic  disease of the aorta and coronary arteries. Mediastinum/Nodes: Mild right mediastinal lymphadenopathy. Left IJ approach central venous catheter terminates at the cavoatrial junction. Lungs/Pleura: Interval development of thick-walled cavitation within the known right upper lobe mass versus airspace consolidation. The area of cavitation measures up to 7.2 cm. Extensive patchy and ground-glass airspace consolidations in the surrounding lung parenchyma. There has been also interval development of any predominantly peribronchovascular ground-glass nodularity in the aerated portion of the right lower lobe. Peribronchial ground-glass opacities are seen to a lesser degree throughout the left lung. Stable left upper lobe 1 cm solid pulmonary nodule. Left lower lobe subpleural pulmonary nodule, stable at 8 mm. Upper Abdomen: Known widespread hepatic metastatic disease is poorly visualized due to the arterial phase of the study. Bilateral renal cysts. Musculoskeletal: Stable heterogeneous attenuation of the posterior aspect of T10 vertebral body, nonspecific. Review of the MIP images confirms the above findings. IMPRESSION: Interval development of large thick-walled cavitation within the known right upper lobe mass/airspace consolidation, and extensive bronchial thickening and predominantly peribronchovascular ground-glass nodularity throughout the right lung and less likely throughout the left lower lobe. Findings may represent advancement of malignancy with lymphangitic spread of disease versus superimposed development of multifocal pneumonia. Stable appearance of left-sided pulmonary nodules. Stable, considering difference in technique, hepatic metastatic disease. Aortic Atherosclerosis (ICD10-I70.0). Electronically Signed   By: Fidela Salisbury M.D.   On: 10/05/2016 17:50   Dg Chest Port 1 View  Result Date: 10/05/2016 CLINICAL DATA:  Shortness of breath and chest pain. History of colon cancer. EXAM: PORTABLE CHEST  1 VIEW COMPARISON:  09/10/2016 FINDINGS: Left jugular Port-A-Cath terminates over the mid SVC, unchanged. The cardiac silhouette is normal in size. Aortic atherosclerosis is noted. There is chronic right upper lobe volume loss with consolidative opacity at the apex and right hilar retraction, not significantly changed. An 11 mm nodule in the left mid lung is unchanged. No acute airspace consolidation, sizeable pleural effusion, or pneumothorax is identified. IMPRESSION: 1. Chronic right upper lobe volume loss without evidence of acute cardiopulmonary process. 2. Unchanged left mid lung nodule. Electronically Signed   By: Logan Bores M.D.   On: 10/05/2016 16:28    ASSESSMENT: Stage IV colon cancer, worsening shortness of breath and cough, failure to thrive.  PLAN:  1. 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 His CEA continues to trend down and is now 429. Return to clinic in 1 week for consideration of cycle 16.  Will consider repeat imaging after cycle 18. Palliative care consult has been placed. 2. Shortness of breath/cough: CT scan results reviewed independently and reported as above with interval development of large thick-walled cavitation in the right upper lobe. Possibly could be multifocal pneumonia or recurrence of patient's known history of lung cancer. Agree with pulmonary consult to assist with diagnosis. 3. Anemia: Hemoglobin decreased. Patient does not require blood transfusion at this time.  4. Thrombocytopenia: Secondary to chemotherapy one week ago, monitor. 5. Poor appetite: Improving. Continue dexamethasone 4 mg daily. 6. Shortness of breath: CT scan results as above. Appreciate pulmonary input.    Appreciate consult, will follow.  Lloyd Huger, MD   10/06/2016 1:44 PM

## 2016-10-06 NOTE — Consult Note (Signed)
Name: [name] MRN: [id number] DOB: Sep 30, 1940    ADMISSION DATE:  10/05/2016 CONSULTATION DATE: 10/05/2016  REFERRING MD : Dr. Darvin Neighbours  CHIEF COMPLAINT: Shortness of Breath   BRIEF PATIENT DESCRIPTION:  76 yo male admitted 08/21 with acute respiratory failure CT Angio Chest revealing thick walled cavitation within the right upper lobe findings concerning for worsening malignancy vs multifocal pneumonia   SIGNIFICANT EVENTS  08/21-Pt admitted to Oncology Unit   STUDIES:  CT Angio Chest 08/21>>Interval development of large thick-walled cavitation within the known right upper lobe mass/airspace consolidation, and extensive bronchial thickening and predominantly peribronchovascular ground-glass nodularity throughout the right lung and less likely throughout the left lower lobe. Findings may represent advancement of malignancy with lymphangitic spread of disease versus superimposed development of multifocal pneumonia. Stable appearance of left-sided pulmonary nodules. Stable, considering difference in technique, hepatic metastatic disease. 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  He presented to [facility] ER 08/22 with complaints of chest pain, shortness of breath, decreased appetite, and fatigue with worsening symptoms over the past several days.  In the ER he was tachycardic with hr 150's, lactic acid 7, and CXR showing chronic changes.  CT Angio Chest results revealed thick walled cavitation within the right upper lobe findings concerning for worsening malignancy vs multifocal pneumonia, therefore PCCM consulted.  His most recent oncology appointment was 09/29/16 pt had completed 15 cycles of chemotherapy due to  restaging results on 08/20/16 revealing progression of metastatic hepatic disease.  Most recent CEA results trending down now 429 post reinitiation of chemotherapy, therefore per oncology during pts last office visit plans for possible repeat imaging after cycle 18 of chemo treatment.      PAST MEDICAL HISTORY :   has a past medical history of Brain cancer ([place]) (01/2014); Colon cancer ([place]) (01/2014); Kidney stones (1975); Liver cancer ([place]); Lung cancer ([place]) (2014); and Prostate cancer ([place]) (2005).  has a past surgical history that includes kidney stones (1975); Prostate surgery (2005); Colon surgery (05-03-14); and Portacath placement (2014). Prior to Admission medications   Medication Sig Start Date End Date Taking? Authorizing Provider  albuterol (PROVENTIL HFA;VENTOLIN HFA) 108 (90 Base) MCG/ACT inhaler Inhale 2 puffs into the lungs every 4 (four) hours as needed for wheezing or shortness of breath. 08/25/16  Yes Lloyd Huger, MD  esomeprazole (NEXIUM) 40 MG capsule Take 1 capsule (40 mg total) by mouth daily at 12 noon. 09/22/16  Yes Lloyd Huger, MD  Fluticasone-Salmeterol (ADVAIR DISKUS) 100-50 MCG/DOSE AEPB Inhale 1 puff into the lungs 2 (two) times daily. 09/29/16  Yes Lloyd Huger, MD  carvedilol (COREG) 3.125 MG tablet Take 1 tablet (3.125 mg total) by mouth 2 (two) times daily with a meal. Patient not taking: Reported on 09/29/2016 05/13/16   Vaughan Basta, MD  dexamethasone (DECADRON) 4 MG tablet Take 1 tablet (4 mg total) by mouth daily. 08/25/16   Lloyd Huger, MD  HYDROcodone-acetaminophen (NORCO) 5-325 MG tablet Take 1 tablet by mouth every 6 (six) hours as needed for severe pain. Patient not taking: Reported on 09/22/2016 04/07/16   Lloyd Huger, MD  potassium chloride (K-DUR) 10 MEQ tablet Take 1 tablet (10 mEq total) by mouth daily. Patient not taking: Reported  on 09/29/2016 05/13/16   Vaughan Basta, MD  senna-docusate (SENOKOT-S) 8.6-50  MG tablet Take 1 tablet by mouth at bedtime as needed for mild constipation. Patient not taking: Reported on 09/22/2016 05/13/16   Vaughan Basta, MD   No Known Allergies  FAMILY HISTORY:  family history includes Alzheimer's disease in his mother; Lung cancer in his father. SOCIAL HISTORY:  reports that he has quit smoking. He quit after 35.00 years of use. He has never used smokeless tobacco. He reports that he does not drink alcohol or use drugs.  REVIEW OF SYSTEMS: Positives in BOLD  Could not obtain due to lethargy.   SUBJECTIVE:  Pt has no new complaints, falls asleep quickly and appears lethargic.   VITAL SIGNS: Temp:  [97.5 F (36.4 C)-98.1 F (36.7 C)] 98.1 F (36.7 C) (08/22 0449) Pulse Rate:  [42-152] 110 (08/22 1246) Resp:  [18-43] 26 (08/22 1246) BP: (100-145)/(67-97) 123/86 (08/22 1246) SpO2:  [92 %-100 %] 94 % (08/22 1246) Weight:  [58.9 kg (129 lb 12.8 oz)-59.9 kg (132 lb)] 58.9 kg (129 lb 12.8 oz) (08/22 0500)  PHYSICAL EXAMINATION: General:  Pt lethargic, arousable, but falls asleep quickly upon arousing.  Neuro:  Lethargic, move all limbs spontaneously.  HEENT:  PEERLA Cardiovascular:  Sinus tachycardia, no murmurs.  Lungs:  Decreased air entry bilaterally Abdomen:  Soft, NT/ND.  Musculoskeletal:  No tendernes Skin:  Normal.    Recent Labs Lab 10/05/16 1613 10/06/16 0533  NA 138 140  K 4.4 4.0  CL 101 109  CO2 22 27  BUN 33* 19  CREATININE 0.98 0.52*  GLUCOSE 280* 83    Recent Labs Lab 10/05/16 1613 10/06/16 0533  HGB 11.2* 8.2*  HCT 34.6* 25.1*  WBC 7.5 3.4*  PLT 91* 52*   Ct Angio Chest Pe W And/or Wo Contrast  Result Date: 10/05/2016 CLINICAL DATA:  Dyspnea in diaphoresis history of lung cancer. Shortness of breath. EXAM: CT ANGIOGRAPHY CHEST WITH CONTRAST TECHNIQUE: Multidetector CT imaging of the chest was performed using the standard protocol during bolus administration of intravenous contrast. Multiplanar CT image  reconstructions and MIPs were obtained to evaluate the vascular anatomy. CONTRAST:  75 cc Isovue 370 intravenously. COMPARISON:  Body CT 08/20/2016 FINDINGS: Cardiovascular: Satisfactory opacification of the pulmonary arteries to the segmental level. No evidence of pulmonary embolism. Normal heart size. No pericardial effusion. Calcific atherosclerotic disease of the aorta and coronary arteries. Mediastinum/Nodes: Mild right mediastinal lymphadenopathy. Left IJ approach central venous catheter terminates at the cavoatrial junction. Lungs/Pleura: Interval development of thick-walled cavitation within the known right upper lobe mass versus airspace consolidation. The area of cavitation measures up to 7.2 cm. Extensive patchy and ground-glass airspace consolidations in the surrounding lung parenchyma. There has been also interval development of any predominantly peribronchovascular ground-glass nodularity in the aerated portion of the right lower lobe. Peribronchial ground-glass opacities are seen to a lesser degree throughout the left lung. Stable left upper lobe 1 cm solid pulmonary nodule. Left lower lobe subpleural pulmonary nodule, stable at 8 mm. Upper Abdomen: Known widespread hepatic metastatic disease is poorly visualized due to the arterial phase of the study. Bilateral renal cysts. Musculoskeletal: Stable heterogeneous attenuation of the posterior aspect of T10 vertebral body, nonspecific. Review of the MIP images confirms the above findings. IMPRESSION: Interval development of large thick-walled cavitation within the known right upper lobe mass/airspace consolidation, and extensive bronchial thickening and predominantly peribronchovascular ground-glass nodularity throughout the right lung and less likely throughout the left lower lobe. Findings may represent  advancement of malignancy with lymphangitic spread of disease versus superimposed development of multifocal pneumonia. Stable appearance of left-sided  pulmonary nodules. Stable, considering difference in technique, hepatic metastatic disease. Aortic Atherosclerosis (ICD10-I70.0). Electronically Signed   By: Fidela Salisbury M.D.   On: 10/05/2016 17:50   Dg Chest Port 1 View  Result Date: 10/05/2016 CLINICAL DATA:  Shortness of breath and chest pain. History of colon cancer. EXAM: PORTABLE CHEST 1 VIEW COMPARISON:  09/10/2016 FINDINGS: Left jugular Port-A-Cath terminates over the mid SVC, unchanged. The cardiac silhouette is normal in size. Aortic atherosclerosis is noted. There is chronic right upper lobe volume loss with consolidative opacity at the apex and right hilar retraction, not significantly changed. An 11 mm nodule in the left mid lung is unchanged. No acute airspace consolidation, sizeable pleural effusion, or pneumothorax is identified. IMPRESSION: 1. Chronic right upper lobe volume loss without evidence of acute cardiopulmonary process. 2. Unchanged left mid lung nodule. Electronically Signed   By: Logan Bores M.D.   On: 10/05/2016 16:28    ASSESSMENT:  Lung cancer, metastatic.  Colon cancer metastatic.  Acute respiratory failure with large right lung cavitary lesion; suspect recurrent cancer vs infection though no fever or leukocytosis makes former more likely.  PLAN:  --Continue empiric abx --wean oxygen as tolerated.  --Will send TB quantiferon.  --Discussed with hospitalist physician, agree with palliative care consultion. The patient appears hospice appropriate given possibility of advancing cancer. Agree with DNR status.

## 2016-10-06 NOTE — Plan of Care (Signed)
Problem: Tissue Perfusion: Goal: Risk factors for ineffective tissue perfusion will decrease Outcome: Not Met (add Reason) Patient dyspneic with minimal exertion. Placed on 2liters via [place]. Encouraged to rest, unable to participate in much PT due to low O2 saturations. MD aware of patient's exertion and difficulty with O2 sats during these periods  Comments: Patient encouraged frequently to drink his Ensure. Placed on TID, cough pills to help combat cough. Patient still very dyspneic with minimal movement.  Patient requesting to rest, support provided.

## 2016-10-07 DIAGNOSIS — Z7189 Other specified counseling: Secondary | ICD-10-CM

## 2016-10-07 DIAGNOSIS — B3781 Candidal esophagitis: Secondary | ICD-10-CM

## 2016-10-07 DIAGNOSIS — C799 Secondary malignant neoplasm of unspecified site: Secondary | ICD-10-CM

## 2016-10-07 DIAGNOSIS — Z515 Encounter for palliative care: Secondary | ICD-10-CM

## 2016-10-07 DIAGNOSIS — B370 Candidal stomatitis: Secondary | ICD-10-CM

## 2016-10-07 LAB — URINE CULTURE: CULTURE: NO GROWTH

## 2016-10-07 LAB — BASIC METABOLIC PANEL
ANION GAP: 9 (ref 5–15)
BUN: 14 mg/dL (ref 6–20)
CO2: 27 mmol/L (ref 22–32)
Calcium: 8.2 mg/dL — ABNORMAL LOW (ref 8.9–10.3)
Chloride: 101 mmol/L (ref 101–111)
Creatinine, Ser: 0.57 mg/dL — ABNORMAL LOW (ref 0.61–1.24)
GLUCOSE: 70 mg/dL (ref 65–99)
POTASSIUM: 3.5 mmol/L (ref 3.5–5.1)
Sodium: 137 mmol/L (ref 135–145)

## 2016-10-07 MED ORDER — BIOTENE DRY MOUTH MT LIQD: 15.0000 mL | OROMUCOSAL | Status: DC | PRN

## 2016-10-07 MED ORDER — POTASSIUM CHLORIDE 20 MEQ PO PACK
40.0000 meq | PACK | Freq: Once | ORAL | Status: AC
  Administered 2016-10-07: 40 meq via ORAL
  Filled 2016-10-07: qty 2

## 2016-10-07 MED ORDER — MORPHINE SULFATE (CONCENTRATE) 10 MG/0.5ML PO SOLN: 5.0000 mg | ORAL | 0 refills | Status: AC | PRN

## 2016-10-07 MED ORDER — MAGIC MOUTHWASH
10.0000 mL | Freq: Three times a day (TID) | ORAL | Status: DC
  Administered 2016-10-07: 10 mL via ORAL
  Filled 2016-10-07: qty 10

## 2016-10-07 MED ORDER — MORPHINE SULFATE (CONCENTRATE) 10 MG/0.5ML PO SOLN
5.0000 mg | ORAL | Status: DC | PRN
Start: 2016-10-07 — End: 2016-10-07

## 2016-10-07 MED ORDER — MORPHINE SULFATE (CONCENTRATE) 10 MG/0.5ML PO SOLN
2.5000 mg | ORAL | Status: DC | PRN
  Administered 2016-10-07: 2.6 mg via ORAL
  Filled 2016-10-07: qty 1

## 2016-10-07 MED ORDER — BENZONATATE 100 MG PO CAPS: 100.0000 mg | ORAL_CAPSULE | Freq: Three times a day (TID) | ORAL | 0 refills | Status: AC

## 2016-10-07 MED ORDER — LORAZEPAM 2 MG/ML PO CONC: 0.2500 mg | Freq: Four times a day (QID) | ORAL | 0 refills | Status: AC | PRN

## 2016-10-07 MED ORDER — GLYCOPYRROLATE 0.2 MG/ML IJ SOLN
0.2000 mg | INTRAMUSCULAR | Status: DC | PRN
  Filled 2016-10-07: qty 1

## 2016-10-07 MED ORDER — LIDOCAINE VISCOUS 2 % MT SOLN
10.0000 mL | Freq: Three times a day (TID) | OROMUCOSAL | Status: DC
  Administered 2016-10-07: 10 mL via OROMUCOSAL
  Filled 2016-10-07: qty 10
  Filled 2016-10-07: qty 15

## 2016-10-07 MED ORDER — ENSURE ENLIVE PO LIQD: 237.0000 mL | Freq: Two times a day (BID) | ORAL | 0 refills | Status: AC

## 2016-10-07 MED ORDER — HALOPERIDOL 0.5 MG PO TABS
0.5000 mg | ORAL_TABLET | ORAL | Status: DC | PRN
  Filled 2016-10-07: qty 1

## 2016-10-07 MED ORDER — SODIUM CHLORIDE 0.9% FLUSH: 3.0000 mL | INTRAVENOUS | Status: DC | PRN

## 2016-10-07 MED ORDER — GLYCOPYRROLATE 1 MG PO TABS
1.0000 mg | ORAL_TABLET | ORAL | Status: DC | PRN
  Filled 2016-10-07: qty 1

## 2016-10-07 MED ORDER — LORAZEPAM 2 MG/ML PO CONC: 0.2500 mg | Freq: Four times a day (QID) | ORAL | Status: DC | PRN

## 2016-10-07 MED ORDER — MAGIC MOUTHWASH W/LIDOCAINE
10.0000 mL | Freq: Three times a day (TID) | ORAL | Status: DC
  Filled 2016-10-07: qty 10

## 2016-10-07 MED ORDER — ALBUTEROL SULFATE (2.5 MG/3ML) 0.083% IN NEBU: 2.5000 mg | INHALATION_SOLUTION | RESPIRATORY_TRACT | 12 refills | Status: AC | PRN

## 2016-10-07 MED ORDER — GLYCOPYRROLATE 1 MG PO TABS: 1.0000 mg | ORAL_TABLET | ORAL | 0 refills | Status: AC | PRN

## 2016-10-07 MED ORDER — HALOPERIDOL LACTATE 5 MG/ML IJ SOLN: 0.5000 mg | INTRAMUSCULAR | Status: DC | PRN

## 2016-10-07 MED ORDER — POLYVINYL ALCOHOL 1.4 % OP SOLN
1.0000 [drp] | Freq: Four times a day (QID) | OPHTHALMIC | Status: DC | PRN
  Filled 2016-10-07: qty 15

## 2016-10-07 MED ORDER — ONDANSETRON HCL 4 MG PO TABS: 4.0000 mg | ORAL_TABLET | Freq: Four times a day (QID) | ORAL | 0 refills | Status: AC | PRN

## 2016-10-07 MED ORDER — HALOPERIDOL LACTATE 2 MG/ML PO CONC
0.5000 mg | ORAL | Status: DC | PRN
  Filled 2016-10-07: qty 0.3

## 2016-10-07 MED ORDER — ORAL CARE MOUTH RINSE
15.0000 mL | Freq: Two times a day (BID) | OROMUCOSAL | Status: DC
  Administered 2016-10-07: 10:00:00 15 mL via OROMUCOSAL

## 2016-10-07 MED ORDER — SODIUM CHLORIDE 0.9% FLUSH
3.0000 mL | Freq: Two times a day (BID) | INTRAVENOUS | Status: DC
  Administered 2016-10-07: 13:00:00 3 mL via INTRAVENOUS

## 2016-10-07 MED ORDER — SODIUM CHLORIDE 0.9 % IV SOLN: 250.0000 mL | INTRAVENOUS | Status: DC | PRN

## 2016-10-07 NOTE — Discharge Instructions (Signed)

## 2016-10-07 NOTE — Clinical Social Work Note (Signed)
CSW consulted for [facility]. Arrangements have been for pt to transfer to [facility] today at 5 PM by Hospice Liaison.  CSW notified MD. CSW prepared discharge packet. [facility] will provide transportation. CSW is signing off as no further needs identified.   Darden Dates, MSW, LCSW Clinical Social Worker  [phone]

## 2016-10-07 NOTE — Progress Notes (Signed)
Pt is being discharged to Hospice home. Report given to [facility], RN.   AVS papers placed in discharge envelope. Awaiting EMS.

## 2016-10-07 NOTE — Progress Notes (Signed)
New referral for hospice home on a 76yo gentleman who is post palliative medicine consult- admitted to [facility] on 8.21.18 with sepsis.  He has history of prostate cancer, lung cancer, colon cancer and metastatic cancer to the liver and brain and  protein calorie malnutrition.  I spoke with the patient and his wife [name] regarding hospice home and hospice philosophy.  They are both in agreement with hospice services and request transition to the hospice home for management of his shortness of breath and pain.  Consents signed.  EMS to be arranged for 1700 pick up.  Referral information faxed to referral intake.  RN to call report to the hospice home admit line.  Thank you for allowing participation in this patient's care.  Dimas Aguas

## 2016-10-07 NOTE — Consult Note (Signed)
Consultation Note Date: 10/07/2016   Patient Name: [name]  DOB: Aug 27, 1940  MRN: [id number]  Age / Sex: 76 y.o., male  PCP: Marden Noble, MD Referring Physician: Loletha Grayer, MD  Reason for Consultation: Establishing goals of care  HPI/Patient Profile: 76 y.o. male  with past medical history of prostate CA, lung cancer, Colon CA, metastatic cancer to the brain and liver, as well as protein calorie malnutrition who was admitted on 10/05/2016 with severe sepsis.  Work up demonstrates a large cavitary lung lesion and significant weight loss. Oncology and pulmonology have been consulted.  Unfortunately pulmonology feels the cavitary lesion is likely to be cancer rather than pneumonia.  The patient also has significant thrush in his throat.  Clinical Assessment and Goals of Care:  I have reviewed medical records including EPIC notes, labs and imaging, received report from oncology and pulmonology, assessed the patient and then met at the bedside along with his wife [name]  to discuss diagnosis prognosis, [name], EOL wishes, disposition and options.  I introduced Palliative Medicine as specialized medical care for people living with serious illness. It focuses on providing relief from the symptoms and stress of a serious illness. The goal is to improve quality of life for both the patient and the family.  [name] and I stepped outside of the room and we discussed a brief life review of the patient. HE IS A VERY PRIVATE and RESPECTFUL MAN.  He withholds a lot of information from his family and for years he withheld information from his wife about his health.  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  Together they built [employer] in [place].  [name] is a spiritual man but is very private about his religious beliefs.  He has 1 daughter in [place].   His wife has 2 sons that he raised.  He and his wife have a very close relationship.  [name] has been in health care administration for 30+ years.  After retiring she worked for [employer] for 8 years.  As far as functional and nutritional status [name] is now unable to walk.  He eats bites and sips but has no appetite and swallowing is very painful.  He is very short of breath with any exertion (even being cleaned up in bed).  He is very thin and frail.  After a long conversation with [name] I visited with [name].  Early in our conversation [name] declares, "Im through".  I attempted to clarify and he stated, "I'm through with all of it".  We shifted gears and discussed Hospice services.  He feels that Hospice services at home would not be enough support "I'm requiring a lot of care". He would prefer to go to [facility] but is very concerned about the cost.  I advised him that I would ask Hospice to speak with him about finances.  We discussed symptoms - his primary complaint is painful swallowing, as  well as severe SOB.  He energy was so low that I felt it was import to conserve his energy.  I asked I he would like for me to speak with his brothers (at his wife's request) and he clearly told me NO.   Primary Decision Maker:  PATIENT    SUMMARY OF RECOMMENDATIONS    Patient requests Hospice House but is worried about expense.  Orders not related to comfort have been discontinued.   Comfort orders have been placed.  Patient complains of odynaphagia.  Code Status/Advance Care Planning:  DNR   Symptom Management:   SOB, pain - liquid morphine concentrate PRN and scheduled.  Anxiety, insomnia - liquid ativan PRN  Continue fluconazole IV while Inpatient.  Would changed to PO fluconazole suspension on discharge to [facility].  Will add magic mouth wash with nystatin TID for thrush  Additional Recommendations (Limitations, Scope, Preferences):  Full Comfort Care  Palliative  Prophylaxis:   Frequent Pain Assessment  Psycho-social/Spiritual:   Desire for further Chaplaincy support: NO  Prognosis:   Days to less than 2 weeks.  Patient with advanced cancer, Dyspnea with minimal movement, severe malnutrition who is now refusing aggressive medical therapy.  Discharge Planning: Hospice facility      Primary Diagnoses: Present on Admission: . Sepsis ([place])   I have reviewed the medical record, interviewed the patient and family, and examined the patient. The following aspects are pertinent.  Past Medical History:  Diagnosis Date  . Brain cancer ([place]) 01/2014   Chemotherapy and radiation treatment  . Colon cancer ([place]) 01/2014  . Kidney stones 1975  . Liver cancer ([place])   . Lung cancer [facility]) 2014   Radiation therapy  . Prostate cancer [facility]) 2005   treated by Dr Eliberto Ivory   Social History   Social History  . Marital status: Married    Spouse name: N/A  . Number of children: N/A  . Years of education: N/A   Social History Main Topics  . Smoking status: Former Smoker    Years: 35.00  . Smokeless tobacco: Never Used  . Alcohol use No  . Drug use: No  . Sexual activity: Not Asked   Other Topics Concern  . None   Social History Narrative  . None   Family History  Problem Relation Age of Onset  . Lung cancer Father   . Alzheimer's disease Mother    Scheduled Meds: . azithromycin  500 mg Oral Daily  . benzonatate  100 mg Oral TID  . feeding supplement (ENSURE ENLIVE)  237 mL Oral BID BM  . mouth rinse  15 mL Mouth Rinse BID  . sodium chloride flush  3 mL Intravenous Q12H   Continuous Infusions: . sodium chloride    . fluconazole (DIFLUCAN) IV Stopped (10/06/16 2159)   PRN Meds:.sodium chloride, acetaminophen **OR** acetaminophen, albuterol, antiseptic oral rinse, glycopyrrolate **OR** glycopyrrolate **OR** glycopyrrolate, guaiFENesin-dextromethorphan, haloperidol **OR** haloperidol **OR** haloperidol lactate, LORazepam, morphine  CONCENTRATE, ondansetron **OR** ondansetron (ZOFRAN) IV, oxyCODONE, polyethylene glycol, polyvinyl alcohol, sodium chloride flush, sodium chloride flush No Known Allergies Review of Systems Patient complains of odynophagia and SOB, weakness  Physical Exam  Well developed male, thin, chronically ill appearing, pallor CV tachy A &O, no acute distress, coherent but weak voice Dyspnea with any movement.   Vital Signs: BP 109/73 (BP Location: Left Arm)   Pulse (!) 114   Temp 98.6 F (37 C) (Oral)   Resp (!) 28   Ht _0  (1.88 m)   Wt  60.8 kg (134 lb)   SpO2 97%   BMI 17.20 kg/m  Pain Assessment: 0-10   Pain Score: 0-No pain   SpO2: SpO2: 97 % O2 Device:SpO2: 97 % O2 Flow Rate: .O2 Flow Rate (L/min): 3 L/min  IO: Intake/output summary:  Intake/Output Summary (Last 24 hours) at 10/07/16 1010 Last data filed at 10/07/16 0537  Gross per 24 hour  Intake              100 ml  Output              220 ml  Net             -120 ml    LBM: Last BM Date: 10/07/16 Baseline Weight: Weight: 59.9 kg (132 lb) Most recent weight: Weight: 60.8 kg (134 lb)     Palliative Assessment/Data:     Time In: 9:00 Time Out: 10:10 Time Total: 70 min. Greater than 50%  of this time was spent counseling and coordinating care related to the above assessment and plan.  Signed by: Florentina Jenny, PA-C Palliative Medicine Pager: [phone]  Please contact Palliative Medicine Team phone at [phone] for questions and concerns.  For individual provider: See Shea Evans

## 2016-10-07 NOTE — Discharge Summary (Signed)
[facility] [place] NAME: [name]    MR#:  [id number]  DATE OF BIRTH:  Nov 02, 1940  DATE OF ADMISSION:  10/05/2016 ADMITTING PHYSICIAN: Hillary Bow, MD  DATE OF DISCHARGE: 10/07/2016  PRIMARY CARE PHYSICIAN: Marden Noble, MD    ADMISSION DIAGNOSIS:  Acute respiratory distress [R06.03] Lactic acidosis [E87.2] Tachycardia [R00.0] HCAP (healthcare-associated pneumonia) [J18.9] Sepsis, due to unspecified organism ([place]) [A41.9]  DISCHARGE DIAGNOSIS:  Active Problems:   Sepsis ([place])   Metastatic cancer ([place])   Thrush of mouth and esophagus ([place])   Palliative care encounter   Encounter for hospice care discussion   SECONDARY DIAGNOSIS:   Past Medical History:  Diagnosis Date  . Brain cancer ([place]) 01/2014   Chemotherapy and radiation treatment  . Colon cancer ([place]) 01/2014  . Kidney stones 1975  . Liver cancer ([place])   . Lung cancer [facility]) 2014   Radiation therapy  . Prostate cancer [facility]) 2005   treated by Dr Levy Pupa COURSE:   1. Clinical sepsis, cavitary lesion in the lungs on CAT scan, multifocal pneumonia. Patient was given aggressive antibiotics with vancomycin and Maxipime. Upon discharge to the hospice home antibiotics will be stopped for comfort care measures. 2. Acute respiratory failure with hypoxia and increased respiratory rate. Continue oxygen supplementation 2 L nasal cannula.  3. Oral thrush and esophageal candidiasis. Patient was on IV fluconazole while here can stop this upon discharge the hospice home 4. Stage IV colon cancer with CT scan of the chest showing lymphangitic spread. Overall prognosis is poor. Patient stated today that he is ready to die. Hospice referral was made and he was accepted to the hospice home for later today. 5. Diarrhea. Stool studies were negative 6. Severe protein calorie malnutrition 7. Pancytopenia with history of cancer  DISCHARGE CONDITIONS:   Guarded  CONSULTS  OBTAINED:  Treatment Team:  Lloyd Huger, MD  DRUG ALLERGIES:  No Known Allergies  DISCHARGE MEDICATIONS:   Current Discharge Medication List    START taking these medications   Details  albuterol (PROVENTIL) (2.5 MG/3ML) 0.083% nebulizer solution Take 3 mLs (2.5 mg total) by nebulization every 2 (two) hours as needed for wheezing. Qty: 75 mL, Refills: 12    benzonatate (TESSALON) 100 MG capsule Take 1 capsule (100 mg total) by mouth 3 (three) times daily. Qty: 20 capsule, Refills: 0    feeding supplement, ENSURE ENLIVE, (ENSURE ENLIVE) LIQD Take 237 mLs by mouth 2 (two) times daily between meals. Qty: 60 Bottle, Refills: 0    glycopyrrolate (ROBINUL) 1 MG tablet Take 1 tablet (1 mg total) by mouth every 4 (four) hours as needed (excessive secretions). Qty: 20 tablet, Refills: 0    LORazepam (ATIVAN) 2 MG/ML concentrated solution Take 0.1 mLs (0.2 mg total) by mouth every 6 (six) hours as needed for anxiety or sleep. Qty: 30 mL, Refills: 0    Morphine Sulfate (MORPHINE CONCENTRATE) 10 MG/0.5ML SOLN concentrated solution Take 0.25 mLs (5 mg total) by mouth every 2 (two) hours as needed for severe pain or shortness of breath. Qty: 30 mL, Refills: 0    ondansetron (ZOFRAN) 4 MG tablet Take 1 tablet (4 mg total) by mouth every 6 (six) hours as needed for nausea. Qty: 20 tablet, Refills: 0      CONTINUE these medications which have NOT CHANGED   Details  Fluticasone-Salmeterol (ADVAIR DISKUS) 100-50 MCG/DOSE AEPB Inhale 1 puff into the lungs 2 (two) times daily. Qty:  60 each, Refills: 2      STOP taking these medications     albuterol (PROVENTIL HFA;VENTOLIN HFA) 108 (90 Base) MCG/ACT inhaler      esomeprazole (NEXIUM) 40 MG capsule      carvedilol (COREG) 3.125 MG tablet      dexamethasone (DECADRON) 4 MG tablet      HYDROcodone-acetaminophen (NORCO) 5-325 MG tablet      potassium chloride (K-DUR) 10 MEQ tablet      senna-docusate (SENOKOT-S) 8.6-50 MG tablet           DISCHARGE INSTRUCTIONS:   Discharged to the hospice home  If you experience worsening of your admission symptoms, develop shortness of breath, life threatening emergency, suicidal or homicidal thoughts you must seek medical attention immediately by calling 911 or calling your MD immediately  if symptoms less severe.  You Must read complete instructions/literature along with all the possible adverse reactions/side effects for all the Medicines you take and that have been prescribed to you. Take any new Medicines after you have completely understood and accept all the possible adverse reactions/side effects.   Please note  You were cared for by a hospitalist during your hospital stay. If you have any questions about your discharge medications or the care you received while you were in the hospital after you are discharged, you can call the unit and asked to speak with the hospitalist on call if the hospitalist that took care of you is not available. Once you are discharged, your primary care physician will handle any further medical issues. Please note that NO REFILLS for any discharge medications will be authorized once you are discharged, as it is imperative that you return to your primary care physician (or establish a relationship with a primary care physician if you do not have one) for your aftercare needs so that they can reassess your need for medications and monitor your lab values.    Today   CHIEF COMPLAINT:   Chief Complaint  Patient presents with  . Shortness of Breath    HISTORY OF PRESENT ILLNESS:  [name]  is a 76 y.o. male presented with shortness of breath and found to have multifocal pneumonia   VITAL SIGNS:  Blood pressure 121/78, pulse (!) 112, temperature 97.8 F (36.6 C), temperature source Oral, resp. rate 20, height 6\' 2"  (1.88 m), weight 60.8 kg (134 lb), SpO2 100 %.    PHYSICAL EXAMINATION:  GENERAL:  76 y.o.-year-old patient lying in the  bed with no acute distress.  EYES: Pupils equal, round, reactive to light and accommodation. No scleral icterus. Extraocular muscles intact.  HEENT: Head atraumatic, normocephalic. Oropharynx and nasopharynx clear.  NECK:  Supple, no jugular venous distention. No thyroid enlargement, no tenderness.  LUNGS: Decreased breath sounds bilaterally, scattered wheezing. No rales,rhonchi or crepitation. Positive use of accessory muscles of respiration.  CARDIOVASCULAR: S1, S2 normal. No murmurs, rubs, or gallops.  ABDOMEN: Soft, non-tender, non-distended. Bowel sounds present. No organomegaly or mass.  EXTREMITIES: No pedal edema, cyanosis, or clubbing.  NEUROLOGIC: Cranial nerves II through XII are intact. Muscle strength 5/5 in all extremities. Sensation intact. Gait not checked.  PSYCHIATRIC: The patient is alert and oriented x 3.  SKIN: No obvious rash, lesion, or ulcer.   DATA REVIEW:   CBC  Recent Labs Lab 10/06/16 1604  WBC 1.4*  HGB 9.7*  HCT 29.6*  PLT 57*    Chemistries   Recent Labs Lab 10/05/16 1613  10/07/16 0444  NA 138  < >  137  K 4.4  < > 3.5  CL 101  < > 101  CO2 22  < > 27  GLUCOSE 280*  < > 70  BUN 33*  < > 14  CREATININE 0.98  < > 0.57*  CALCIUM 9.1  < > 8.2*  AST 44*  --   --   ALT 29  --   --   ALKPHOS 240*  --   --   BILITOT 1.1  --   --   < > = values in this interval not displayed.  Cardiac Enzymes  Recent Labs Lab 10/05/16 1613  TROPONINI 0.03*    Microbiology Results  Results for orders placed or performed during the hospital encounter of 10/05/16  Blood Culture (routine x 2)     Status: None (Preliminary result)   Collection Time: 10/05/16  4:13 PM  Result Value Ref Range Status   Specimen Description BLOOD RIGHT ANTECUBITAL  Final   Special Requests   Final    BOTTLES DRAWN AEROBIC AND ANAEROBIC Blood Culture adequate volume   Culture NO GROWTH 2 DAYS  Final   Report Status PENDING  Incomplete  Blood Culture (routine x 2)     Status:  None (Preliminary result)   Collection Time: 10/05/16  4:13 PM  Result Value Ref Range Status   Specimen Description BLOOD PICC LINE  Final   Special Requests   Final    BOTTLES DRAWN AEROBIC AND ANAEROBIC Blood Culture adequate volume   Culture NO GROWTH 2 DAYS  Final   Report Status PENDING  Incomplete  Urine culture     Status: None   Collection Time: 10/05/16 11:22 PM  Result Value Ref Range Status   Specimen Description URINE, RANDOM  Final   Special Requests NONE  Final   Culture   Final    NO GROWTH Performed at [facility], [place] [address], [place], [place] [address]    Report Status 10/07/2016 FINAL  Final  MRSA PCR Screening     Status: None   Collection Time: 10/05/16 11:22 PM  Result Value Ref Range Status   MRSA by PCR NEGATIVE NEGATIVE Final    Comment:        The GeneXpert MRSA Assay (FDA approved for NASAL specimens only), is one component of a comprehensive MRSA colonization surveillance program. It is not intended to diagnose MRSA infection nor to guide or monitor treatment for MRSA infections.   Gastrointestinal Panel by PCR , Stool     Status: None   Collection Time: 10/06/16 12:21 PM  Result Value Ref Range Status   Campylobacter species NOT DETECTED NOT DETECTED Final   Plesimonas shigelloides NOT DETECTED NOT DETECTED Final   Salmonella species NOT DETECTED NOT DETECTED Final   Yersinia enterocolitica NOT DETECTED NOT DETECTED Final   Vibrio species NOT DETECTED NOT DETECTED Final   Vibrio cholerae NOT DETECTED NOT DETECTED Final   Enteroaggregative E coli (EAEC) NOT DETECTED NOT DETECTED Final   Enteropathogenic E coli (EPEC) NOT DETECTED NOT DETECTED Final   Enterotoxigenic E coli (ETEC) NOT DETECTED NOT DETECTED Final   Shiga like toxin producing E coli (STEC) NOT DETECTED NOT DETECTED Final   Shigella/Enteroinvasive E coli (EIEC) NOT DETECTED NOT DETECTED Final   Cryptosporidium NOT DETECTED NOT DETECTED Final   Cyclospora cayetanensis  NOT DETECTED NOT DETECTED Final   Entamoeba histolytica NOT DETECTED NOT DETECTED Final   Giardia lamblia NOT DETECTED NOT DETECTED Final   Adenovirus F40/41 NOT  DETECTED NOT DETECTED Final   Astrovirus NOT DETECTED NOT DETECTED Final   Norovirus GI/GII NOT DETECTED NOT DETECTED Final   Rotavirus A NOT DETECTED NOT DETECTED Final   Sapovirus (I, II, IV, and V) NOT DETECTED NOT DETECTED Final  C difficile quick scan w PCR reflex     Status: None   Collection Time: 10/06/16 12:21 PM  Result Value Ref Range Status   C Diff antigen NEGATIVE NEGATIVE Final   C Diff toxin NEGATIVE NEGATIVE Final   C Diff interpretation No C. difficile detected.  Final    RADIOLOGY:  Ct Angio Chest Pe W And/or Wo Contrast  Result Date: 10/05/2016 CLINICAL DATA:  Dyspnea in diaphoresis history of lung cancer. Shortness of breath. EXAM: CT ANGIOGRAPHY CHEST WITH CONTRAST TECHNIQUE: Multidetector CT imaging of the chest was performed using the standard protocol during bolus administration of intravenous contrast. Multiplanar CT image reconstructions and MIPs were obtained to evaluate the vascular anatomy. CONTRAST:  75 cc Isovue 370 intravenously. COMPARISON:  Body CT 08/20/2016 FINDINGS: Cardiovascular: Satisfactory opacification of the pulmonary arteries to the segmental level. No evidence of pulmonary embolism. Normal heart size. No pericardial effusion. Calcific atherosclerotic disease of the aorta and coronary arteries. Mediastinum/Nodes: Mild right mediastinal lymphadenopathy. Left IJ approach central venous catheter terminates at the cavoatrial junction. Lungs/Pleura: Interval development of thick-walled cavitation within the known right upper lobe mass versus airspace consolidation. The area of cavitation measures up to 7.2 cm. Extensive patchy and ground-glass airspace consolidations in the surrounding lung parenchyma. There has been also interval development of any predominantly peribronchovascular ground-glass  nodularity in the aerated portion of the right lower lobe. Peribronchial ground-glass opacities are seen to a lesser degree throughout the left lung. Stable left upper lobe 1 cm solid pulmonary nodule. Left lower lobe subpleural pulmonary nodule, stable at 8 mm. Upper Abdomen: Known widespread hepatic metastatic disease is poorly visualized due to the arterial phase of the study. Bilateral renal cysts. Musculoskeletal: Stable heterogeneous attenuation of the posterior aspect of T10 vertebral body, nonspecific. Review of the MIP images confirms the above findings. IMPRESSION: Interval development of large thick-walled cavitation within the known right upper lobe mass/airspace consolidation, and extensive bronchial thickening and predominantly peribronchovascular ground-glass nodularity throughout the right lung and less likely throughout the left lower lobe. Findings may represent advancement of malignancy with lymphangitic spread of disease versus superimposed development of multifocal pneumonia. Stable appearance of left-sided pulmonary nodules. Stable, considering difference in technique, hepatic metastatic disease. Aortic Atherosclerosis (ICD10-I70.0). Electronically Signed   By: Fidela Salisbury M.D.   On: 10/05/2016 17:50   Dg Chest Port 1 View  Result Date: 10/05/2016 CLINICAL DATA:  Shortness of breath and chest pain. History of colon cancer. EXAM: PORTABLE CHEST 1 VIEW COMPARISON:  09/10/2016 FINDINGS: Left jugular Port-A-Cath terminates over the mid SVC, unchanged. The cardiac silhouette is normal in size. Aortic atherosclerosis is noted. There is chronic right upper lobe volume loss with consolidative opacity at the apex and right hilar retraction, not significantly changed. An 11 mm nodule in the left mid lung is unchanged. No acute airspace consolidation, sizeable pleural effusion, or pneumothorax is identified. IMPRESSION: 1. Chronic right upper lobe volume loss without evidence of acute  cardiopulmonary process. 2. Unchanged left mid lung nodule. Electronically Signed   By: Logan Bores M.D.   On: 10/05/2016 16:28     Management plans discussed with the patient, family and they are in agreement.  CODE STATUS:     Code Status Orders  Start     Ordered   10/07/16 1007  Do not attempt resuscitation (DNR)  Continuous    Question Answer Comment  In the event of cardiac or respiratory ARREST Do not call a "code blue"   In the event of cardiac or respiratory ARREST Do not perform Intubation, CPR, defibrillation or ACLS   In the event of cardiac or respiratory ARREST Use medication by any route, position, wound care, and other measures to relive pain and suffering. May use oxygen, suction and manual treatment of airway obstruction as needed for comfort.      10/07/16 1007    Code Status History    Date Active Date Inactive Code Status Order ID Comments User Context   10/05/2016  6:10 PM 10/07/2016 10:07 AM DNR [id number]  Hillary Bow, MD ED   10/05/2016  5:53 PM 10/05/2016  6:10 PM Full Code [id number]  Hillary Bow, MD ED   05/10/2016  3:54 PM 05/13/2016  3:50 PM Full Code [id number]  Demetrios Loll, MD Inpatient   11/24/2015  3:32 AM 11/25/2015  5:46 PM Full Code [id number]  Harrie Foreman, MD Inpatient      TOTAL TIME TAKING CARE OF THIS PATIENT: 35 minutes.    Loletha Grayer M.D on 10/07/2016 at 2:15 PM  Between 7am to 6pm - Pager - [phone]  After 6pm go to [email or web address] - password [email or web address]  [facility] Office  [phone]  CC: Primary care physician; Marden Noble, MD

## 2016-10-10 LAB — CULTURE, BLOOD (ROUTINE X 2)
CULTURE: NO GROWTH
CULTURE: NO GROWTH
Special Requests: ADEQUATE
Special Requests: ADEQUATE

## 2016-10-10 LAB — QUANTIFERON IN TUBE
QFT TB AG MINUS NIL VALUE: 0.02 IU/mL
QUANTIFERON MITOGEN VALUE: 0.9 IU/mL
QUANTIFERON TB AG VALUE: 0.13 IU/mL
QUANTIFERON TB GOLD: NEGATIVE
Quantiferon Nil Value: 0.11 IU/mL

## 2016-10-10 LAB — QUANTIFERON TB GOLD ASSAY (BLOOD)

## 2016-10-13 ENCOUNTER — Inpatient Hospital Stay: Payer: Medicare Other | Admitting: Oncology

## 2016-10-13 ENCOUNTER — Inpatient Hospital Stay: Payer: Medicare Other

## 2016-10-15 ENCOUNTER — Inpatient Hospital Stay: Payer: Medicare Other

## 2016-10-16 DEATH — deceased

## 2016-11-26 ENCOUNTER — Other Ambulatory Visit: Payer: Self-pay | Admitting: Nurse Practitioner

## 2018-01-11 IMAGING — CT NM PET TUM IMG RESTAG (PS) SKULL BASE T - THIGH
1 of 11 series · 1 of 25 positions shown · non-contrast
Comparison: PET-CT from 11/01/2013 and CT abdomen and pelvis from
03/19/2015.

CLINICAL DATA: Subsequent treatment strategy for colon cancer.

EXAM:
NUCLEAR MEDICINE PET SKULL BASE TO THIGH
TECHNIQUE: 10.97 mCi F-18 FDG was injected intravenously. Full-ring PET imaging
was performed from the skull base to thigh after the radiotracer. CT
data was obtained and used for attenuation correction and anatomic
localization.
FASTING BLOOD GLUCOSE:  Value: 68 mg/dl

[Series 3: ct wb 5.0 b30f · axial · 5.0mm · 0.98mm/px · 1 of 329 slices shown]
[im 329/329  brain]
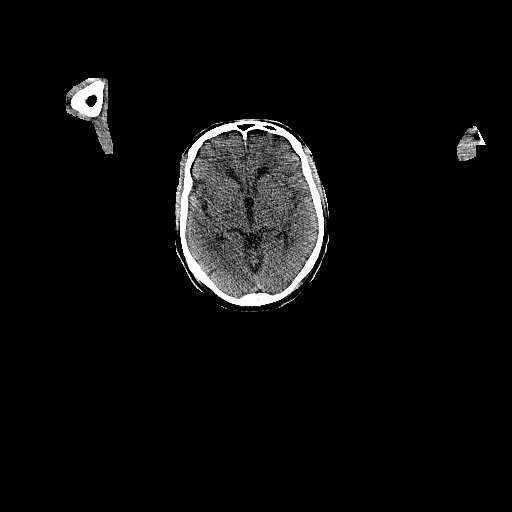

[1 of 25 positions shown; findings below may reference images not displayed]

FINDINGS: NECK

No hypermetabolic lymph nodes in the neck.

CHEST

No hypermetabolic mediastinal or hilar nodes. Chronic radiation
change within the right upper lobe and apex is again noted. No
specific findings identified to suggest residual or recurrent
hypermetabolic tumor. No significant FDG uptake is associated with
the perifissural nodule within the left midlung.

ABDOMEN/PELVIS

Multifocal hypermetabolic liver metastases identified. Index lesion
along the dome of liver measures 2.6 cm and has an SUV max equal to
7.66 peer index lesion adjacent to the falciform ligament measure
1.7 cm and has an SUV max equal to 8.69. Index lesion within the
posterior inferior right lobe of liver measure 2.1 cm and has an SUV
max equal to 8.3.

SKELETON

No focal hypermetabolic activity to suggest skeletal metastasis.
IMPRESSION: 1. Examination is positive for recurrent multifocal hypermetabolic
liver metastasis involving both lobes of liver.
2. No evidence for recurrent tumor within the chest.

## 2019-03-18 IMAGING — CR DG CHEST 1V
1 series · 1 of 1 positions shown · non-contrast
Comparison: Chest CT 08/20/2016

CLINICAL DATA: Shortness of breath and history of lung cancer

EXAM:
CHEST 1 VIEW

[w chest pa]
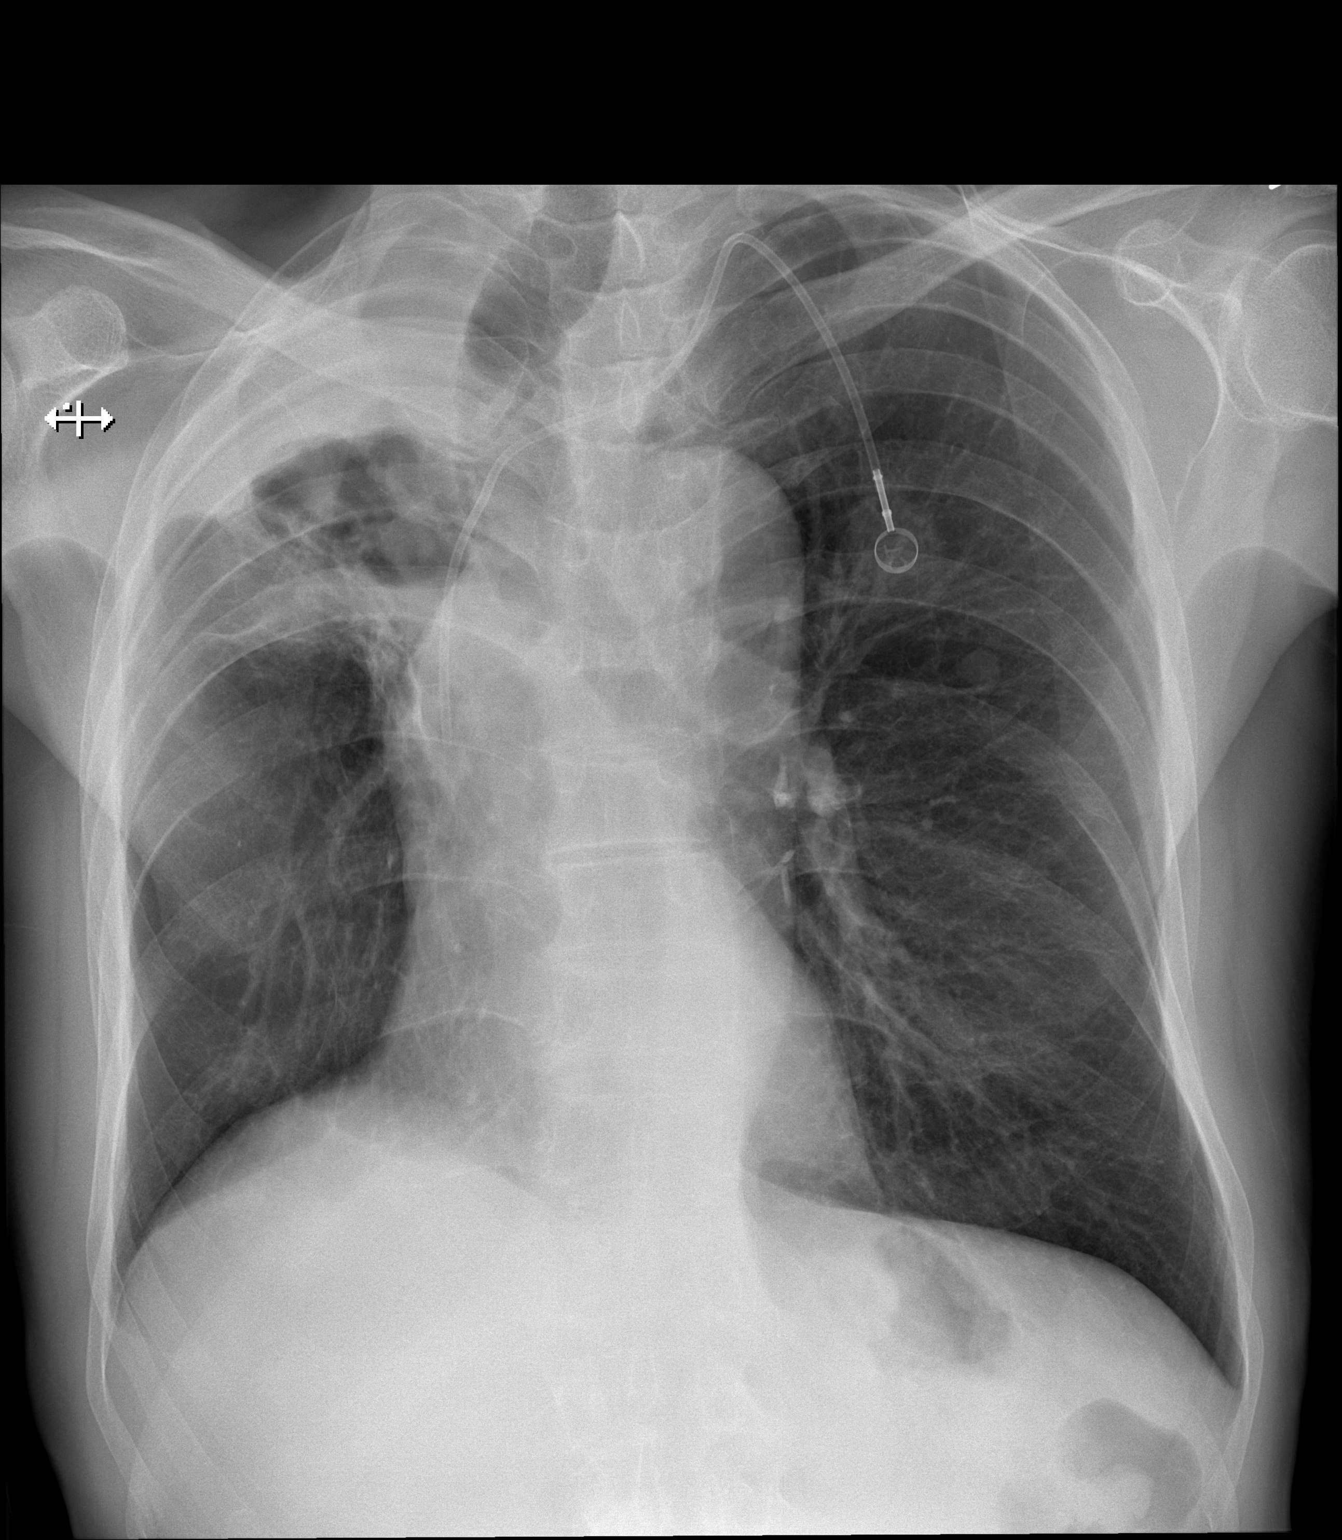

[1 of 1 positions shown; findings below may reference images not displayed]

FINDINGS: Left chest wall Port-A-Cath tip is in the lower SVC. There is a
loculated pleural fluid collection at the right lung apex. There is
atelectasis/ scarring of the right upper lobe. Otherwise, there is
no focal airspace consolidation. No pulmonary edema. There is a
nodule in the left mid lung by corresponds to a perifissural nodules
seen on the prior chest CT. No pleural effusion on the left.
IMPRESSION: 1. Loculated pleural fluid at the right lung apex with associated
scarring/atelectasis.
2. Nodule in the left mid lung, unchanged compared to recent chest
CT.
